# Patient Record
Sex: Female | Born: 1965 | Race: White | Hispanic: No | Marital: Single | Smoking: Current every day smoker
Health system: Southern US, Community
[De-identification: ages and names within clinical notes are randomized; demographics above are authoritative.]

## PROBLEM LIST (undated history)

## (undated) DIAGNOSIS — K2270 Barrett's esophagus without dysplasia: Secondary | ICD-10-CM

## (undated) DIAGNOSIS — R569 Unspecified convulsions: Secondary | ICD-10-CM

## (undated) DIAGNOSIS — I639 Cerebral infarction, unspecified: Secondary | ICD-10-CM

## (undated) DIAGNOSIS — R0600 Dyspnea, unspecified: Secondary | ICD-10-CM

## (undated) DIAGNOSIS — E559 Vitamin D deficiency, unspecified: Secondary | ICD-10-CM

## (undated) DIAGNOSIS — E785 Hyperlipidemia, unspecified: Secondary | ICD-10-CM

## (undated) DIAGNOSIS — R918 Other nonspecific abnormal finding of lung field: Secondary | ICD-10-CM

## (undated) DIAGNOSIS — N183 Chronic kidney disease, stage 3 unspecified: Secondary | ICD-10-CM

## (undated) DIAGNOSIS — N200 Calculus of kidney: Secondary | ICD-10-CM

## (undated) DIAGNOSIS — K219 Gastro-esophageal reflux disease without esophagitis: Secondary | ICD-10-CM

## (undated) DIAGNOSIS — M1990 Unspecified osteoarthritis, unspecified site: Secondary | ICD-10-CM

## (undated) DIAGNOSIS — Z87442 Personal history of urinary calculi: Secondary | ICD-10-CM

## (undated) HISTORY — DX: Vitamin D deficiency, unspecified: E55.9

## (undated) HISTORY — DX: Barrett's esophagus without dysplasia: K22.70

## (undated) HISTORY — PX: BREAST EXCISIONAL BIOPSY: SUR124

## (undated) HISTORY — DX: Chronic kidney disease, stage 3 unspecified: N18.30

## (undated) HISTORY — DX: Hyperlipidemia, unspecified: E78.5

## (undated) HISTORY — DX: Other nonspecific abnormal finding of lung field: R91.8

## (undated) HISTORY — DX: Gastro-esophageal reflux disease without esophagitis: K21.9

## (undated) HISTORY — DX: Cerebral infarction, unspecified: I63.9

## (undated) HISTORY — DX: Calculus of kidney: N20.0

## (undated) HISTORY — DX: Unspecified convulsions: R56.9

## (undated) HISTORY — PX: KNEE SURGERY: SHX244

## (undated) HISTORY — PX: HIP SURGERY: SHX245

---

## 2009-02-08 ENCOUNTER — Emergency Department (HOSPITAL_COMMUNITY): Admission: EM | Admit: 2009-02-08 | Discharge: 2009-02-08 | Payer: Self-pay | Admitting: Emergency Medicine

## 2014-12-09 DIAGNOSIS — R413 Other amnesia: Secondary | ICD-10-CM | POA: Insufficient documentation

## 2014-12-09 DIAGNOSIS — F411 Generalized anxiety disorder: Secondary | ICD-10-CM | POA: Insufficient documentation

## 2014-12-09 DIAGNOSIS — R569 Unspecified convulsions: Secondary | ICD-10-CM | POA: Insufficient documentation

## 2014-12-09 DIAGNOSIS — F3341 Major depressive disorder, recurrent, in partial remission: Secondary | ICD-10-CM | POA: Insufficient documentation

## 2014-12-09 DIAGNOSIS — F445 Conversion disorder with seizures or convulsions: Secondary | ICD-10-CM | POA: Insufficient documentation

## 2015-12-24 DIAGNOSIS — Z87898 Personal history of other specified conditions: Secondary | ICD-10-CM | POA: Insufficient documentation

## 2015-12-24 DIAGNOSIS — Z720 Tobacco use: Secondary | ICD-10-CM | POA: Insufficient documentation

## 2018-02-20 DIAGNOSIS — G43009 Migraine without aura, not intractable, without status migrainosus: Secondary | ICD-10-CM | POA: Insufficient documentation

## 2018-04-30 LAB — PULMONARY FUNCTION TEST

## 2020-02-10 ENCOUNTER — Encounter: Payer: Self-pay | Admitting: Family Medicine

## 2020-02-10 ENCOUNTER — Other Ambulatory Visit: Payer: Self-pay

## 2020-02-10 ENCOUNTER — Ambulatory Visit (INDEPENDENT_AMBULATORY_CARE_PROVIDER_SITE_OTHER): Payer: Medicare HMO | Admitting: Family Medicine

## 2020-02-10 VITALS — BP 117/69 | HR 95 | Temp 97.5°F | Ht 62.0 in | Wt 239.0 lb

## 2020-02-10 DIAGNOSIS — K219 Gastro-esophageal reflux disease without esophagitis: Secondary | ICD-10-CM | POA: Diagnosis not present

## 2020-02-10 DIAGNOSIS — F3341 Major depressive disorder, recurrent, in partial remission: Secondary | ICD-10-CM

## 2020-02-10 DIAGNOSIS — G43009 Migraine without aura, not intractable, without status migrainosus: Secondary | ICD-10-CM | POA: Diagnosis not present

## 2020-02-10 DIAGNOSIS — Z87898 Personal history of other specified conditions: Secondary | ICD-10-CM

## 2020-02-10 DIAGNOSIS — Z1211 Encounter for screening for malignant neoplasm of colon: Secondary | ICD-10-CM

## 2020-02-10 DIAGNOSIS — Z0000 Encounter for general adult medical examination without abnormal findings: Secondary | ICD-10-CM

## 2020-02-10 DIAGNOSIS — Z8673 Personal history of transient ischemic attack (TIA), and cerebral infarction without residual deficits: Secondary | ICD-10-CM | POA: Diagnosis not present

## 2020-02-10 DIAGNOSIS — Z720 Tobacco use: Secondary | ICD-10-CM

## 2020-02-10 MED ORDER — LAMOTRIGINE 200 MG PO TABS: 200.0000 mg | ORAL_TABLET | Freq: Every day | ORAL | 1 refills | Status: DC

## 2020-02-10 MED ORDER — OMEPRAZOLE 40 MG PO CPDR: 40.0000 mg | DELAYED_RELEASE_CAPSULE | Freq: Every day | ORAL | 1 refills | Status: DC

## 2020-02-10 MED ORDER — NORTRIPTYLINE HCL 50 MG PO CAPS: 100.0000 mg | ORAL_CAPSULE | Freq: Every day | ORAL | 1 refills | Status: DC

## 2020-02-10 MED ORDER — ATORVASTATIN CALCIUM 10 MG PO TABS: 10.0000 mg | ORAL_TABLET | Freq: Every day | ORAL | 1 refills | Status: DC

## 2020-02-10 MED ORDER — GABAPENTIN 300 MG PO CAPS: 300.0000 mg | ORAL_CAPSULE | Freq: Every day | ORAL | 1 refills | Status: DC

## 2020-02-10 NOTE — Progress Notes (Signed)
New Patient Office Visit  Assessment & Plan:  1. History of seizure - Discussed with patient that I could fill her medications as long as she was seizure free. If she has another seizure, she will need to return to the neurologist.  - lamoTRIgine (LAMICTAL) 200 MG tablet; Take 1 tablet (200 mg total) by mouth daily.  Dispense: 90 tablet; Refill: 1 - nortriptyline (PAMELOR) 50 MG capsule; Take 2 capsules (100 mg total) by mouth at bedtime.  Dispense: 180 capsule; Refill: 1 - Lamotrigine level - CBC with Differential/Platelet - CMP14+EGFR  2. Gastroesophageal reflux disease, unspecified whether esophagitis present - Well controlled on current regimen.  - omeprazole (PRILOSEC) 40 MG capsule; Take 1 capsule (40 mg total) by mouth daily.  Dispense: 90 capsule; Refill: 1  3. Migraine without aura and without status migrainosus, not intractable - Well controlled on current regimen.   4. Recurrent major depressive disorder, in partial remission ([place]) - Well controlled on current regimen.   5. History of stroke - Well controlled on current regimen.  - atorvastatin (LIPITOR) 10 MG tablet; Take 1 tablet (10 mg total) by mouth daily.  Dispense: 90 tablet; Refill: 1  6. Tobacco abuse - CMP14+EGFR - Lipid panel  7. Colon cancer screening - Cologuard  8. Healthcare maintenance - Patient would like to wait on TDAP as she may have had it done and it be in her records. Declined pap smear. She will call back to schedule her mammogram on the bus when she can coordinate transportation.    Follow-up: Return as directed after labs result.   Hendricks Limes, MSN, APRN, FNP-C [facility]  Subjective:  Patient ID: Maria Campbell, female    DOB: 01/22/1966  Age: 54 y.o. MRN: [id number]  Patient Care Team: Loman Brooklyn, FNP as PCP - General (Family Medicine)  CC:  Chief Complaint  Patient presents with  . New Patient (Initial Visit)    [facility]   . Establish  Care    HPI Kaylenn Civil presents to establish care. She is transferring care from [facility].   She would like to have seizure medications filled here instead of having to go to the neurologist all the time as she does not drive and has to pay her neighbor to take her places. She reports she has not had a seizure in at least 2 years.   Depression screen [facility] 2/9 02/10/2020  Decreased Interest 3  Down, Depressed, Hopeless 3  PHQ - 2 Score 6  Altered sleeping 2  Tired, decreased energy 0  Change in appetite 3  Feeling bad or failure about yourself  1  Trouble concentrating 3  Moving slowly or fidgety/restless 0  Suicidal thoughts 0  PHQ-9 Score 15  Difficult doing work/chores Not difficult at all    Review of Systems  Constitutional: Negative for chills, fever, malaise/fatigue and weight loss.  HENT: Negative for congestion, ear discharge, ear pain, nosebleeds, sinus pain, sore throat and tinnitus.   Eyes: Negative for blurred vision, double vision, pain, discharge and redness.  Respiratory: Negative for cough, shortness of breath and wheezing.   Cardiovascular: Negative for chest pain, palpitations and leg swelling.  Gastrointestinal: Negative for abdominal pain, constipation, diarrhea, heartburn, nausea and vomiting.  Genitourinary: Negative for dysuria, frequency and urgency.  Musculoskeletal: Negative for myalgias.  Skin: Negative for rash.  Neurological: Negative for dizziness, seizures, weakness and headaches.  Psychiatric/Behavioral: Negative for depression, substance abuse and suicidal ideas. The patient is not nervous/anxious.  Current Outpatient Medications:  .  aspirin 81 MG EC tablet, Take 1 tablet by mouth daily., Disp: , Rfl:  .  atorvastatin (LIPITOR) 10 MG tablet, Take 1 tablet (10 mg total) by mouth daily., Disp: 90 tablet, Rfl: 1 .  gabapentin (NEURONTIN) 300 MG capsule, Take 1 capsule (300 mg total) by mouth daily., Disp: 90 capsule, Rfl: 1 .   lamoTRIgine (LAMICTAL) 200 MG tablet, Take 1 tablet (200 mg total) by mouth daily., Disp: 90 tablet, Rfl: 1 .  nortriptyline (PAMELOR) 50 MG capsule, Take 2 capsules (100 mg total) by mouth at bedtime., Disp: 180 capsule, Rfl: 1 .  omeprazole (PRILOSEC) 40 MG capsule, Take 1 capsule (40 mg total) by mouth daily., Disp: 90 capsule, Rfl: 1 .  ondansetron (ZOFRAN) 4 MG tablet, Take 4 mg by mouth every 8 (eight) hours as needed for nausea or vomiting., Disp: , Rfl:   Allergies  Allergen Reactions  . Meperidine Nausea And Vomiting and Hives    Fainting and n/v (Demerol) Fainting and n/v (Demerol)   . Other Hives    Past Medical History:  Diagnosis Date  . Hyperlipidemia   . Kidney stones   . Seizures ([place])   . Stroke [facility])     Past Surgical History:  Procedure Laterality Date  . HIP SURGERY Left   . KNEE SURGERY Left     Family History  Problem Relation Age of Onset  . Heart disease Mother   . Heart disease Father   . Heart disease Maternal Grandmother   . Heart disease Maternal Grandfather   . Diabetes Paternal Grandmother     Social History   Socioeconomic History  . Marital status: Single    Spouse name: Not on file  . Number of children: Not on file  . Years of education: Not on file  . Highest education level: Not on file  Occupational History  . Not on file  Tobacco Use  . Smoking status: Current Every Day Smoker    Packs/day: 1.00    Types: Cigarettes  . Smokeless tobacco: Never Used  Substance and Sexual Activity  . Alcohol use: Never  . Drug use: Never  . Sexual activity: Not Currently    Birth control/protection: None  Other Topics Concern  . Not on file  Social History Narrative  . Not on file   Social Determinants of Health   Financial Resource Strain:   . Difficulty of Paying Living Expenses:   Food Insecurity:   . Worried About Charity fundraiser in the Last Year:   . Arboriculturist in the Last Year:   Transportation Needs:   . Lexicographer (Medical):   Marland Kitchen Lack of Transportation (Non-Medical):   Physical Activity:   . Days of Exercise per Week:   . Minutes of Exercise per Session:   Stress:   . Feeling of Stress :   Social Connections:   . Frequency of Communication with Friends and Family:   . Frequency of Social Gatherings with Friends and Family:   . Attends Religious Services:   . Active Member of Clubs or Organizations:   . Attends Archivist Meetings:   Marland Kitchen Marital Status:   Intimate Partner Violence:   . Fear of Current or Ex-Partner:   . Emotionally Abused:   Marland Kitchen Physically Abused:   . Sexually Abused:     Objective:   Today's Vitals: BP 117/69   Pulse 95   Temp (!) 97.5 F (  36.4 C) (Temporal)   Ht 5' 2"  (1.575 m)   Wt 239 lb (108.4 kg)   SpO2 96%   BMI 43.71 kg/m   Physical Exam Vitals reviewed.  Constitutional:      General: She is not in acute distress.    Appearance: Normal appearance. She is morbidly obese. She is not ill-appearing, toxic-appearing or diaphoretic.  HENT:     Head: Normocephalic and atraumatic.  Eyes:     General: No scleral icterus.       Right eye: No discharge.        Left eye: No discharge.     Conjunctiva/sclera: Conjunctivae normal.  Cardiovascular:     Rate and Rhythm: Normal rate and regular rhythm.     Heart sounds: Normal heart sounds. No murmur. No friction rub. No gallop.   Pulmonary:     Effort: Pulmonary effort is normal. No respiratory distress.     Breath sounds: Normal breath sounds. No stridor. No wheezing, rhonchi or rales.  Musculoskeletal:        General: Normal range of motion.     Cervical back: Normal range of motion.  Skin:    General: Skin is warm and dry.     Capillary Refill: Capillary refill takes less than 2 seconds.  Neurological:     General: No focal deficit present.     Mental Status: She is alert and oriented to person, place, and time. Mental status is at baseline.  Psychiatric:        Mood and Affect: Mood  normal.        Behavior: Behavior normal.        Thought Content: Thought content normal.        Judgment: Judgment normal.

## 2020-02-11 LAB — CBC WITH DIFFERENTIAL/PLATELET
Basophils Absolute: 0 10*3/uL (ref 0.0–0.2)
Basos: 1 %
EOS (ABSOLUTE): 0.1 10*3/uL (ref 0.0–0.4)
Eos: 2 %
Hematocrit: 41.4 % (ref 34.0–46.6)
Hemoglobin: 13 g/dL (ref 11.1–15.9)
Immature Grans (Abs): 0 10*3/uL (ref 0.0–0.1)
Immature Granulocytes: 0 %
Lymphocytes Absolute: 1.9 10*3/uL (ref 0.7–3.1)
Lymphs: 27 %
MCH: 24.3 pg — ABNORMAL LOW (ref 26.6–33.0)
MCHC: 31.4 g/dL — ABNORMAL LOW (ref 31.5–35.7)
MCV: 77 fL — ABNORMAL LOW (ref 79–97)
Monocytes Absolute: 0.4 10*3/uL (ref 0.1–0.9)
Monocytes: 6 %
Neutrophils Absolute: 4.5 10*3/uL (ref 1.4–7.0)
Neutrophils: 64 %
Platelets: 211 10*3/uL (ref 150–450)
RBC: 5.35 x10E6/uL — ABNORMAL HIGH (ref 3.77–5.28)
RDW: 16.7 % — ABNORMAL HIGH (ref 11.7–15.4)
WBC: 7 10*3/uL (ref 3.4–10.8)

## 2020-02-11 LAB — CMP14+EGFR
ALT: 11 IU/L (ref 0–32)
AST: 12 IU/L (ref 0–40)
Albumin/Globulin Ratio: 1.7 (ref 1.2–2.2)
Albumin: 4.2 g/dL (ref 3.8–4.9)
Alkaline Phosphatase: 114 IU/L (ref 39–117)
BUN/Creatinine Ratio: 11 (ref 9–23)
BUN: 13 mg/dL (ref 6–24)
Bilirubin Total: <0.2 mg/dL (ref 0.0–1.2)
CO2: 23 mmol/L (ref 20–29)
Calcium: 9.5 mg/dL (ref 8.7–10.2)
Chloride: 100 mmol/L (ref 96–106)
Creatinine, Ser: 1.14 mg/dL — ABNORMAL HIGH (ref 0.57–1.00)
GFR calc Af Amer: 63 mL/min/{1.73_m2} (ref >59)
GFR calc non Af Amer: 55 mL/min/{1.73_m2} — ABNORMAL LOW (ref >59)
Globulin, Total: 2.5 g/dL (ref 1.5–4.5)
Glucose: 113 mg/dL — ABNORMAL HIGH (ref 65–99)
Potassium: 3.8 mmol/L (ref 3.5–5.2)
Sodium: 138 mmol/L (ref 134–144)
Total Protein: 6.7 g/dL (ref 6.0–8.5)

## 2020-02-11 LAB — LIPID PANEL
Chol/HDL Ratio: 3.6 ratio (ref 0.0–4.4)
Cholesterol, Total: 176 mg/dL (ref 100–199)
HDL: 49 mg/dL (ref >39)
LDL Chol Calc (NIH): 103 mg/dL — ABNORMAL HIGH (ref 0–99)
Triglycerides: 136 mg/dL (ref 0–149)
VLDL Cholesterol Cal: 24 mg/dL (ref 5–40)

## 2020-02-11 LAB — LAMOTRIGINE LEVEL: Lamotrigine Lvl: 3.8 ug/mL (ref 2.0–20.0)

## 2020-04-07 ENCOUNTER — Encounter: Payer: Self-pay | Admitting: Family Medicine

## 2020-05-11 ENCOUNTER — Other Ambulatory Visit: Payer: Self-pay | Admitting: Family Medicine

## 2020-08-09 ENCOUNTER — Other Ambulatory Visit: Payer: Self-pay | Admitting: Family Medicine

## 2020-08-09 DIAGNOSIS — Z87898 Personal history of other specified conditions: Secondary | ICD-10-CM

## 2020-08-10 ENCOUNTER — Other Ambulatory Visit: Payer: Self-pay

## 2020-08-10 ENCOUNTER — Ambulatory Visit (INDEPENDENT_AMBULATORY_CARE_PROVIDER_SITE_OTHER): Payer: Medicare HMO | Admitting: Nurse Practitioner

## 2020-08-10 ENCOUNTER — Encounter: Payer: Self-pay | Admitting: Nurse Practitioner

## 2020-08-10 VITALS — BP 136/72 | HR 84 | Temp 96.9°F | Resp 20 | Ht 62.0 in | Wt 236.0 lb

## 2020-08-10 DIAGNOSIS — R300 Dysuria: Secondary | ICD-10-CM | POA: Insufficient documentation

## 2020-08-10 LAB — URINALYSIS, COMPLETE
Bilirubin, UA: NEGATIVE
Glucose, UA: NEGATIVE
Ketones, UA: NEGATIVE
Nitrite, UA: NEGATIVE
Specific Gravity, UA: 1.015 (ref 1.005–1.030)
Urobilinogen, Ur: 1 mg/dL (ref 0.2–1.0)
pH, UA: 7 (ref 5.0–7.5)

## 2020-08-10 LAB — MICROSCOPIC EXAMINATION
Bacteria, UA: NONE SEEN
WBC, UA: NONE SEEN /hpf (ref 0–5)

## 2020-08-10 MED ORDER — NITROFURANTOIN MONOHYD MACRO 100 MG PO CAPS: 100.0000 mg | ORAL_CAPSULE | Freq: Two times a day (BID) | ORAL | 0 refills | Status: DC

## 2020-08-10 NOTE — Progress Notes (Signed)
Acute Office Visit  Subjective:    Patient ID: Maria Campbell, female    DOB: 1965/12/22, 54 y.o.   MRN: [id number]  Chief Complaint  Patient presents with  . Urinary Tract Infection    dysuria, urinary frequency    Urinary Tract Infection  This is a recurrent problem. The current episode started more than 1 month ago. The problem occurs every urination. The problem has been unchanged. The pain is at a severity of 6/10. There has been no fever. She is not sexually active. There is a history of pyelonephritis. Associated symptoms include nausea and vomiting. Pertinent negatives include no chills, discharge, flank pain or hematuria. Associated symptoms comments: Dysuria. She has tried nothing for the symptoms. Her past medical history is significant for kidney stones.     Past Medical History:  Diagnosis Date  . Barrett's esophagus   . CKD (chronic kidney disease) stage 3, GFR 30-59 ml/min   . GERD (gastroesophageal reflux disease)   . Hyperlipidemia   . Kidney stones   . Lung mass    PET negative on 05/22/18  . Seizures ([facility])   . Stroke ([facility])   . Vitamin D deficiency     Past Surgical History:  Procedure Laterality Date  . HIP SURGERY Left   . KNEE SURGERY Left     Family History  Problem Relation Age of Onset  . Heart disease Mother   . Heart attack Mother   . Heart disease Father   . Heart failure Father   . Heart disease Maternal Grandmother   . Heart disease Maternal Grandfather   . Diabetes Paternal Grandmother     Social History   Socioeconomic History  . Marital status: Single    Spouse name: Not on file  . Number of children: Not on file  . Years of education: Not on file  . Highest education level: Not on file  Occupational History  . Not on file  Tobacco Use  . Smoking status: Current Every Day Smoker    Packs/day: 1.00    Types: Cigarettes  . Smokeless tobacco: Never Used  Vaping Use  . Vaping Use: Never used  Substance and Sexual Activity  .  Alcohol use: Never  . Drug use: Never  . Sexual activity: Not Currently    Birth control/protection: None  Other Topics Concern  . Not on file  Social History Narrative  . Not on file   Social Determinants of Health   Financial Resource Strain:   . Difficulty of Paying Living Expenses: Not on file  Food Insecurity:   . Worried About Programme researcher, broadcasting/film/video in the Last Year: Not on file  . Ran Out of Food in the Last Year: Not on file  Transportation Needs:   . Lack of Transportation (Medical): Not on file  . Lack of Transportation (Non-Medical): Not on file  Physical Activity:   . Days of Exercise per Week: Not on file  . Minutes of Exercise per Session: Not on file  Stress:   . Feeling of Stress : Not on file  Social Connections:   . Frequency of Communication with Friends and Family: Not on file  . Frequency of Social Gatherings with Friends and Family: Not on file  . Attends Religious Services: Not on file  . Active Member of Clubs or Organizations: Not on file  . Attends Banker Meetings: Not on file  . Marital Status: Not on file  Intimate Partner Violence:   .  Fear of Current or Ex-Partner: Not on file  . Emotionally Abused: Not on file  . Physically Abused: Not on file  . Sexually Abused: Not on file    Outpatient Medications Prior to Visit  Medication Sig Dispense Refill  . aspirin 81 MG EC tablet Take 1 tablet by mouth daily.    Marland Kitchen atorvastatin (LIPITOR) 10 MG tablet Take 1 tablet (10 mg total) by mouth daily. 90 tablet 1  . gabapentin (NEURONTIN) 300 MG capsule TAKE 1 CAPSULE(300 MG) BY MOUTH DAILY 90 capsule 1  . lamoTRIgine (LAMICTAL) 200 MG tablet Take 1 tablet (200 mg total) by mouth daily. (Needs to be seen before next refill) 30 tablet 0  . nortriptyline (PAMELOR) 50 MG capsule Take 2 capsules (100 mg total) by mouth at bedtime. (Needs to be seen before next refill) 60 capsule 0  . omeprazole (PRILOSEC) 40 MG capsule Take 1 capsule (40 mg total) by  mouth daily. 90 capsule 1  . ondansetron (ZOFRAN) 4 MG tablet Take 4 mg by mouth every 8 (eight) hours as needed for nausea or vomiting. (Patient not taking: Reported on 08/10/2020)     No facility-administered medications prior to visit.    Allergies  Allergen Reactions  . Meperidine Nausea And Vomiting and Hives    Fainting and n/v (Demerol) Fainting and n/v (Demerol)   . Other Hives    Review of Systems  Constitutional: Negative for chills.  Gastrointestinal: Positive for nausea and vomiting.  Genitourinary: Positive for dysuria. Negative for flank pain and hematuria.  All other systems reviewed and are negative.      Objective:    Physical Exam Constitutional:      Appearance: Normal appearance. She is obese.  HENT:     Head: Normocephalic.  Eyes:     Conjunctiva/sclera: Conjunctivae normal.  Cardiovascular:     Rate and Rhythm: Normal rate and regular rhythm.     Pulses: Normal pulses.     Heart sounds: Normal heart sounds.  Pulmonary:     Effort: Pulmonary effort is normal.     Breath sounds: Normal breath sounds.  Abdominal:     General: Bowel sounds are normal.  Genitourinary:    Vagina: No vaginal discharge.  Musculoskeletal:        General: Tenderness present.  Skin:    General: Skin is dry.     Findings: Rash present.  Neurological:     Mental Status: She is alert and oriented to person, place, and time.     BP 136/72   Pulse 84   Temp (!) 96.9 F (36.1 C) (Temporal)   Resp 20   Ht 5\' 2"  (1.575 m)   Wt 236 lb (107 kg)   SpO2 98%   BMI 43.16 kg/m  Wt Readings from Last 3 Encounters:  08/10/20 236 lb (107 kg)  02/10/20 239 lb (108.4 kg)    Health Maintenance Due  Topic Date Due  . Hepatitis C Screening  Never done  . COVID-19 Vaccine (1) Never done  . TETANUS/TDAP  Never done  . PAP SMEAR-Modifier  Never done  . MAMMOGRAM  Never done  . Fecal DNA (Cologuard)  Never done  . INFLUENZA VACCINE  Never done      No results found for:  TSH Lab Results  Component Value Date   WBC 7.0 02/10/2020   HGB 13.0 02/10/2020   HCT 41.4 02/10/2020   MCV 77 (L) 02/10/2020   PLT 211 02/10/2020   Lab Results  Component Value Date   NA 138 02/10/2020   K 3.8 02/10/2020   CO2 23 02/10/2020   GLUCOSE 113 (H) 02/10/2020   BUN 13 02/10/2020   CREATININE 1.14 (H) 02/10/2020   BILITOT <0.2 02/10/2020   ALKPHOS 114 02/10/2020   AST 12 02/10/2020   ALT 11 02/10/2020   PROT 6.7 02/10/2020   ALBUMIN 4.2 02/10/2020   CALCIUM 9.5 02/10/2020   Lab Results  Component Value Date   CHOL 176 02/10/2020   Lab Results  Component Value Date   HDL 49 02/10/2020   Lab Results  Component Value Date   LDLCALC 103 (H) 02/10/2020   Lab Results  Component Value Date   TRIG 136 02/10/2020   Lab Results  Component Value Date   CHOLHDL 3.6 02/10/2020   No results found for: HGBA1C     Assessment & Plan:  Dysuria Patient is a 54 year old female who presents to clinic with dysuria.  This is not new for patient but is been ongoing in the last 2 months.  Reports not coming to clinic because she was not having as much pain as she is having right now.  Patient reports a history of pyelonephritis and kidney stones in the past.  Patient is rating her pain today as 6-7 out of 10 on the pain scale of 0-10.  Patient is having difficulty giving a urine sample.  Patient denies blood in her urine, fever and foul odor urine.  Patient is not sexually active.  Reports nausea, vomiting one episode with stress incontinence.   Labs pending urinalysis/urine culture.  Problem List Items Addressed This Visit      Other   Dysuria - Primary   Relevant Orders   Urinalysis, Complete   Urine Culture       No orders of the defined types were placed in this encounter.    Daryll Drown, NP

## 2020-08-10 NOTE — Addendum Note (Signed)
Addended by: Daryll Drown on: 08/10/2020 10:38 AM   Modules accepted: Orders

## 2020-08-10 NOTE — Patient Instructions (Signed)

## 2020-08-10 NOTE — Assessment & Plan Note (Addendum)
Patient is a 54 year old female who presents to clinic with dysuria.  This is not new for patient but is been ongoing in the last 2 months.  Reports not coming to clinic because she was not having as much pain as she is having right now.  Patient reports a history of pyelonephritis and kidney stones in the past.  Patient is rating her pain today as 6-7 out of 10 on the pain scale of 0-10.  Patient is having difficulty giving a urine sample.  Patient denies blood in her urine, fever and foul odor urine.  Patient is not sexually active.  Reports nausea, vomiting one episode with stress incontinence.   Started patient on nitrofurantoin 100 mg twice daily pending urine cultures.  Labs pending urinalysis/urine culture.

## 2020-08-14 LAB — URINE CULTURE

## 2020-08-16 ENCOUNTER — Telehealth: Payer: Self-pay | Admitting: Family Medicine

## 2020-08-16 ENCOUNTER — Other Ambulatory Visit: Payer: Self-pay | Admitting: Nurse Practitioner

## 2020-08-16 MED ORDER — CIPROFLOXACIN HCL 500 MG PO TABS: 500.0000 mg | ORAL_TABLET | Freq: Two times a day (BID) | ORAL | 0 refills | Status: DC

## 2020-08-16 NOTE — Telephone Encounter (Signed)
Medication reordered and sent to [facility] pharmacy

## 2020-08-18 ENCOUNTER — Ambulatory Visit: Payer: Medicare HMO

## 2020-09-01 ENCOUNTER — Ambulatory Visit: Payer: Medicare HMO

## 2020-09-13 ENCOUNTER — Other Ambulatory Visit: Payer: Self-pay | Admitting: Family Medicine

## 2020-09-13 DIAGNOSIS — Z8673 Personal history of transient ischemic attack (TIA), and cerebral infarction without residual deficits: Secondary | ICD-10-CM

## 2020-09-14 ENCOUNTER — Other Ambulatory Visit: Payer: Self-pay | Admitting: Family Medicine

## 2020-09-14 DIAGNOSIS — Z8673 Personal history of transient ischemic attack (TIA), and cerebral infarction without residual deficits: Secondary | ICD-10-CM

## 2020-10-01 ENCOUNTER — Other Ambulatory Visit: Payer: Self-pay | Admitting: Family Medicine

## 2020-10-01 ENCOUNTER — Telehealth: Payer: Self-pay

## 2020-10-01 DIAGNOSIS — Z87898 Personal history of other specified conditions: Secondary | ICD-10-CM

## 2020-10-01 MED ORDER — NORTRIPTYLINE HCL 50 MG PO CAPS: 100.0000 mg | ORAL_CAPSULE | Freq: Every day | ORAL | 0 refills | Status: DC

## 2020-10-01 MED ORDER — LAMOTRIGINE 200 MG PO TABS: 200.0000 mg | ORAL_TABLET | Freq: Every day | ORAL | 0 refills | Status: DC

## 2020-10-01 NOTE — Telephone Encounter (Signed)
Appointment scheduled and refills sent.

## 2020-10-01 NOTE — Telephone Encounter (Signed)
  Prescription Request  10/01/2020  What is the name of the medication or equipment?  nortriptyline (PAMELOR) 50 MG capsule lamoTRIgine (LAMICTAL) 200 MG tablet   Have you contacted your pharmacy to request a refill? (if applicable) yes fax?  Which pharmacy would you like this sent to? [facility]   Patient notified that their request is being sent to the clinical staff for review and that they should receive a response within 2 business days.

## 2020-10-05 ENCOUNTER — Other Ambulatory Visit: Payer: Self-pay | Admitting: *Deleted

## 2020-10-05 DIAGNOSIS — K219 Gastro-esophageal reflux disease without esophagitis: Secondary | ICD-10-CM

## 2020-10-05 MED ORDER — OMEPRAZOLE 40 MG PO CPDR: 40.0000 mg | DELAYED_RELEASE_CAPSULE | Freq: Every day | ORAL | 0 refills | Status: DC

## 2020-10-08 ENCOUNTER — Encounter: Payer: Self-pay | Admitting: Nurse Practitioner

## 2020-10-08 ENCOUNTER — Other Ambulatory Visit: Payer: Self-pay

## 2020-10-08 ENCOUNTER — Ambulatory Visit (INDEPENDENT_AMBULATORY_CARE_PROVIDER_SITE_OTHER): Payer: Medicare HMO | Admitting: Nurse Practitioner

## 2020-10-08 VITALS — BP 120/81 | HR 93 | Temp 97.2°F | Ht 62.0 in | Wt 241.8 lb

## 2020-10-08 DIAGNOSIS — K219 Gastro-esophageal reflux disease without esophagitis: Secondary | ICD-10-CM

## 2020-10-08 DIAGNOSIS — Z87898 Personal history of other specified conditions: Secondary | ICD-10-CM | POA: Diagnosis not present

## 2020-10-08 DIAGNOSIS — F411 Generalized anxiety disorder: Secondary | ICD-10-CM

## 2020-10-08 DIAGNOSIS — E782 Mixed hyperlipidemia: Secondary | ICD-10-CM

## 2020-10-08 DIAGNOSIS — Z8673 Personal history of transient ischemic attack (TIA), and cerebral infarction without residual deficits: Secondary | ICD-10-CM

## 2020-10-08 LAB — COMPREHENSIVE METABOLIC PANEL
ALT: 13 IU/L (ref 0–32)
AST: 11 IU/L (ref 0–40)
Albumin/Globulin Ratio: 1.2 (ref 1.2–2.2)
Albumin: 3.6 g/dL — ABNORMAL LOW (ref 3.8–4.9)
Alkaline Phosphatase: 120 IU/L (ref 44–121)
BUN/Creatinine Ratio: 8 — ABNORMAL LOW (ref 9–23)
BUN: 10 mg/dL (ref 6–24)
Bilirubin Total: 0.2 mg/dL (ref 0.0–1.2)
CO2: 23 mmol/L (ref 20–29)
Calcium: 9.5 mg/dL (ref 8.7–10.2)
Chloride: 100 mmol/L (ref 96–106)
Creatinine, Ser: 1.21 mg/dL — ABNORMAL HIGH (ref 0.57–1.00)
GFR calc Af Amer: 58 mL/min/{1.73_m2} — ABNORMAL LOW (ref >59)
GFR calc non Af Amer: 50 mL/min/{1.73_m2} — ABNORMAL LOW (ref >59)
Globulin, Total: 3 g/dL (ref 1.5–4.5)
Glucose: 93 mg/dL (ref 65–99)
Potassium: 4.3 mmol/L (ref 3.5–5.2)
Sodium: 138 mmol/L (ref 134–144)
Total Protein: 6.6 g/dL (ref 6.0–8.5)

## 2020-10-08 LAB — LIPID PANEL
Chol/HDL Ratio: 4.7 ratio — ABNORMAL HIGH (ref 0.0–4.4)
Cholesterol, Total: 197 mg/dL (ref 100–199)
HDL: 42 mg/dL (ref >39)
LDL Chol Calc (NIH): 136 mg/dL — ABNORMAL HIGH (ref 0–99)
Triglycerides: 107 mg/dL (ref 0–149)
VLDL Cholesterol Cal: 19 mg/dL (ref 5–40)

## 2020-10-08 LAB — CBC WITH DIFFERENTIAL/PLATELET
Basophils Absolute: 0.1 10*3/uL (ref 0.0–0.2)
Basos: 1 %
EOS (ABSOLUTE): 0.2 10*3/uL (ref 0.0–0.4)
Eos: 3 %
Hematocrit: 38.1 % (ref 34.0–46.6)
Hemoglobin: 12.3 g/dL (ref 11.1–15.9)
Immature Grans (Abs): 0 10*3/uL (ref 0.0–0.1)
Immature Granulocytes: 0 %
Lymphocytes Absolute: 1.5 10*3/uL (ref 0.7–3.1)
Lymphs: 25 %
MCH: 25.4 pg — ABNORMAL LOW (ref 26.6–33.0)
MCHC: 32.3 g/dL (ref 31.5–35.7)
MCV: 79 fL (ref 79–97)
Monocytes Absolute: 0.5 10*3/uL (ref 0.1–0.9)
Monocytes: 8 %
Neutrophils Absolute: 3.9 10*3/uL (ref 1.4–7.0)
Neutrophils: 63 %
Platelets: 195 10*3/uL (ref 150–450)
RBC: 4.84 x10E6/uL (ref 3.77–5.28)
RDW: 15.5 % — ABNORMAL HIGH (ref 11.7–15.4)
WBC: 6.2 10*3/uL (ref 3.4–10.8)

## 2020-10-08 MED ORDER — ATORVASTATIN CALCIUM 10 MG PO TABS: 10.0000 mg | ORAL_TABLET | Freq: Every day | ORAL | 1 refills | Status: DC

## 2020-10-08 MED ORDER — NORTRIPTYLINE HCL 50 MG PO CAPS: ORAL_CAPSULE | ORAL | 1 refills | Status: DC

## 2020-10-08 MED ORDER — OMEPRAZOLE 40 MG PO CPDR: 40.0000 mg | DELAYED_RELEASE_CAPSULE | Freq: Every day | ORAL | 1 refills | Status: DC

## 2020-10-08 MED ORDER — GABAPENTIN 300 MG PO CAPS: ORAL_CAPSULE | ORAL | 1 refills | Status: DC

## 2020-10-08 MED ORDER — LAMOTRIGINE 200 MG PO TABS: 200.0000 mg | ORAL_TABLET | Freq: Every day | ORAL | 1 refills | Status: DC

## 2020-10-08 NOTE — Patient Instructions (Signed)
Generalized Anxiety Disorder, Adult Generalized anxiety disorder (GAD) is a mental health disorder. People with this condition constantly worry about everyday events. Unlike normal anxiety, worry related to GAD is not triggered by a specific event. These worries also do not fade or get better with time. GAD interferes with life functions, including relationships, work, and school. GAD can vary from mild to severe. People with severe GAD can have intense waves of anxiety with physical symptoms (panic attacks). What are the causes? The exact cause of GAD is not known. What increases the risk? This condition is more likely to develop in:  Women.  People who have a family history of anxiety disorders.  People who are very shy.  People who experience very stressful life events, such as the death of a loved one.  People who have a very stressful family environment. What are the signs or symptoms? People with GAD often worry excessively about many things in their lives, such as their health and family. They may also be overly concerned about:  Doing well at work.  Being on time.  Natural disasters.  Friendships. Physical symptoms of GAD include:  Fatigue.  Muscle tension or having muscle twitches.  Trembling or feeling shaky.  Being easily startled.  Feeling like your heart is pounding or racing.  Feeling out of breath or like you cannot take a deep breath.  Having trouble falling asleep or staying asleep.  Sweating.  Nausea, diarrhea, or irritable bowel syndrome (IBS).  Headaches.  Trouble concentrating or remembering facts.  Restlessness.  Irritability. How is this diagnosed? Your health care provider can diagnose GAD based on your symptoms and medical history. You will also have a physical exam. The health care provider will ask specific questions about your symptoms, including how severe they are, when they started, and if they come and go. Your health care  provider may ask you about your use of alcohol or drugs, including prescription medicines. Your health care provider may refer you to a mental health specialist for further evaluation. Your health care provider will do a thorough examination and may perform additional tests to rule out other possible causes of your symptoms. To be diagnosed with GAD, a person must have anxiety that:  Is out of his or her control.  Affects several different aspects of his or her life, such as work and relationships.  Causes distress that makes him or her unable to take part in normal activities.  Includes at least three physical symptoms of GAD, such as restlessness, fatigue, trouble concentrating, irritability, muscle tension, or sleep problems. Before your health care provider can confirm a diagnosis of GAD, these symptoms must be present more days than they are not, and they must last for six months or longer. How is this treated? The following therapies are usually used to treat GAD:  Medicine. Antidepressant medicine is usually prescribed for long-term daily control. Antianxiety medicines may be added in severe cases, especially when panic attacks occur.  Talk therapy (psychotherapy). Certain types of talk therapy can be helpful in treating GAD by providing support, education, and guidance. Options include: ? Cognitive behavioral therapy (CBT). People learn coping skills and techniques to ease their anxiety. They learn to identify unrealistic or negative thoughts and behaviors and to replace them with positive ones. ? Acceptance and commitment therapy (ACT). This treatment teaches people how to be mindful as a way to cope with unwanted thoughts and feelings. ? Biofeedback. This process trains you to manage your body's response (  physiological response) through breathing techniques and relaxation methods. You will work with a therapist while machines are used to monitor your physical symptoms.  Stress  management techniques. These include yoga, meditation, and exercise. A mental health specialist can help determine which treatment is best for you. Some people see improvement with one type of therapy. However, other people require a combination of therapies. Follow these instructions at home:  Take over-the-counter and prescription medicines only as told by your health care provider.  Try to maintain a normal routine.  Try to anticipate stressful situations and allow extra time to manage them.  Practice any stress management or self-calming techniques as taught by your health care provider.  Do not punish yourself for setbacks or for not making progress.  Try to recognize your accomplishments, even if they are small.  Keep all follow-up visits as told by your health care provider. This is important. Contact a health care provider if:  Your symptoms do not get better.  Your symptoms get worse.  You have signs of depression, such as: ? A persistently sad, cranky, or irritable mood. ? Loss of enjoyment in activities that used to bring you joy. ? Change in weight or eating. ? Changes in sleeping habits. ? Avoiding friends or family members. ? Loss of energy for normal tasks. ? Feelings of guilt or worthlessness. Get help right away if:  You have serious thoughts about hurting yourself or others. If you ever feel like you may hurt yourself or others, or have thoughts about taking your own life, get help right away. You can go to your nearest emergency department or call:  Your local emergency services (911 in the [place]  A suicide crisis helpline, such as the National Suicide Prevention Lifeline at 661-470-0329. This is open 24 hours a day. Summary  Generalized anxiety disorder (GAD) is a mental health disorder that involves worry that is not triggered by a specific event.  People with GAD often worry excessively about many things in their lives, such as their health and  family.  GAD may cause physical symptoms such as restlessness, trouble concentrating, sleep problems, frequent sweating, nausea, diarrhea, headaches, and trembling or muscle twitching.  A mental health specialist can help determine which treatment is best for you. Some people see improvement with one type of therapy. However, other people require a combination of therapies. This information is not intended to replace advice given to you by your health care provider. Make sure you discuss any questions you have with your health care provider. Document Revised: 10/19/2017 Document Reviewed: 09/26/2016 Elsevier Patient Education  2020 [employer] Gastroesophageal Reflux Disease, Adult Gastroesophageal reflux (GER) happens when acid from the stomach flows up into the tube that connects the mouth and the stomach (esophagus). Normally, food travels down the esophagus and stays in the stomach to be digested. With GER, food and stomach acid sometimes move back up into the esophagus. You may have a disease called gastroesophageal reflux disease (GERD) if the reflux:  Happens often.  Causes frequent or very bad symptoms.  Causes problems such as damage to the esophagus. When this happens, the esophagus becomes sore and swollen (inflamed). Over time, GERD can make small holes (ulcers) in the lining of the esophagus. What are the causes? This condition is caused by a problem with the muscle between the esophagus and the stomach. When this muscle is weak or not normal, it does not close properly to keep food and acid from coming back up from  the stomach. The muscle can be weak because of:  Tobacco use.  Pregnancy.  Having a certain type of hernia (hiatal hernia).  Alcohol use.  Certain foods and drinks, such as coffee, chocolate, onions, and peppermint. What increases the risk? You are more likely to develop this condition if you:  Are overweight.  Have a disease that affects your connective  tissue.  Use NSAID medicines. What are the signs or symptoms? Symptoms of this condition include:  Heartburn.  Difficult or painful swallowing.  The feeling of having a lump in the throat.  A bitter taste in the mouth.  Bad breath.  Having a lot of saliva.  Having an upset or bloated stomach.  Belching.  Chest pain. Different conditions can cause chest pain. Make sure you see your doctor if you have chest pain.  Shortness of breath or noisy breathing (wheezing).  Ongoing (chronic) cough or a cough at night.  Wearing away of the surface of teeth (tooth enamel).  Weight loss. How is this treated? Treatment will depend on how bad your symptoms are. Your doctor may suggest:  Changes to your diet.  Medicine.  Surgery. Follow these instructions at home: Eating and drinking   Follow a diet as told by your doctor. You may need to avoid foods and drinks such as: ? Coffee and tea (with or without caffeine). ? Drinks that contain alcohol. ? Energy drinks and sports drinks. ? Bubbly (carbonated) drinks or sodas. ? Chocolate and cocoa. ? Peppermint and mint flavorings. ? Garlic and onions. ? Horseradish. ? Spicy and acidic foods. These include peppers, chili powder, curry powder, vinegar, hot sauces, and BBQ sauce. ? Citrus fruit juices and citrus fruits, such as oranges, lemons, and limes. ? Tomato-based foods. These include red sauce, chili, salsa, and pizza with red sauce. ? Fried and fatty foods. These include donuts, french fries, potato chips, and high-fat dressings. ? High-fat meats. These include hot dogs, rib eye steak, sausage, ham, and bacon. ? High-fat dairy items, such as whole milk, butter, and cream cheese.  Eat small meals often. Avoid eating large meals.  Avoid drinking large amounts of liquid with your meals.  Avoid eating meals during the 2-3 hours before bedtime.  Avoid lying down right after you eat.  Do not exercise right after you  eat. Lifestyle   Do not use any products that contain nicotine or tobacco. These include cigarettes, e-cigarettes, and chewing tobacco. If you need help quitting, ask your doctor.  Try to lower your stress. If you need help doing this, ask your doctor.  If you are overweight, lose an amount of weight that is healthy for you. Ask your doctor about a safe weight loss goal. General instructions  Pay attention to any changes in your symptoms.  Take over-the-counter and prescription medicines only as told by your doctor. Do not take aspirin, ibuprofen, or other NSAIDs unless your doctor says it is okay.  Wear loose clothes. Do not wear anything tight around your waist.  Raise (elevate) the head of your bed about 6 inches (15 cm).  Avoid bending over if this makes your symptoms worse.  Keep all follow-up visits as told by your doctor. This is important. Contact a doctor if:  You have new symptoms.  You lose weight and you do not know why.  You have trouble swallowing or it hurts to swallow.  You have wheezing or a cough that keeps happening.  Your symptoms do not get better with treatment.  You have a hoarse voice. Get help right away if:  You have pain in your arms, neck, jaw, teeth, or back.  You feel sweaty, dizzy, or light-headed.  You have chest pain or shortness of breath.  You throw up (vomit) and your throw-up looks like blood or coffee grounds.  You pass out (faint).  Your poop (stool) is bloody or black.  You cannot swallow, drink, or eat. Summary  If a person has gastroesophageal reflux disease (GERD), food and stomach acid move back up into the esophagus and cause symptoms or problems such as damage to the esophagus.  Treatment will depend on how bad your symptoms are.  Follow a diet as told by your doctor.  Take all medicines only as told by your doctor. This information is not intended to replace advice given to you by your health care provider. Make  sure you discuss any questions you have with your health care provider. Document Revised: 05/15/2018 Document Reviewed: 05/15/2018 Elsevier Patient Education  2020 [employer] High Cholesterol  High cholesterol is a condition in which the blood has high levels of a white, waxy, fat-like substance (cholesterol). The human body needs small amounts of cholesterol. The liver makes all the cholesterol that the body needs. Extra (excess) cholesterol comes from the food that we eat. Cholesterol is carried from the liver by the blood through the blood vessels. If you have high cholesterol, deposits (plaques) may build up on the walls of your blood vessels (arteries). Plaques make the arteries narrower and stiffer. Cholesterol plaques increase your risk for heart attack and stroke. Work with your health care provider to keep your cholesterol levels in a healthy range. What increases the risk? This condition is more likely to develop in people who:  Eat foods that are high in animal fat (saturated fat) or cholesterol.  Are overweight.  Are not getting enough exercise.  Have a family history of high cholesterol. What are the signs or symptoms? There are no symptoms of this condition. How is this diagnosed? This condition may be diagnosed from the results of a blood test.  If you are older than age 29, your health care provider may check your cholesterol every 4-6 years.  You may be checked more often if you already have high cholesterol or other risk factors for heart disease. The blood test for cholesterol measures:  "Bad" cholesterol (LDL cholesterol). This is the main type of cholesterol that causes heart disease. The desired level for LDL is less than 100.  "Good" cholesterol (HDL cholesterol). This type helps to protect against heart disease by cleaning the arteries and carrying the LDL away. The desired level for HDL is 60 or higher.  Triglycerides. These are fats that the body can store  or burn for energy. The desired number for triglycerides is lower than 150.  Total cholesterol. This is a measure of the total amount of cholesterol in your blood, including LDL cholesterol, HDL cholesterol, and triglycerides. A healthy number is less than 200. How is this treated? This condition is treated with diet changes, lifestyle changes, and medicines. Diet changes  This may include eating more whole grains, fruits, vegetables, nuts, and fish.  This may also include cutting back on red meat and foods that have a lot of added sugar. Lifestyle changes  Changes may include getting at least 40 minutes of aerobic exercise 3 times a week. Aerobic exercises include walking, biking, and swimming. Aerobic exercise along with a healthy diet can help you  maintain a healthy weight.  Changes may also include quitting smoking. Medicines  Medicines are usually given if diet and lifestyle changes have failed to reduce your cholesterol to healthy levels.  Your health care provider may prescribe a statin medicine. Statin medicines have been shown to reduce cholesterol, which can reduce the risk of heart disease. Follow these instructions at home: Eating and drinking If told by your health care provider:  Eat chicken (without skin), fish, veal, shellfish, ground [place] breast, and round or loin cuts of red meat.  Do not eat fried foods or fatty meats, such as hot dogs and salami.  Eat plenty of fruits, such as apples.  Eat plenty of vegetables, such as broccoli, potatoes, and carrots.  Eat beans, peas, and lentils.  Eat grains such as barley, rice, couscous, and bulgur wheat.  Eat pasta without cream sauces.  Use skim or nonfat milk, and eat low-fat or nonfat yogurt and cheeses.  Do not eat or drink whole milk, cream, ice cream, egg yolks, or hard cheeses.  Do not eat stick margarine or tub margarines that contain trans fats (also called partially hydrogenated oils).  Do not eat  saturated tropical oils, such as coconut oil and palm oil.  Do not eat cakes, cookies, crackers, or other baked goods that contain trans fats.  General instructions  Exercise as directed by your health care provider. Increase your activity level with activities such as gardening, walking, and taking the stairs.  Take over-the-counter and prescription medicines only as told by your health care provider.  Do not use any products that contain nicotine or tobacco, such as cigarettes and e-cigarettes. If you need help quitting, ask your health care provider.  Keep all follow-up visits as told by your health care provider. This is important. Contact a health care provider if:  You are struggling to maintain a healthy diet or weight.  You need help to start on an exercise program.  You need help to stop smoking. Get help right away if:  You have chest pain.  You have trouble breathing. This information is not intended to replace advice given to you by your health care provider. Make sure you discuss any questions you have with your health care provider. Document Revised: 11/09/2017 Document Reviewed: 05/06/2016 [employer] Patient Education  2020 [employer]

## 2020-10-08 NOTE — Progress Notes (Signed)
Established Patient Office Visit  Subjective:  Patient ID: Maria Campbell, female    DOB: 10/10/1966  Age: 54 y.o. MRN: [id number]  CC:  Chief Complaint  Patient presents with  . Hyperlipidemia    check up of chronic medical conditions    HPI Maria Campbell presents for Mixed hyperlipidemia. Patient was diagnosed a few months ago. Compliance with treatment has been fair; The patient is compliant with medications, maintains a low cholesterol diet , follows up as directed , and maintains an exercise regimen, as tolerated . The patient denies experiencing any hypercholesterolemia related symptoms.  Current medication Lipitor 10 mg tablet by mouth daily.  Anxiety: Patient complains of anxiety disorder.  She has the following symptoms: fatigue, irritable. Onset of symptoms was approximately 4 years ago, rapidly improving since that time. She denies current suicidal and homicidal ideation. Family history significant for no psychiatric illness.Possible organic causes contributing are: none. Risk factors: previous episode of depression Previous treatment includes Lamictal.  She complains of the following side effects from the treatment: none.   -----------------------------------------------------------------------------------------   Past Medical History:  Diagnosis Date  . Barrett's esophagus   . CKD (chronic kidney disease) stage 3, GFR 30-59 ml/min (HCC)   . GERD (gastroesophageal reflux disease)   . Hyperlipidemia   . Kidney stones   . Lung mass    PET negative on 05/22/18  . Seizures (HCC)   . Stroke (HCC)   . Vitamin D deficiency     Past Surgical History:  Procedure Laterality Date  . HIP SURGERY Left   . KNEE SURGERY Left     Family History  Problem Relation Age of Onset  . Heart disease Mother   . Heart attack Mother   . Heart disease Father   . Heart failure Father   . Heart disease Maternal Grandmother   . Heart disease Maternal Grandfather   . Diabetes Paternal  Grandmother     Social History   Socioeconomic History  . Marital status: Single    Spouse name: Not on file  . Number of children: Not on file  . Years of education: Not on file  . Highest education level: Not on file  Occupational History  . Not on file  Tobacco Use  . Smoking status: Current Every Day Smoker    Packs/day: 1.00    Types: Cigarettes  . Smokeless tobacco: Never Used  Vaping Use  . Vaping Use: Never used  Substance and Sexual Activity  . Alcohol use: Never  . Drug use: Never  . Sexual activity: Not Currently    Birth control/protection: None  Other Topics Concern  . Not on file  Social History Narrative  . Not on file   Social Determinants of Health   Financial Resource Strain:   . Difficulty of Paying Living Expenses: Not on file  Food Insecurity:   . Worried About Programme researcher, broadcasting/film/video in the Last Year: Not on file  . Ran Out of Food in the Last Year: Not on file  Transportation Needs:   . Lack of Transportation (Medical): Not on file  . Lack of Transportation (Non-Medical): Not on file  Physical Activity:   . Days of Exercise per Week: Not on file  . Minutes of Exercise per Session: Not on file  Stress:   . Feeling of Stress : Not on file  Social Connections:   . Frequency of Communication with Friends and Family: Not on file  . Frequency of Social Gatherings with  Friends and Family: Not on file  . Attends Religious Services: Not on file  . Active Member of Clubs or Organizations: Not on file  . Attends Banker Meetings: Not on file  . Marital Status: Not on file  Intimate Partner Violence:   . Fear of Current or Ex-Partner: Not on file  . Emotionally Abused: Not on file  . Physically Abused: Not on file  . Sexually Abused: Not on file    Outpatient Medications Prior to Visit  Medication Sig Dispense Refill  . aspirin 81 MG EC tablet Take 1 tablet by mouth daily.    . ondansetron (ZOFRAN) 4 MG tablet Take 4 mg by mouth every  8 (eight) hours as needed for nausea or vomiting.     Marland Kitchen atorvastatin (LIPITOR) 10 MG tablet Take 1 tablet (10 mg total) by mouth daily. (Needs to be seen before next refill) 30 tablet 0  . gabapentin (NEURONTIN) 300 MG capsule TAKE 1 CAPSULE(300 MG) BY MOUTH DAILY 90 capsule 1  . lamoTRIgine (LAMICTAL) 200 MG tablet TAKE 1 TABLET BY MOUTH ONCE DAILY. 30 tablet 0  . nortriptyline (PAMELOR) 50 MG capsule TAKE 2 CAPSULES NIGHTLY. 60 capsule 0  . omeprazole (PRILOSEC) 40 MG capsule Take 1 capsule (40 mg total) by mouth daily. 90 capsule 0  . ciprofloxacin (CIPRO) 500 MG tablet Take 1 tablet (500 mg total) by mouth 2 (two) times daily. 14 tablet 0  . nitrofurantoin, macrocrystal-monohydrate, (MACROBID) 100 MG capsule Take 1 capsule (100 mg total) by mouth 2 (two) times daily. 1 po BId 14 capsule 0   No facility-administered medications prior to visit.    Allergies  Allergen Reactions  . Meperidine Nausea And Vomiting and Hives    Fainting and n/v (Demerol) Fainting and n/v (Demerol)   . Other Hives    ROS Review of Systems  Neurological: Negative for light-headedness, numbness and headaches.  Psychiatric/Behavioral: Negative for self-injury, sleep disturbance and suicidal ideas. The patient is not nervous/anxious.   All other systems reviewed and are negative.     Objective:    Physical Exam Vitals reviewed.  Constitutional:      Appearance: She is obese.  HENT:     Head: Normocephalic.  Eyes:     Conjunctiva/sclera: Conjunctivae normal.  Cardiovascular:     Rate and Rhythm: Normal rate.     Pulses: Normal pulses.     Heart sounds: Normal heart sounds.  Pulmonary:     Effort: Pulmonary effort is normal.     Breath sounds: Normal breath sounds.  Abdominal:     General: Bowel sounds are normal.  Musculoskeletal:        General: No tenderness.  Skin:    General: Skin is warm.  Neurological:     Mental Status: She is alert and oriented to person, place, and time.    Psychiatric:     Comments: Anxiety     BP 120/81   Pulse 93   Temp (!) 97.2 F (36.2 C) (Temporal)   Ht 5\' 2"  (1.575 m)   Wt 241 lb 12.8 oz (109.7 kg)   SpO2 100%   BMI 44.23 kg/m  Wt Readings from Last 3 Encounters:  10/08/20 241 lb 12.8 oz (109.7 kg)  08/10/20 236 lb (107 kg)  02/10/20 239 lb (108.4 kg)     Health Maintenance Due  Topic Date Due  . MAMMOGRAM  Never done    There are no preventive care reminders to display for this patient.  No results found for: TSH Lab Results  Component Value Date   WBC 7.0 02/10/2020   HGB 13.0 02/10/2020   HCT 41.4 02/10/2020   MCV 77 (L) 02/10/2020   PLT 211 02/10/2020   Lab Results  Component Value Date   NA 138 02/10/2020   K 3.8 02/10/2020   CO2 23 02/10/2020   GLUCOSE 113 (H) 02/10/2020   BUN 13 02/10/2020   CREATININE 1.14 (H) 02/10/2020   BILITOT <0.2 02/10/2020   ALKPHOS 114 02/10/2020   AST 12 02/10/2020   ALT 11 02/10/2020   PROT 6.7 02/10/2020   ALBUMIN 4.2 02/10/2020   CALCIUM 9.5 02/10/2020   Lab Results  Component Value Date   CHOL 176 02/10/2020   Lab Results  Component Value Date   HDL 49 02/10/2020   Lab Results  Component Value Date   LDLCALC 103 (H) 02/10/2020   Lab Results  Component Value Date   TRIG 136 02/10/2020   Lab Results  Component Value Date   CHOLHDL 3.6 02/10/2020   No results found for: HGBA1C    Assessment & Plan:   Problem List Items Addressed This Visit      Other   Generalized anxiety disorder    Anxiety well controlled on current medication no changes to current dose. Continue to provide education with printed handouts given. Rx refill sent to pharmacy. Follow-up with worsening or unresolved symptoms.      Relevant Medications   nortriptyline (PAMELOR) 50 MG capsule   History of seizure   Relevant Medications   lamoTRIgine (LAMICTAL) 200 MG tablet   nortriptyline (PAMELOR) 50 MG capsule   Mixed hyperlipidemia - Primary    Hyperlipidemia  well-controlled on current medication.  No changes to dose.  Patient denies any hypercholesterolemia symptoms.  Labs completed lipid, CMP CBC.  Results pending. Provided education with printed handouts given. Rx refill sent to pharmacy Follow-up in 3 months.      Relevant Medications   atorvastatin (LIPITOR) 10 MG tablet   Other Relevant Orders   CBC with Differential   Comprehensive metabolic panel   Lipid Panel    Other Visit Diagnoses    Gastroesophageal reflux disease, unspecified whether esophagitis present       Relevant Medications   omeprazole (PRILOSEC) 40 MG capsule   History of stroke       Relevant Medications   atorvastatin (LIPITOR) 10 MG tablet      Meds ordered this encounter  Medications  . omeprazole (PRILOSEC) 40 MG capsule    Sig: Take 1 capsule (40 mg total) by mouth daily.    Dispense:  90 capsule    Refill:  1  . lamoTRIgine (LAMICTAL) 200 MG tablet    Sig: Take 1 tablet (200 mg total) by mouth daily.    Dispense:  90 tablet    Refill:  1  . nortriptyline (PAMELOR) 50 MG capsule    Sig: TAKE 2 CAPSULES NIGHTLY.    Dispense:  180 capsule    Refill:  1  . atorvastatin (LIPITOR) 10 MG tablet    Sig: Take 1 tablet (10 mg total) by mouth daily.    Dispense:  90 tablet    Refill:  1  . gabapentin (NEURONTIN) 300 MG capsule    Sig: TAKE 1 CAPSULE(300 MG) BY MOUTH DAILY    Dispense:  90 capsule    Refill:  1    Follow-up: Return in about 6 months (around 04/07/2021).    Daryll Drown,  NP

## 2020-10-08 NOTE — Assessment & Plan Note (Signed)
Hyperlipidemia well-controlled on current medication.  No changes to dose.  Patient denies any hypercholesterolemia symptoms.  Labs completed lipid, CMP CBC.  Results pending. Provided education with printed handouts given. Rx refill sent to pharmacy Follow-up in 3 months.

## 2020-10-08 NOTE — Assessment & Plan Note (Signed)
Anxiety well controlled on current medication no changes to current dose. Continue to provide education with printed handouts given. Rx refill sent to pharmacy. Follow-up with worsening or unresolved symptoms.

## 2020-10-15 DIAGNOSIS — N209 Urinary calculus, unspecified: Secondary | ICD-10-CM | POA: Insufficient documentation

## 2020-10-16 DIAGNOSIS — K219 Gastro-esophageal reflux disease without esophagitis: Secondary | ICD-10-CM | POA: Insufficient documentation

## 2020-10-16 DIAGNOSIS — I639 Cerebral infarction, unspecified: Secondary | ICD-10-CM | POA: Insufficient documentation

## 2020-10-16 DIAGNOSIS — E559 Vitamin D deficiency, unspecified: Secondary | ICD-10-CM | POA: Insufficient documentation

## 2020-10-16 DIAGNOSIS — N183 Chronic kidney disease, stage 3 unspecified: Secondary | ICD-10-CM | POA: Insufficient documentation

## 2020-10-27 ENCOUNTER — Other Ambulatory Visit: Payer: Self-pay | Admitting: Family Medicine

## 2020-10-27 DIAGNOSIS — K219 Gastro-esophageal reflux disease without esophagitis: Secondary | ICD-10-CM

## 2020-11-15 ENCOUNTER — Other Ambulatory Visit: Payer: Self-pay | Admitting: Family Medicine

## 2020-11-15 DIAGNOSIS — Z87898 Personal history of other specified conditions: Secondary | ICD-10-CM

## 2020-11-15 DIAGNOSIS — Z8673 Personal history of transient ischemic attack (TIA), and cerebral infarction without residual deficits: Secondary | ICD-10-CM

## 2020-11-23 DIAGNOSIS — N201 Calculus of ureter: Secondary | ICD-10-CM | POA: Diagnosis not present

## 2020-11-23 DIAGNOSIS — Q625 Duplication of ureter: Secondary | ICD-10-CM | POA: Diagnosis not present

## 2020-11-23 DIAGNOSIS — Z885 Allergy status to narcotic agent status: Secondary | ICD-10-CM | POA: Diagnosis not present

## 2020-11-23 DIAGNOSIS — N3000 Acute cystitis without hematuria: Secondary | ICD-10-CM | POA: Diagnosis not present

## 2020-11-23 DIAGNOSIS — Z7982 Long term (current) use of aspirin: Secondary | ICD-10-CM | POA: Diagnosis not present

## 2020-11-23 DIAGNOSIS — R112 Nausea with vomiting, unspecified: Secondary | ICD-10-CM | POA: Diagnosis not present

## 2020-11-23 DIAGNOSIS — Z79899 Other long term (current) drug therapy: Secondary | ICD-10-CM | POA: Diagnosis not present

## 2020-11-23 DIAGNOSIS — R109 Unspecified abdominal pain: Secondary | ICD-10-CM | POA: Diagnosis not present

## 2020-11-23 DIAGNOSIS — I700 Atherosclerosis of aorta: Secondary | ICD-10-CM | POA: Diagnosis not present

## 2020-11-23 DIAGNOSIS — I959 Hypotension, unspecified: Secondary | ICD-10-CM | POA: Diagnosis not present

## 2020-11-23 DIAGNOSIS — N202 Calculus of kidney with calculus of ureter: Secondary | ICD-10-CM | POA: Diagnosis not present

## 2020-11-23 DIAGNOSIS — R52 Pain, unspecified: Secondary | ICD-10-CM | POA: Diagnosis not present

## 2020-11-23 DIAGNOSIS — G40909 Epilepsy, unspecified, not intractable, without status epilepticus: Secondary | ICD-10-CM | POA: Diagnosis not present

## 2020-11-23 DIAGNOSIS — R69 Illness, unspecified: Secondary | ICD-10-CM | POA: Diagnosis not present

## 2020-11-23 DIAGNOSIS — N200 Calculus of kidney: Secondary | ICD-10-CM | POA: Diagnosis not present

## 2020-11-23 DIAGNOSIS — T83192A Other mechanical complication of urinary stent, initial encounter: Secondary | ICD-10-CM | POA: Diagnosis not present

## 2020-11-23 DIAGNOSIS — R58 Hemorrhage, not elsewhere classified: Secondary | ICD-10-CM | POA: Diagnosis not present

## 2021-01-24 ENCOUNTER — Telehealth: Payer: Self-pay

## 2021-03-17 ENCOUNTER — Ambulatory Visit (INDEPENDENT_AMBULATORY_CARE_PROVIDER_SITE_OTHER): Payer: Medicare HMO | Admitting: Family Medicine

## 2021-03-17 ENCOUNTER — Encounter: Payer: Self-pay | Admitting: Family Medicine

## 2021-03-17 ENCOUNTER — Other Ambulatory Visit: Payer: Self-pay

## 2021-03-17 VITALS — BP 119/86 | HR 92 | Temp 97.3°F | Ht 62.0 in | Wt 230.4 lb

## 2021-03-17 DIAGNOSIS — E559 Vitamin D deficiency, unspecified: Secondary | ICD-10-CM | POA: Diagnosis not present

## 2021-03-17 DIAGNOSIS — K219 Gastro-esophageal reflux disease without esophagitis: Secondary | ICD-10-CM | POA: Diagnosis not present

## 2021-03-17 DIAGNOSIS — F3341 Major depressive disorder, recurrent, in partial remission: Secondary | ICD-10-CM

## 2021-03-17 DIAGNOSIS — N1832 Chronic kidney disease, stage 3b: Secondary | ICD-10-CM | POA: Diagnosis not present

## 2021-03-17 DIAGNOSIS — R69 Illness, unspecified: Secondary | ICD-10-CM | POA: Diagnosis not present

## 2021-03-17 DIAGNOSIS — G43009 Migraine without aura, not intractable, without status migrainosus: Secondary | ICD-10-CM | POA: Diagnosis not present

## 2021-03-17 DIAGNOSIS — E782 Mixed hyperlipidemia: Secondary | ICD-10-CM

## 2021-03-17 DIAGNOSIS — Z0000 Encounter for general adult medical examination without abnormal findings: Secondary | ICD-10-CM

## 2021-03-17 DIAGNOSIS — Z8673 Personal history of transient ischemic attack (TIA), and cerebral infarction without residual deficits: Secondary | ICD-10-CM

## 2021-03-17 DIAGNOSIS — Z87898 Personal history of other specified conditions: Secondary | ICD-10-CM

## 2021-03-17 DIAGNOSIS — F411 Generalized anxiety disorder: Secondary | ICD-10-CM

## 2021-03-17 DIAGNOSIS — N200 Calculus of kidney: Secondary | ICD-10-CM

## 2021-03-17 MED ORDER — NORTRIPTYLINE HCL 50 MG PO CAPS: ORAL_CAPSULE | ORAL | 1 refills | Status: DC

## 2021-03-17 MED ORDER — ATORVASTATIN CALCIUM 10 MG PO TABS: 10.0000 mg | ORAL_TABLET | Freq: Every day | ORAL | 1 refills | Status: DC

## 2021-03-17 MED ORDER — GABAPENTIN 300 MG PO CAPS: ORAL_CAPSULE | ORAL | 1 refills | Status: DC

## 2021-03-17 MED ORDER — LAMOTRIGINE 200 MG PO TABS
200.0000 mg | ORAL_TABLET | Freq: Every day | ORAL | 1 refills | Status: DC
Start: 2021-03-17 — End: 2021-10-03

## 2021-03-17 MED ORDER — OMEPRAZOLE 40 MG PO CPDR: 40.0000 mg | DELAYED_RELEASE_CAPSULE | Freq: Every day | ORAL | 1 refills | Status: DC

## 2021-03-18 LAB — CMP14+EGFR
ALT: 7 IU/L (ref 0–32)
AST: 10 IU/L (ref 0–40)
Albumin/Globulin Ratio: 1.3 (ref 1.2–2.2)
Albumin: 3.9 g/dL (ref 3.8–4.9)
Alkaline Phosphatase: 107 IU/L (ref 44–121)
BUN/Creatinine Ratio: 10 (ref 9–23)
BUN: 14 mg/dL (ref 6–24)
Bilirubin Total: <0.2 mg/dL (ref 0.0–1.2)
CO2: 19 mmol/L — ABNORMAL LOW (ref 20–29)
Calcium: 9.5 mg/dL (ref 8.7–10.2)
Chloride: 104 mmol/L (ref 96–106)
Creatinine, Ser: 1.39 mg/dL — ABNORMAL HIGH (ref 0.57–1.00)
Globulin, Total: 3 g/dL (ref 1.5–4.5)
Glucose: 88 mg/dL (ref 65–99)
Potassium: 4.1 mmol/L (ref 3.5–5.2)
Sodium: 140 mmol/L (ref 134–144)
Total Protein: 6.9 g/dL (ref 6.0–8.5)
eGFR: 45 mL/min/{1.73_m2} — ABNORMAL LOW (ref >59)

## 2021-03-18 LAB — LIPID PANEL
Chol/HDL Ratio: 4.9 ratio — ABNORMAL HIGH (ref 0.0–4.4)
Cholesterol, Total: 196 mg/dL (ref 100–199)
HDL: 40 mg/dL (ref >39)
LDL Chol Calc (NIH): 127 mg/dL — ABNORMAL HIGH (ref 0–99)
Triglycerides: 164 mg/dL — ABNORMAL HIGH (ref 0–149)
VLDL Cholesterol Cal: 29 mg/dL (ref 5–40)

## 2021-03-18 LAB — CBC WITH DIFFERENTIAL/PLATELET
Basophils Absolute: 0.1 10*3/uL (ref 0.0–0.2)
Basos: 1 %
EOS (ABSOLUTE): 0.3 10*3/uL (ref 0.0–0.4)
Eos: 5 %
Hematocrit: 37.4 % (ref 34.0–46.6)
Hemoglobin: 11.5 g/dL (ref 11.1–15.9)
Immature Grans (Abs): 0 10*3/uL (ref 0.0–0.1)
Immature Granulocytes: 0 %
Lymphocytes Absolute: 1.9 10*3/uL (ref 0.7–3.1)
Lymphs: 34 %
MCH: 24.6 pg — ABNORMAL LOW (ref 26.6–33.0)
MCHC: 30.7 g/dL — ABNORMAL LOW (ref 31.5–35.7)
MCV: 80 fL (ref 79–97)
Monocytes Absolute: 0.5 10*3/uL (ref 0.1–0.9)
Monocytes: 8 %
Neutrophils Absolute: 2.9 10*3/uL (ref 1.4–7.0)
Neutrophils: 52 %
Platelets: 200 10*3/uL (ref 150–450)
RBC: 4.68 x10E6/uL (ref 3.77–5.28)
RDW: 15.8 % — ABNORMAL HIGH (ref 11.7–15.4)
WBC: 5.6 10*3/uL (ref 3.4–10.8)

## 2021-03-18 LAB — VITAMIN D 25 HYDROXY (VIT D DEFICIENCY, FRACTURES): Vit D, 25-Hydroxy: 20.1 ng/mL — ABNORMAL LOW (ref 30.0–100.0)

## 2021-03-19 ENCOUNTER — Encounter: Payer: Self-pay | Admitting: Family Medicine

## 2021-03-19 DIAGNOSIS — Z8673 Personal history of transient ischemic attack (TIA), and cerebral infarction without residual deficits: Secondary | ICD-10-CM | POA: Insufficient documentation
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
## 2021-03-20 ENCOUNTER — Other Ambulatory Visit: Payer: Self-pay | Admitting: Family Medicine

## 2021-03-20 DIAGNOSIS — Z8673 Personal history of transient ischemic attack (TIA), and cerebral infarction without residual deficits: Secondary | ICD-10-CM

## 2021-03-20 MED ORDER — ATORVASTATIN CALCIUM 20 MG PO TABS: 20.0000 mg | ORAL_TABLET | Freq: Every day | ORAL | 2 refills | Status: DC

## 2021-04-04 ENCOUNTER — Encounter: Payer: Self-pay | Admitting: Urology

## 2021-04-04 ENCOUNTER — Other Ambulatory Visit: Payer: Self-pay

## 2021-04-04 ENCOUNTER — Ambulatory Visit (HOSPITAL_COMMUNITY)
Admission: RE | Admit: 2021-04-04 | Discharge: 2021-04-04 | Disposition: A | Discharge status: Home / Self Care | Payer: Medicare HMO | Source: Ambulatory Visit | Attending: Urology | Admitting: Urology

## 2021-04-04 ENCOUNTER — Ambulatory Visit (INDEPENDENT_AMBULATORY_CARE_PROVIDER_SITE_OTHER): Payer: Medicare HMO | Admitting: Urology

## 2021-04-04 VITALS — BP 125/84 | HR 99 | Temp 98.5°F | Ht 62.0 in | Wt 230.0 lb

## 2021-04-04 DIAGNOSIS — N201 Calculus of ureter: Secondary | ICD-10-CM | POA: Diagnosis not present

## 2021-04-04 DIAGNOSIS — Z96642 Presence of left artificial hip joint: Secondary | ICD-10-CM | POA: Diagnosis not present

## 2021-04-04 DIAGNOSIS — M47816 Spondylosis without myelopathy or radiculopathy, lumbar region: Secondary | ICD-10-CM | POA: Diagnosis not present

## 2021-04-04 DIAGNOSIS — Z960 Presence of urogenital implants: Secondary | ICD-10-CM | POA: Diagnosis not present

## 2021-04-04 DIAGNOSIS — N200 Calculus of kidney: Secondary | ICD-10-CM | POA: Diagnosis not present

## 2021-04-04 LAB — URINALYSIS, ROUTINE W REFLEX MICROSCOPIC
Bilirubin, UA: NEGATIVE
Glucose, UA: NEGATIVE
Ketones, UA: NEGATIVE
Nitrite, UA: POSITIVE — AB
Specific Gravity, UA: 1.02 (ref 1.005–1.030)
Urobilinogen, Ur: 0.2 mg/dL (ref 0.2–1.0)
pH, UA: 7 (ref 5.0–7.5)

## 2021-04-04 MED ORDER — NITROFURANTOIN MONOHYD MACRO 100 MG PO CAPS: 100.0000 mg | ORAL_CAPSULE | Freq: Two times a day (BID) | ORAL | 0 refills | Status: DC

## 2021-04-04 NOTE — Progress Notes (Signed)
Urological Symptom Review  Patient is experiencing the following symptoms: Frequent urination Hard to postpone urination Burning/pain with urination Get up at night to urinate Leakage of urine Stream starts and stops Weak stream Kidney stones  Review of Systems  Gastrointestinal (upper)  : Negative for upper GI symptoms  Gastrointestinal (lower) : Negative for lower GI symptoms  Constitutional : Negative for symptoms  Skin: Negative for skin symptoms  Eyes: Negative for eye symptoms  Ear/Nose/Throat : Negative for Ear/Nose/Throat symptoms  Hematologic/Lymphatic: Negative for Hematologic/Lymphatic symptoms  Cardiovascular : Negative for cardiovascular symptoms  Respiratory : Negative for respiratory symptoms  Endocrine: Negative for endocrine symptoms  Musculoskeletal: Negative for musculoskeletal symptoms  Neurological: Headaches Dizziness  Psychologic: Anxiety

## 2021-04-04 NOTE — Progress Notes (Signed)
04/04/2021 2:59 PM   Maria Campbell 06/02/1966 [id number]  Referring provider: Gwenlyn Fudge, FNP [address] [place],  [place] [address]  CC: ureteral stone   HPI:  New patient-  1) renal and ureteral stone - Maria Campbell is a 55 year old female who underwent urgent stent 10/15/2020 with Dr. Huel Cote  for UTI ( e coli - mostly sensitive) and a 3 mm left distal ureteral stone on CT.  She also had a 7 mm right lower pole stone and a 5 mm left midpole stone.  She did not follow-up.  She developed abdominal pain and a 11/23/2020 CT scan of the abdomen and pelvis showed retained left ureteral stent with distal 4 mm left stone still present and 5 mm left renal stone.  Stent did not appear encrusted. The right 7 mm stone was at the [facility], no hydro. Urine cx mixed flora. She reports she passed a stone but has had pain on right and left and not sure where it's from.   She did not follow-up. She has transportation issues. She has frequency and urgency. UA clear. I sent urine for cx.   No blood thinner. H/o seizures.   Addendum: KUB done after she left and unofficial read with stable right renal pelvic stone.  Left stent in place, good position.  No obvious encrustation.  Likely left distal stone adjacent to the stent.  PMH: Past Medical History:  Diagnosis Date  . Barrett's esophagus   . CKD (chronic kidney disease) stage 3, GFR 30-59 ml/min (HCC)   . GERD (gastroesophageal reflux disease)   . Hyperlipidemia   . Kidney stones   . Lung mass    PET negative on 05/22/18  . Seizures (HCC)   . Stroke (HCC)   . Vitamin D deficiency     Surgical History: Past Surgical History:  Procedure Laterality Date  . HIP SURGERY Left   . KNEE SURGERY Left     Home Medications:  Allergies as of 04/04/2021      Reactions   Meperidine Nausea And Vomiting, Hives   Fainting and n/v (Demerol) Fainting and n/v (Demerol)   Other Hives      Medication List       Accurate as of Apr 04, 2021  2:59 PM. If  you have any questions, ask your nurse or doctor.        aspirin 81 MG EC tablet Take 1 tablet by mouth daily.   atorvastatin 20 MG tablet Commonly known as: LIPITOR Take 1 tablet (20 mg total) by mouth daily.   gabapentin 300 MG capsule Commonly known as: NEURONTIN TAKE 1 CAPSULE(300 MG) BY MOUTH DAILY   lamoTRIgine 200 MG tablet Commonly known as: LAMICTAL Take 1 tablet (200 mg total) by mouth daily.   nortriptyline 50 MG capsule Commonly known as: PAMELOR TAKE 2 CAPSULES NIGHTLY.   omeprazole 40 MG capsule Commonly known as: PRILOSEC Take 1 capsule (40 mg total) by mouth daily.   ondansetron 4 MG tablet Commonly known as: ZOFRAN Take 4 mg by mouth every 8 (eight) hours as needed for nausea or vomiting.       Allergies:  Allergies  Allergen Reactions  . Meperidine Nausea And Vomiting and Hives    Fainting and n/v (Demerol) Fainting and n/v (Demerol)   . Other Hives    Family History: Family History  Problem Relation Age of Onset  . Heart disease Mother   . Heart attack Mother   . Heart disease Father   .  Heart failure Father   . Heart disease Maternal Grandmother   . Heart disease Maternal Grandfather   . Diabetes Paternal Grandmother     Social History:  reports that she has been smoking cigarettes. She has been smoking about 1.00 pack per day. She has never used smokeless tobacco. She reports that she does not drink alcohol and does not use drugs.   Physical Exam: There were no vitals taken for this visit.  Constitutional:  Alert and oriented, No acute distress. HEENT: [place] AT, moist mucus membranes.  Trachea midline, no masses. Cardiovascular: No clubbing, cyanosis, or edema. Respiratory: Normal respiratory effort, no increased work of breathing. GI: Abdomen is soft, nontender, nondistended, no abdominal masses GU: No CVA tenderness Skin: No rashes, bruises or suspicious lesions. Neurologic: Grossly intact, no focal deficits, moving all 4  extremities. Psychiatric: Normal mood and affect.  Laboratory Data: Lab Results  Component Value Date   WBC 5.6 03/17/2021   HGB 11.5 03/17/2021   HCT 37.4 03/17/2021   MCV 80 03/17/2021   PLT 200 03/17/2021    Lab Results  Component Value Date   CREATININE 1.39 (H) 03/17/2021    No results found for: PSA  No results found for: TESTOSTERONE  No results found for: HGBA1C  Urinalysis    Component Value Date/Time   APPEARANCEUR Clear 08/10/2020 1025   GLUCOSEU Negative 08/10/2020 1025   BILIRUBINUR Negative 08/10/2020 1025   PROTEINUR Trace (A) 08/10/2020 1025   NITRITE Negative 08/10/2020 1025   LEUKOCYTESUR Trace (A) 08/10/2020 1025    Lab Results  Component Value Date   LABMICR See below: 08/10/2020   WBCUA None seen 08/10/2020   LABEPIT 0-10 08/10/2020   BACTERIA None seen 08/10/2020    Pertinent Imaging: CT images on [place] 11/21 and 01/22.     Assessment & Plan:    Renal and ureteral stones - she has a complex problem. The stent could be a serious issue if encrustated. Will check a KUB and discussed the nature r/b/a to cysto, left ureteral stent removal, left URS/HLL/stent exchange and possible right URS if stone obstructing and time allows vs f/u surveillance or ESWL of the right stone.  I discussed we would do our best to help her, but she must stay on top of things and follow-up.  We discussed stent encrustation and retention can risk loss of her entire kidney.  Urine sent for cx. Start a night NF to keep urine clear up to URS.  Again, she has significant transportation issues and would find it very difficult to get down to [facility] for surgery, discuss my colleague Dr. Ronne Binning would be doing the procedure.  No follow-ups on file.  Jerilee Field, MD

## 2021-04-04 NOTE — Patient Instructions (Signed)
Ureteral Stent Implantation, Care After This sheet gives you information about how to care for yourself after your procedure. Your health care provider may also give you more specific instructions. If you have problems or questions, contact your health care provider.  BE SURE TO F/U AS  PLANNED FOR STENT AND STONE REMOVAL   What can I expect after the procedure? After the procedure, it is common to have:  Nausea.  Mild pain when you urinate. You may feel this pain in your lower back or lower abdomen. The pain should stop within a few minutes after you urinate. This may last for up to 1 week.  A small amount of blood in your urine for several days. Follow these instructions at home: Medicines  Take over-the-counter and prescription medicines only as told by your health care provider.  If you were prescribed an antibiotic medicine, take it as told by your health care provider. Do not stop taking the antibiotic even if you start to feel better.  Do not drive for 24 hours if you were given a sedative during your procedure.  Ask your health care provider if the medicine prescribed to you requires you to avoid driving or using heavy machinery. Activity  Rest as told by your health care provider.  Avoid sitting for a long time without moving. Get up to take short walks every 1-2 hours. This is important to improve blood flow and breathing. Ask for help if you feel weak or unsteady.  Return to your normal activities as told by your health care provider. Ask your health care provider what activities are safe for you. General instructions  Watch for any blood in your urine. Call your health care provider if the amount of blood in your urine increases.  If you have a catheter: ? Follow instructions from your health care provider about taking care of your catheter and collection bag. ? Do not take baths, swim, or use a hot tub until your health care provider approves. Ask your health care  provider if you may take showers. You may only be allowed to take sponge baths.  Drink enough fluid to keep your urine pale yellow.  Do not use any products that contain nicotine or tobacco, such as cigarettes, e-cigarettes, and chewing tobacco. These can delay healing after surgery. If you need help quitting, ask your health care provider.  Keep all follow-up visits as told by your health care provider. This is important.   Contact a health care provider if:  You have pain that gets worse or does not get better with medicine, especially pain when you urinate.  You have difficulty urinating.  You feel nauseous or you vomit repeatedly during a period of more than 2 days after the procedure. Get help right away if:  Your urine is dark red or has blood clots in it.  You are leaking urine (have incontinence).  The end of the stent comes out of your urethra.  You cannot urinate.  You have sudden, sharp, or severe pain in your abdomen or lower back.  You have a fever.  You have swelling or pain in your legs.  You have difficulty breathing. Summary  After the procedure, it is common to have mild pain when you urinate that goes away within a few minutes after you urinate. This may last for up to 1 week.  Watch for any blood in your urine. Call your health care provider if the amount of blood in your urine increases.  Take over-the-counter and prescription medicines only as told by your health care provider.  Drink enough fluid to keep your urine pale yellow. This information is not intended to replace advice given to you by your health care provider. Make sure you discuss any questions you have with your health care provider. Document Revised: 08/13/2018 Document Reviewed: 08/14/2018 Elsevier Patient Education  2021 [employer].

## 2021-04-06 LAB — URINE CULTURE: Organism ID, Bacteria: NO GROWTH

## 2021-04-07 ENCOUNTER — Telehealth: Payer: Self-pay

## 2021-04-07 NOTE — Telephone Encounter (Signed)
-----   Message from Jerilee Field, MD sent at 04/07/2021  9:21 AM EDT ----- Let Toniann Fail know the stent looks OK and should be able to be removed but very important to keep procedure with Dr. Ronne Binning to get stent/stone removed. Stent has been in since November.    ----- Message ----- From: Ferdinand Lango, RN Sent: 04/06/2021  11:28 AM EDT To: Jerilee Field, MD  Please review

## 2021-04-07 NOTE — Telephone Encounter (Signed)
Patient called and notified. Surgery date requested for June 16th.

## 2021-05-02 NOTE — Patient Instructions (Signed)
Maria Campbell  05/02/2021     @PREFPERIOPPHARMACY @   Your procedure is scheduled on 05/05/2021.   Report to 05/07/2021 at  1115  A.M.  Call this number if you have problems the morning of surgery:  [phone]   Remember:  Do not eat or drink after midnight.      Take these medicines the morning of surgery with A SIP OF WATER   Gabapentin, lamictal, omeprazole, zofran (if needed).     Please brush your teeth.  Do not wear jewelry, make-up or nail polish.  Do not wear lotions, powders, or perfumes, or deodorant.  Do not shave 48 hours prior to surgery.  Men may shave face and neck.  Do not bring valuables to the hospital.  [facility] is not responsible for any belongings or valuables.  Contacts, dentures or bridgework may not be worn into surgery.  Leave your suitcase in the car.  After surgery it may be brought to your room.  For patients admitted to the hospital, discharge time will be determined by your treatment team.  Patients discharged the day of surgery will not be allowed to drive home and must have someone with them for 24 hours.    Special instructions:   DO NOT smoke tobacco or vape for 24 hours before your procedure.  Please read over the following fact sheets that you were given. Coughing and Deep Breathing, Surgical Site Infection Prevention, Anesthesia Post-op Instructions, and Care and Recovery After Surgery      Ureteral Stent Implantation, Care After This sheet gives you information about how to care for yourself after your procedure. Your health care provider may also give you more specific instructions. If you have problems or questions, contact your health careprovider. What can I expect after the procedure? After the procedure, it is common to have: Nausea. Mild pain when you urinate. You may feel this pain in your lower back or lower abdomen. The pain should stop within a few minutes after you urinate. This may last for up to 1  week. A small amount of blood in your urine for several days. Follow these instructions at home: Medicines Take over-the-counter and prescription medicines only as told by your health care provider. If you were prescribed an antibiotic medicine, take it as told by your health care provider. Do not stop taking the antibiotic even if you start to feel better. Do not drive for 24 hours if you were given a sedative during your procedure. Ask your health care provider if the medicine prescribed to you requires you to avoid driving or using heavy machinery. Activity Rest as told by your health care provider. Avoid sitting for a long time without moving. Get up to take short walks every 1-2 hours. This is important to improve blood flow and breathing. Ask for help if you feel weak or unsteady. Return to your normal activities as told by your health care provider. Ask your health care provider what activities are safe for you. General instructions  Watch for any blood in your urine. Call your health care provider if the amount of blood in your urine increases. If you have a catheter: Follow instructions from your health care provider about taking care of your catheter and collection bag. Do not take baths, swim, or use a hot tub until your health care provider approves. Ask your health care provider if you may take showers. You may only be allowed to  take sponge baths. Drink enough fluid to keep your urine pale yellow. Do not use any products that contain nicotine or tobacco, such as cigarettes, e-cigarettes, and chewing tobacco. These can delay healing after surgery. If you need help quitting, ask your health care provider. Keep all follow-up visits as told by your health care provider. This is important.  Contact a health care provider if: You have pain that gets worse or does not get better with medicine, especially pain when you urinate. You have difficulty urinating. You feel nauseous or you  vomit repeatedly during a period of more than 2 days after the procedure. Get help right away if: Your urine is dark red or has blood clots in it. You are leaking urine (have incontinence). The end of the stent comes out of your urethra. You cannot urinate. You have sudden, sharp, or severe pain in your abdomen or lower back. You have a fever. You have swelling or pain in your legs. You have difficulty breathing. Summary After the procedure, it is common to have mild pain when you urinate that goes away within a few minutes after you urinate. This may last for up to 1 week. Watch for any blood in your urine. Call your health care provider if the amount of blood in your urine increases. Take over-the-counter and prescription medicines only as told by your health care provider. Drink enough fluid to keep your urine pale yellow. This information is not intended to replace advice given to you by your health care provider. Make sure you discuss any questions you have with your healthcare provider. Document Revised: 08/13/2018 Document Reviewed: 08/14/2018 Elsevier Patient Education  2022 [employer] General Anesthesia, Adult, Care After This sheet gives you information about how to care for yourself after your procedure. Your health care provider may also give you more specific instructions. If you have problems or questions, contact your health careprovider. What can I expect after the procedure? After the procedure, the following side effects are common: Pain or discomfort at the IV site. Nausea. Vomiting. Sore throat. Trouble concentrating. Feeling cold or chills. Feeling weak or tired. Sleepiness and fatigue. Soreness and body aches. These side effects can affect parts of the body that were not involved in surgery. Follow these instructions at home: For the time period you were told by your health care provider:  Rest. Do not participate in activities where you could fall or  become injured. Do not drive or use machinery. Do not drink alcohol. Do not take sleeping pills or medicines that cause drowsiness. Do not make important decisions or sign legal documents. Do not take care of children on your own.  Eating and drinking Follow any instructions from your health care provider about eating or drinking restrictions. When you feel hungry, start by eating small amounts of foods that are soft and easy to digest (bland), such as toast. Gradually return to your regular diet. Drink enough fluid to keep your urine pale yellow. If you vomit, rehydrate by drinking water, juice, or clear broth. General instructions If you have sleep apnea, surgery and certain medicines can increase your risk for breathing problems. Follow instructions from your health care provider about wearing your sleep device: Anytime you are sleeping, including during daytime naps. While taking prescription pain medicines, sleeping medicines, or medicines that make you drowsy. Have a responsible adult stay with you for the time you are told. It is important to have someone help care for you until you are awake and  alert. Return to your normal activities as told by your health care provider. Ask your health care provider what activities are safe for you. Take over-the-counter and prescription medicines only as told by your health care provider. If you smoke, do not smoke without supervision. Keep all follow-up visits as told by your health care provider. This is important. Contact a health care provider if: You have nausea or vomiting that does not get better with medicine. You cannot eat or drink without vomiting. You have pain that does not get better with medicine. You are unable to pass urine. You develop a skin rash. You have a fever. You have redness around your IV site that gets worse. Get help right away if: You have difficulty breathing. You have chest pain. You have blood in your urine  or stool, or you vomit blood. Summary After the procedure, it is common to have a sore throat or nausea. It is also common to feel tired. Have a responsible adult stay with you for the time you are told. It is important to have someone help care for you until you are awake and alert. When you feel hungry, start by eating small amounts of foods that are soft and easy to digest (bland), such as toast. Gradually return to your regular diet. Drink enough fluid to keep your urine pale yellow. Return to your normal activities as told by your health care provider. Ask your health care provider what activities are safe for you. This information is not intended to replace advice given to you by your health care provider. Make sure you discuss any questions you have with your healthcare provider. Document Revised: 07/22/2020 Document Reviewed: 02/19/2020 Elsevier Patient Education  2022 [employer] How to Use Chlorhexidine for Bathing Chlorhexidine gluconate (CHG) is a germ-killing (antiseptic) solution that is used to clean the skin. It can get rid of the bacteria that normally live on the skin and can keep them away for about 24 hours. To clean your skin with CHG, you may be given: A CHG solution to use in the shower or as part of a sponge bath. A prepackaged cloth that contains CHG. Cleaning your skin with CHG may help lower the risk for infection: While you are staying in the intensive care unit of the hospital. If you have a vascular access, such as a central line, to provide short-term or long-term access to your veins. If you have a catheter to drain urine from your bladder. If you are on a ventilator. A ventilator is a machine that helps you breathe by moving air in and out of your lungs. After surgery. What are the risks? Risks of using CHG include: A skin reaction. Hearing loss, if CHG gets in your ears. Eye injury, if CHG gets in your eyes and is not rinsed out. The CHG product catching  fire. Make sure that you avoid smoking and flames after applying CHG to your skin. Do not use CHG: If you have a chlorhexidine allergy or have previously reacted to chlorhexidine. On babies younger than 242 months of age. How to use CHG solution Use CHG only as told by your health care provider, and follow the instructions on the label. Use the full amount of CHG as directed. Usually, this is one bottle. During a shower Follow these steps when using CHG solution during a shower (unless your health care provider gives you different instructions): Start the shower. Use your normal soap and shampoo to wash your face and hair.  Turn off the shower or move out of the shower stream. Pour the CHG onto a clean washcloth. Do not use any type of brush or rough-edged sponge. Starting at your neck, lather your body down to your toes. Make sure you follow these instructions: If you will be having surgery, pay special attention to the part of your body where you will be having surgery. Scrub this area for at least 1 minute. Do not use CHG on your head or face. If the solution gets into your ears or eyes, rinse them well with water. Avoid your genital area. Avoid any areas of skin that have broken skin, cuts, or scrapes. Scrub your back and under your arms. Make sure to wash skin folds. Let the lather sit on your skin for 1-2 minutes or as long as told by your health care provider. Thoroughly rinse your entire body in the shower. Make sure that all body creases and crevices are rinsed well. Dry off with a clean towel. Do not put any substances on your body afterward--such as powder, lotion, or perfume--unless you are told to do so by your health care provider. Only use lotions that are recommended by the manufacturer. Put on clean clothes or pajamas. If it is the night before your surgery, sleep in clean sheets.  During a sponge bath Follow these steps when using CHG solution during a sponge bath (unless  your health care provider gives you different instructions): Use your normal soap and shampoo to wash your face and hair. Pour the CHG onto a clean washcloth. Starting at your neck, lather your body down to your toes. Make sure you follow these instructions: If you will be having surgery, pay special attention to the part of your body where you will be having surgery. Scrub this area for at least 1 minute. Do not use CHG on your head or face. If the solution gets into your ears or eyes, rinse them well with water. Avoid your genital area. Avoid any areas of skin that have broken skin, cuts, or scrapes. Scrub your back and under your arms. Make sure to wash skin folds. Let the lather sit on your skin for 1-2 minutes or as long as told by your health care provider. Using a different clean, wet washcloth, thoroughly rinse your entire body. Make sure that all body creases and crevices are rinsed well. Dry off with a clean towel. Do not put any substances on your body afterward--such as powder, lotion, or perfume--unless you are told to do so by your health care provider. Only use lotions that are recommended by the manufacturer. Put on clean clothes or pajamas. If it is the night before your surgery, sleep in clean sheets. How to use CHG prepackaged cloths Only use CHG cloths as told by your health care provider, and follow the instructions on the label. Use the CHG cloth on clean, dry skin. Do not use the CHG cloth on your head or face unless your health care provider tells you to. When washing with the CHG cloth: Avoid your genital area. Avoid any areas of skin that have broken skin, cuts, or scrapes. Before surgery Follow these steps when using a CHG cloth to clean before surgery (unless your health care provider gives you different instructions): Using the CHG cloth, vigorously scrub the part of your body where you will be having surgery. Scrub using a back-and-forth motion for 3 minutes. The  area on your body should be completely wet with CHG when  you are done scrubbing. Do not rinse. Discard the cloth and let the area air-dry. Do not put any substances on the area afterward, such as powder, lotion, or perfume. Put on clean clothes or pajamas. If it is the night before your surgery, sleep in clean sheets.  For general bathing Follow these steps when using CHG cloths for general bathing (unless your health care provider gives you different instructions). Use a separate CHG cloth for each area of your body. Make sure you wash between any folds of skin and between your fingers and toes. Wash your body in the following order, switching to a new cloth after each step: The front of your neck, shoulders, and chest. Both of your arms, under your arms, and your hands. Your stomach and groin area, avoiding the genitals. Your right leg and foot. Your left leg and foot. The back of your neck, your back, and your buttocks. Do not rinse. Discard the cloth and let the area air-dry. Do not put any substances on your body afterward--such as powder, lotion, or perfume--unless you are told to do so by your health care provider. Only use lotions that are recommended by the manufacturer. Put on clean clothes or pajamas. Contact a health care provider if: Your skin gets irritated after scrubbing. You have questions about using your solution or cloth. Get help right away if: Your eyes become very red or swollen. Your eyes itch badly. Your skin itches badly and is red or swollen. Your hearing changes. You have trouble seeing. You have swelling or tingling in your mouth or throat. You have trouble breathing. You swallow any chlorhexidine. Summary Chlorhexidine gluconate (CHG) is a germ-killing (antiseptic) solution that is used to clean the skin. Cleaning your skin with CHG may help to lower your risk for infection. You may be given CHG to use for bathing. It may be in a bottle or in a prepackaged  cloth to use on your skin. Carefully follow your health care provider's instructions and the instructions on the product label. Do not use CHG if you have a chlorhexidine allergy. Contact your health care provider if your skin gets irritated after scrubbing. This information is not intended to replace advice given to you by your health care provider. Make sure you discuss any questions you have with your healthcare provider. Document Revised: 03/19/2020 Document Reviewed: 04/23/2020 Elsevier Patient Education  2022 [employer].

## 2021-05-04 ENCOUNTER — Encounter (HOSPITAL_COMMUNITY)
Admission: RE | Admit: 2021-05-04 | Discharge: 2021-05-04 | Disposition: A | Discharge status: Home / Self Care | Payer: Medicare HMO | Source: Ambulatory Visit | Attending: Urology | Admitting: Urology

## 2021-05-04 ENCOUNTER — Other Ambulatory Visit (HOSPITAL_COMMUNITY)
Admission: RE | Admit: 2021-05-04 | Discharge: 2021-05-04 | Disposition: A | Discharge status: Home / Self Care | Payer: Medicare HMO | Source: Ambulatory Visit | Attending: Urology | Admitting: Urology

## 2021-05-04 ENCOUNTER — Other Ambulatory Visit (HOSPITAL_COMMUNITY): Payer: Medicare HMO | Attending: Urology

## 2021-05-04 ENCOUNTER — Other Ambulatory Visit: Payer: Self-pay

## 2021-05-04 DIAGNOSIS — Z01812 Encounter for preprocedural laboratory examination: Secondary | ICD-10-CM | POA: Insufficient documentation

## 2021-05-04 DIAGNOSIS — N200 Calculus of kidney: Secondary | ICD-10-CM | POA: Insufficient documentation

## 2021-05-04 LAB — BASIC METABOLIC PANEL
Anion gap: 7 (ref 5–15)
BUN: 11 mg/dL (ref 6–20)
CO2: 25 mmol/L (ref 22–32)
Calcium: 9.3 mg/dL (ref 8.9–10.3)
Chloride: 104 mmol/L (ref 98–111)
Creatinine, Ser: 1.5 mg/dL — ABNORMAL HIGH (ref 0.44–1.00)
GFR, Estimated: 41 mL/min — ABNORMAL LOW (ref >60)
Glucose, Bld: 108 mg/dL — ABNORMAL HIGH (ref 70–99)
Potassium: 3.7 mmol/L (ref 3.5–5.1)
Sodium: 136 mmol/L (ref 135–145)

## 2021-05-04 LAB — PREGNANCY, URINE: Preg Test, Ur: NEGATIVE

## 2021-05-05 ENCOUNTER — Ambulatory Visit (HOSPITAL_COMMUNITY): Payer: Medicare HMO | Admitting: Anesthesiology

## 2021-05-05 ENCOUNTER — Encounter (HOSPITAL_COMMUNITY)
Admission: RE | Disposition: A | Discharge status: Home / Self Care | Payer: Self-pay | Source: Home / Self Care | Attending: Urology

## 2021-05-05 ENCOUNTER — Other Ambulatory Visit: Payer: Self-pay

## 2021-05-05 ENCOUNTER — Ambulatory Visit (HOSPITAL_COMMUNITY): Payer: Medicare HMO

## 2021-05-05 ENCOUNTER — Ambulatory Visit (HOSPITAL_COMMUNITY)
Admission: RE | Admit: 2021-05-05 | Discharge: 2021-05-05 | Disposition: A | Discharge status: Home / Self Care | Payer: Medicare HMO | Attending: Urology | Admitting: Urology

## 2021-05-05 ENCOUNTER — Encounter (HOSPITAL_COMMUNITY): Payer: Self-pay | Admitting: Urology

## 2021-05-05 DIAGNOSIS — N201 Calculus of ureter: Secondary | ICD-10-CM

## 2021-05-05 DIAGNOSIS — N183 Chronic kidney disease, stage 3 unspecified: Secondary | ICD-10-CM | POA: Diagnosis not present

## 2021-05-05 DIAGNOSIS — Z87442 Personal history of urinary calculi: Secondary | ICD-10-CM | POA: Diagnosis not present

## 2021-05-05 DIAGNOSIS — Z91048 Other nonmedicinal substance allergy status: Secondary | ICD-10-CM | POA: Insufficient documentation

## 2021-05-05 DIAGNOSIS — F17210 Nicotine dependence, cigarettes, uncomplicated: Secondary | ICD-10-CM | POA: Diagnosis not present

## 2021-05-05 DIAGNOSIS — Z833 Family history of diabetes mellitus: Secondary | ICD-10-CM | POA: Insufficient documentation

## 2021-05-05 DIAGNOSIS — Z8249 Family history of ischemic heart disease and other diseases of the circulatory system: Secondary | ICD-10-CM | POA: Diagnosis not present

## 2021-05-05 DIAGNOSIS — Z885 Allergy status to narcotic agent status: Secondary | ICD-10-CM | POA: Diagnosis not present

## 2021-05-05 DIAGNOSIS — N202 Calculus of kidney with calculus of ureter: Secondary | ICD-10-CM | POA: Diagnosis present

## 2021-05-05 DIAGNOSIS — Z79899 Other long term (current) drug therapy: Secondary | ICD-10-CM | POA: Diagnosis not present

## 2021-05-05 DIAGNOSIS — R69 Illness, unspecified: Secondary | ICD-10-CM | POA: Diagnosis not present

## 2021-05-05 DIAGNOSIS — N132 Hydronephrosis with renal and ureteral calculous obstruction: Secondary | ICD-10-CM | POA: Diagnosis not present

## 2021-05-05 DIAGNOSIS — N1330 Unspecified hydronephrosis: Secondary | ICD-10-CM | POA: Diagnosis not present

## 2021-05-05 HISTORY — PX: CYSTOSCOPY WITH RETROGRADE PYELOGRAM, URETEROSCOPY AND STENT PLACEMENT: SHX5789

## 2021-05-05 HISTORY — PX: STONE EXTRACTION WITH BASKET: SHX5318

## 2021-05-05 HISTORY — PX: CYSTOSCOPY W/ URETERAL STENT PLACEMENT: SHX1429

## 2021-05-05 HISTORY — PX: HOLMIUM LASER APPLICATION: SHX5852

## 2021-05-05 SURGERY — CYSTOURETEROSCOPY, WITH RETROGRADE PYELOGRAM AND STENT INSERTION
Anesthesia: General | Laterality: Left

## 2021-05-05 MED ORDER — ONDANSETRON HCL 4 MG/2ML IJ SOLN
INTRAMUSCULAR | Status: DC | PRN
  Administered 2021-05-05: 4 mg via INTRAVENOUS

## 2021-05-05 MED ORDER — CHLORHEXIDINE GLUCONATE 0.12 % MT SOLN
15.0000 mL | Freq: Once | OROMUCOSAL | Status: AC
  Administered 2021-05-05: 15 mL via OROMUCOSAL

## 2021-05-05 MED ORDER — SEVOFLURANE IN SOLN
RESPIRATORY_TRACT | Status: AC
  Filled 2021-05-05: qty 250

## 2021-05-05 MED ORDER — SUCCINYLCHOLINE CHLORIDE 200 MG/10ML IV SOSY
PREFILLED_SYRINGE | INTRAVENOUS | Status: AC
  Filled 2021-05-05: qty 10

## 2021-05-05 MED ORDER — EPHEDRINE 5 MG/ML INJ
INTRAVENOUS | Status: AC
  Filled 2021-05-05: qty 10

## 2021-05-05 MED ORDER — KETOROLAC TROMETHAMINE 30 MG/ML IJ SOLN
INTRAMUSCULAR | Status: AC
  Filled 2021-05-05: qty 1

## 2021-05-05 MED ORDER — LIDOCAINE 2% (20 MG/ML) 5 ML SYRINGE
INTRAMUSCULAR | Status: DC | PRN
  Administered 2021-05-05: 100 mg via INTRAVENOUS

## 2021-05-05 MED ORDER — KETOROLAC TROMETHAMINE 30 MG/ML IJ SOLN
INTRAMUSCULAR | Status: DC | PRN
  Administered 2021-05-05: 30 mg via INTRAVENOUS

## 2021-05-05 MED ORDER — DEXAMETHASONE SODIUM PHOSPHATE 10 MG/ML IJ SOLN
INTRAMUSCULAR | Status: AC
  Filled 2021-05-05: qty 1

## 2021-05-05 MED ORDER — MIDAZOLAM HCL 5 MG/5ML IJ SOLN
INTRAMUSCULAR | Status: DC | PRN
  Administered 2021-05-05: 2 mg via INTRAVENOUS

## 2021-05-05 MED ORDER — ROCURONIUM BROMIDE 10 MG/ML (PF) SYRINGE
PREFILLED_SYRINGE | INTRAVENOUS | Status: AC
  Filled 2021-05-05: qty 10

## 2021-05-05 MED ORDER — DIATRIZOATE MEGLUMINE 30 % UR SOLN
URETHRAL | Status: DC | PRN
  Administered 2021-05-05: 26 mL

## 2021-05-05 MED ORDER — ROCURONIUM BROMIDE 10 MG/ML (PF) SYRINGE
PREFILLED_SYRINGE | INTRAVENOUS | Status: DC | PRN
  Administered 2021-05-05: 30 mg via INTRAVENOUS

## 2021-05-05 MED ORDER — ONDANSETRON HCL 4 MG/2ML IJ SOLN
4.0000 mg | Freq: Once | INTRAMUSCULAR | Status: AC | PRN
  Administered 2021-05-05: 4 mg via INTRAVENOUS
  Filled 2021-05-05: qty 2

## 2021-05-05 MED ORDER — WATER FOR IRRIGATION, STERILE IR SOLN
Status: DC | PRN
  Administered 2021-05-05: 1000 mL

## 2021-05-05 MED ORDER — DIATRIZOATE MEGLUMINE 30 % UR SOLN
URETHRAL | Status: AC
  Filled 2021-05-05: qty 100

## 2021-05-05 MED ORDER — SUGAMMADEX SODIUM 200 MG/2ML IV SOLN
INTRAVENOUS | Status: DC | PRN
  Administered 2021-05-05: 200 mg via INTRAVENOUS

## 2021-05-05 MED ORDER — PROPOFOL 10 MG/ML IV BOLUS
INTRAVENOUS | Status: AC
  Filled 2021-05-05: qty 20

## 2021-05-05 MED ORDER — FENTANYL CITRATE (PF) 100 MCG/2ML IJ SOLN
INTRAMUSCULAR | Status: AC
  Filled 2021-05-05: qty 2

## 2021-05-05 MED ORDER — SODIUM CHLORIDE 0.9 % IR SOLN
Status: DC | PRN
  Administered 2021-05-05: 3000 mL via INTRAVESICAL
  Administered 2021-05-05: 3000 mL

## 2021-05-05 MED ORDER — LACTATED RINGERS IV SOLN: INTRAVENOUS | Status: DC

## 2021-05-05 MED ORDER — PHENYLEPHRINE 40 MCG/ML (10ML) SYRINGE FOR IV PUSH (FOR BLOOD PRESSURE SUPPORT)
PREFILLED_SYRINGE | INTRAVENOUS | Status: DC | PRN
  Administered 2021-05-05 (×3): 80 ug via INTRAVENOUS

## 2021-05-05 MED ORDER — CHLORHEXIDINE GLUCONATE 0.12 % MT SOLN
OROMUCOSAL | Status: AC
  Filled 2021-05-05: qty 15

## 2021-05-05 MED ORDER — MIDAZOLAM HCL 2 MG/2ML IJ SOLN
INTRAMUSCULAR | Status: AC
  Filled 2021-05-05: qty 2

## 2021-05-05 MED ORDER — ORAL CARE MOUTH RINSE: 15.0000 mL | Freq: Once | OROMUCOSAL | Status: AC

## 2021-05-05 MED ORDER — LIDOCAINE HCL (PF) 2 % IJ SOLN
INTRAMUSCULAR | Status: AC
  Filled 2021-05-05: qty 5

## 2021-05-05 MED ORDER — PROPOFOL 10 MG/ML IV BOLUS
INTRAVENOUS | Status: DC | PRN
  Administered 2021-05-05: 50 mg via INTRAVENOUS
  Administered 2021-05-05: 150 mg via INTRAVENOUS

## 2021-05-05 MED ORDER — PROMETHAZINE HCL 25 MG/ML IJ SOLN
12.5000 mg | Freq: Once | INTRAMUSCULAR | Status: AC
  Administered 2021-05-05: 12.5 mg via INTRAVENOUS
  Filled 2021-05-05: qty 1

## 2021-05-05 MED ORDER — FENTANYL CITRATE (PF) 100 MCG/2ML IJ SOLN
25.0000 ug | INTRAMUSCULAR | Status: DC | PRN
  Administered 2021-05-05 (×2): 50 ug via INTRAVENOUS
  Administered 2021-05-05: 25 ug via INTRAVENOUS
  Filled 2021-05-05 (×2): qty 2

## 2021-05-05 MED ORDER — ONDANSETRON HCL 4 MG/2ML IJ SOLN
INTRAMUSCULAR | Status: AC
  Filled 2021-05-05: qty 2

## 2021-05-05 MED ORDER — OXYCODONE-ACETAMINOPHEN 5-325 MG PO TABS: 1.0000 | ORAL_TABLET | ORAL | 0 refills | Status: DC | PRN

## 2021-05-05 MED ORDER — SUCCINYLCHOLINE CHLORIDE 200 MG/10ML IV SOSY
PREFILLED_SYRINGE | INTRAVENOUS | Status: AC
  Filled 2021-05-05: qty 20

## 2021-05-05 MED ORDER — DEXAMETHASONE SODIUM PHOSPHATE 10 MG/ML IJ SOLN
INTRAMUSCULAR | Status: DC | PRN
  Administered 2021-05-05: 4 mg via INTRAVENOUS

## 2021-05-05 MED ORDER — CEFAZOLIN SODIUM-DEXTROSE 2-4 GM/100ML-% IV SOLN
2.0000 g | INTRAVENOUS | Status: AC
  Administered 2021-05-05: 2 g via INTRAVENOUS
  Filled 2021-05-05: qty 100

## 2021-05-05 MED ORDER — EPHEDRINE SULFATE-NACL 50-0.9 MG/10ML-% IV SOSY
PREFILLED_SYRINGE | INTRAVENOUS | Status: DC | PRN
  Administered 2021-05-05 (×2): 5 mg via INTRAVENOUS

## 2021-05-05 MED ORDER — FENTANYL CITRATE (PF) 100 MCG/2ML IJ SOLN
INTRAMUSCULAR | Status: DC | PRN
  Administered 2021-05-05 (×2): 50 ug via INTRAVENOUS

## 2021-05-05 MED ORDER — SUCCINYLCHOLINE CHLORIDE 200 MG/10ML IV SOSY
PREFILLED_SYRINGE | INTRAVENOUS | Status: DC | PRN
  Administered 2021-05-05: 120 mg via INTRAVENOUS

## 2021-05-05 SURGICAL SUPPLY — 26 items
BAG DRAIN URO TABLE W/ADPT NS (BAG) ×3 IMPLANT
BAG DRN 8 ADPR NS SKTRN CSTL (BAG) ×2
BAG HAMPER (MISCELLANEOUS) ×3 IMPLANT
CATH INTERMIT  6FR 70CM (CATHETERS) ×3 IMPLANT
CLOTH BEACON ORANGE TIMEOUT ST (SAFETY) ×3 IMPLANT
DECANTER SPIKE VIAL GLASS SM (MISCELLANEOUS) ×3 IMPLANT
EXTRACTOR STONE NITINOL NGAGE (UROLOGICAL SUPPLIES) ×3 IMPLANT
GLOVE BIO SURGEON STRL SZ8 (GLOVE) ×3 IMPLANT
GLOVE SURG UNDER POLY LF SZ7 (GLOVE) ×6 IMPLANT
GOWN STRL REUS W/TWL LRG LVL3 (GOWN DISPOSABLE) ×3 IMPLANT
GOWN STRL REUS W/TWL XL LVL3 (GOWN DISPOSABLE) ×3 IMPLANT
GUIDEWIRE STR DUAL SENSOR (WIRE) ×3 IMPLANT
GUIDEWIRE STR ZIPWIRE 035X150 (MISCELLANEOUS) ×3 IMPLANT
IV NS IRRIG 3000ML ARTHROMATIC (IV SOLUTION) ×6 IMPLANT
KIT TURNOVER CYSTO (KITS) ×3 IMPLANT
MANIFOLD NEPTUNE II (INSTRUMENTS) ×3 IMPLANT
PACK CYSTO (CUSTOM PROCEDURE TRAY) ×3 IMPLANT
PAD ARMBOARD 7.5X6 YLW CONV (MISCELLANEOUS) ×3 IMPLANT
SHEATH URETERAL 12FRX35CM (MISCELLANEOUS) ×3 IMPLANT
STENT URET 6FRX24 CONTOUR (STENTS) ×6 IMPLANT
SYR 10ML LL (SYRINGE) ×3 IMPLANT
SYR CONTROL 10ML LL (SYRINGE) ×3 IMPLANT
TOWEL OR 17X26 4PK STRL BLUE (TOWEL DISPOSABLE) ×3 IMPLANT
TRACTIP FLEXIVA PULS ID 200XHI (Laser) ×2 IMPLANT
TRACTIP FLEXIVA PULSE ID 200 (Laser) ×3
WATER STERILE IRR 500ML POUR (IV SOLUTION) ×3 IMPLANT

## 2021-05-05 NOTE — Anesthesia Procedure Notes (Signed)
Procedure Name: Intubation Date/Time: 05/05/2021 12:45 PM Performed by: Julian Reil, CRNA Pre-anesthesia Checklist: Patient identified, Emergency Drugs available, Suction available and Patient being monitored Patient Re-evaluated:Patient Re-evaluated prior to induction Oxygen Delivery Method: Circle system utilized Preoxygenation: Pre-oxygenation with 100% oxygen Induction Type: IV induction Laryngoscope Size: Miller and 3 Grade View: Grade I Tube type: Oral Tube size: 7.5 mm Number of attempts: 1 Airway Equipment and Method: Stylet Placement Confirmation: ETT inserted through vocal cords under direct vision, positive ETCO2 and breath sounds checked- equal and bilateral Secured at: 23 cm Tube secured with: Tape Dental Injury: Teeth and Oropharynx as per pre-operative assessment

## 2021-05-05 NOTE — Anesthesia Preprocedure Evaluation (Signed)
Anesthesia Evaluation  Patient identified by MRN, date of birth, ID band Patient awake    Reviewed: Allergy & Precautions, H&P , NPO status , Patient's Chart, lab work & pertinent test results, reviewed documented beta blocker date and time   Airway Mallampati: II  TM Distance: >3 FB Neck ROM: full    Dental no notable dental hx.    Pulmonary neg pulmonary ROS, Current Smoker and Patient abstained from smoking.,    Pulmonary exam normal breath sounds clear to auscultation       Cardiovascular Exercise Tolerance: Good negative cardio ROS   Rhythm:regular Rate:Normal     Neuro/Psych  Headaches, Seizures -,  PSYCHIATRIC DISORDERS Anxiety Depression CVA    GI/Hepatic Neg liver ROS, GERD  Medicated,  Endo/Other  negative endocrine ROS  Renal/GU CRFRenal disease  negative genitourinary   Musculoskeletal   Abdominal   Peds  Hematology negative hematology ROS (+)   Anesthesia Other Findings   Reproductive/Obstetrics negative OB ROS                             Anesthesia Physical Anesthesia Plan  ASA: 3  Anesthesia Plan: General   Post-op Pain Management:    Induction:   PONV Risk Score and Plan: Ondansetron  Airway Management Planned:   Additional Equipment:   Intra-op Plan:   Post-operative Plan:   Informed Consent: I have reviewed the patients History and Physical, chart, labs and discussed the procedure including the risks, benefits and alternatives for the proposed anesthesia with the patient or authorized representative who has indicated his/her understanding and acceptance.     Dental Advisory Given  Plan Discussed with: CRNA  Anesthesia Plan Comments:         Anesthesia Quick Evaluation

## 2021-05-05 NOTE — Progress Notes (Signed)
After belching, coughing up phlegm and sm amt vomiting pt feels much better and wants to go home.

## 2021-05-05 NOTE — H&P (Signed)
Urology Admission H&P  Chief Complaint: bilateral flank pain  History of Present Illness: Maria Campbell is a 55yo here for bilateral ureteroscopic stone extraction. She has an indwelling ureteral stent since 09/2020. She has a 20mm right renal calculus and a 39mm left renal calculus.  Past Medical History:  Diagnosis Date   Barrett's esophagus    CKD (chronic kidney disease) stage 3, GFR 30-59 ml/min (HCC)    GERD (gastroesophageal reflux disease)    Hyperlipidemia    Kidney stones    Lung mass    PET negative on 05/22/18   Seizures (HCC)    Stroke (HCC)    Vitamin D deficiency    Past Surgical History:  Procedure Laterality Date   HIP SURGERY Left    KNEE SURGERY Left     Home Medications:  Current Facility-Administered Medications  Medication Dose Route Frequency Provider Last Rate Last Admin   ceFAZolin (ANCEF) IVPB 2g/100 mL premix  2 g Intravenous 30 min Pre-Op Jerilee Field, MD       chlorhexidine (PERIDEX) 0.12 % solution            lactated ringers infusion   Intravenous Continuous Windell Norfolk, MD 10 mL/hr at 05/05/21 1204 New Bag at 05/05/21 1204   Allergies:  Allergies  Allergen Reactions   Meperidine Hives and Nausea And Vomiting    Fainting and n/v (Demerol)    Other Hives   Dakin's [Sodium Hypochlorite] Nausea And Vomiting    Bleach    Family History  Problem Relation Age of Onset   Heart disease Mother    Heart attack Mother    Heart disease Father    Heart failure Father    Heart disease Maternal Grandmother    Heart disease Maternal Grandfather    Diabetes Paternal Grandmother    Social History:  reports that she has been smoking cigarettes. She has been smoking an average of 1.00 packs per day. She has never used smokeless tobacco. She reports that she does not drink alcohol and does not use drugs.  Review of Systems  Genitourinary:  Positive for flank pain.  All other systems reviewed and are negative.  Physical Exam:  Vital signs in last  24 hours: Temp:  [97.9 F (36.6 C)] 97.9 F (36.6 C) (06/16 1122) Resp:  [18] 18 (06/16 1122) BP: (125)/(82) 125/82 (06/16 1122) SpO2:  [97 %] 97 % (06/16 1122) Weight:  [104.3 kg] 104.3 kg (06/16 1122) Physical Exam Constitutional:      Appearance: Normal appearance.  HENT:     Head: Normocephalic and atraumatic.     Nose: Nose normal.     Mouth/Throat:     Mouth: Mucous membranes are dry.  Eyes:     Extraocular Movements: Extraocular movements intact.     Conjunctiva/sclera: Conjunctivae normal.     Pupils: Pupils are equal, round, and reactive to light.  Cardiovascular:     Rate and Rhythm: Normal rate and regular rhythm.  Pulmonary:     Effort: Pulmonary effort is normal. No respiratory distress.  Abdominal:     General: Abdomen is flat. There is no distension.  Musculoskeletal:        General: No swelling. Normal range of motion.     Cervical back: Normal range of motion and neck supple.  Skin:    General: Skin is warm and dry.  Neurological:     General: No focal deficit present.     Mental Status: She is alert and oriented to person,  place, and time.  Psychiatric:        Mood and Affect: Mood normal.        Behavior: Behavior normal.        Thought Content: Thought content normal.        Judgment: Judgment normal.    Laboratory Data:  No results found for this or any previous visit (from the past 24 hour(s)). No results found for this or any previous visit (from the past 240 hour(s)). Creatinine: Recent Labs    05/04/21 1020  CREATININE 1.50*   Baseline Creatinine: 1.5  Impression/Assessment:  55yo with bilateral renal calculi  Plan:  -We discussed the management of kidney stones. These options include observation, ureteroscopy, shockwave lithotripsy (ESWL) and percutaneous nephrolithotomy (PCNL). We discussed which options are relevant to the patient's stone(s). We discussed the natural history of kidney stones as well as the complications of untreated  stones and the impact on quality of life without treatment as well as with each of the above listed treatments. We also discussed the efficacy of each treatment in its ability to clear the stone burden. With any of these management options I discussed the signs and symptoms of infection and the need for emergent treatment should these be experienced. For each option we discussed the ability of each procedure to clear the patient of their stone burden.   For observation I described the risks which include but are not limited to silent renal damage, life-threatening infection, need for emergent surgery, failure to pass stone and pain.   For ureteroscopy I described the risks which include bleeding, infection, damage to contiguous structures, positioning injury, ureteral stricture, ureteral avulsion, ureteral injury, need for prolonged ureteral stent, inability to perform ureteroscopy, need for an interval procedure, inability to clear stone burden, stent discomfort/pain, heart attack, stroke, pulmonary embolus and the inherent risks with general anesthesia.   For shockwave lithotripsy I described the risks which include arrhythmia, kidney contusion, kidney hemorrhage, need for transfusion, pain, inability to adequately break up stone, inability to pass stone fragments, Steinstrasse, infection associated with obstructing stones, need for alternate surgical procedure, need for repeat shockwave lithotripsy, MI, CVA, PE and the inherent risks with anesthesia/conscious sedation.   For PCNL I described the risks including positioning injury, pneumothorax, hydrothorax, need for chest tube, inability to clear stone burden, renal laceration, arterial venous fistula or malformation, need for embolization of kidney, loss of kidney or renal function, need for repeat procedure, need for prolonged nephrostomy tube, ureteral avulsion, MI, CVA, PE and the inherent risks of general anesthesia.   - The patient would like to  proceed with  Bilateral ureteroscopic stone extraction  Wilkie Aye 05/05/2021, 12:07 PM

## 2021-05-05 NOTE — Progress Notes (Signed)
Dr Johnnette Litter called for nausea med by N. Marice Potter, RN- phenergan ordered- see orders.

## 2021-05-05 NOTE — Transfer of Care (Signed)
Immediate Anesthesia Transfer of Care Note  Patient: [name]  Procedure(s) Performed: CYSTOSCOPY WITH BILATERAL RETROGRADE PYELOGRAM, BILATERAL URETEROSCOPY,  RIGHT URETERAL STENT PLACEMENT (Left) HOLMIUM LASER LITHOTRIPSY RIGHT URETERAL CALCULUS (Left) LEFT URETERAL STONE EXTRACTION WITH BASKET CYSTOSCOPY WITH LEFT URETERAL STENT REPLACEMENT  Patient Location: PACU  Anesthesia Type:General  Level of Consciousness: awake and oriented  Airway & Oxygen Therapy: Patient Spontanous Breathing and Patient connected to face mask oxygen  Post-op Assessment: Report given to RN and Post -op Vital signs reviewed and stable  Post vital signs: Reviewed and stable  Last Vitals:  Vitals Value Taken Time  BP    Temp    Pulse 82 05/05/21 1405  Resp 17 05/05/21 1405  SpO2 100 % 05/05/21 1405  Vitals shown include unvalidated device data.  Last Pain:  Vitals:   05/05/21 1122  TempSrc: Oral  PainSc: 0-No pain      Patients Stated Pain Goal: 6 (05/05/21 1122)  Complications: No notable events documented.

## 2021-05-05 NOTE — Op Note (Signed)
Marland KitchenPreoperative diagnosis: bilateral renal calculi, left ureteral calculus  Postoperative diagnosis: Same  Procedure: 1 cystoscopy 2.  Bilateral retrograde pyelography 3.  Intraoperative fluoroscopy, under one hour, with interpretation 4.  Bilateral ureteroscopic stone manipulation with basket extraction and laser lithotripsy 5.  bilateral 6 x 26 JJ stent placement  Attending: Cleda Mccreedy  Anesthesia: General  Estimated blood loss: None  Drains: bilateral 6 x 26 JJ ureteral stent with tether  Specimens: stone for analysis  Antibiotics: Ancef  Findings: left ureteral calculus, moderate left hydronephrosis. Mild encrustation of the left ureteral stent. No left renal calculi identified. Right 71mm UPJ calculus. No right hydronephrosis. No masses/lesions in the bladder. Ureteral orifices in normal anatomic location.  Indications: Patient is a 55 year old female with a history of bilateral renal calculi and left ureteral calculus who underwent stent placement in November 2021. After discussing treatment options, they decided proceed with bilateral ureteroscopic stone manipulation.  Procedure her in detail: The patient was brought to the operating room and a brief timeout was done to ensure correct patient, correct procedure, correct site.  General anesthesia was administered patient was placed in dorsal lithotomy position.  Her genitalia was then prepped and draped in usual sterile fashion.  A rigid 22 French cystoscope was passed in the urethra and the bladder.  Bladder was inspected free masses or lesions.  the ureteral orifices were in the normal orthotopic locations. a 6 french ureteral catheter was then instilled into the left ureteral orifice.  a gentle retrograde was obtained and findings noted above. We then advanced a zipwire through the catheter and up to the renal pelvis. Using a grasper the left ureteral stent was removed.  we then removed the cystoscope and cannulated the left  ureteral orifice with a semirigid ureteroscope.  We located a stone in the mid ureter which was removed with an Human resources officer. We then placed a sensor wire up to the renal pelvis. We removed the scope and advanced a 12/14 x 35cm access sheath up to the renal pelvis. We then used the flexible ureteroscope to perform nephroscopy. We located no stones in the left kidney. We then removed the access sheath under direct vision and noted to injury to the ureter.  We then placed a 6 x 26 double-j ureteral stent over the original zip wire. We then removed the wire and good coil was noted in the the renal pelvis under fluoroscopy and the bladder under direct vision.  We then turned out attention to the right side. a 6 french ureteral catheter was then instilled into the right ureteral orifice.  a gentle retrograde was obtained and findings noted above. We then advanced a zipwire through the catheter and up to the renal pelvis.  we then removed the cystoscope and cannulated the right ureteral orifice with a semirigid ureteroscope.  We located no stone in the ureter. We then placed a sensor wire up to the renal pelvis. We removed the scope and advanced a 12/14 x 35cm access sheath up to the renal pelvis. We then used the flexible ureteroscope to perform nephroscopy. We located a calculus at the UPJ. Using a 242nm laser fiber the stone was fragmented and the fragments were removed with an NGage basket. Once the stone were removed we then removed the access sheath under direct vision and noted to injury to the ureter. we then placed a 6 x 26 double-j ureteral stent over the original zip wire.  We then removed the wire and good coil was noted in  the the renal pelvis under fluoroscopy and the bladder under direct vision.   the bladder was then drained and this concluded the procedure which was well tolerated by patient.  Complications: None  Condition: Stable, extubated, transferred to PACU  Plan: Patient is to be discharged  home as to follow-up in 2 weeks. She is to remove her stents by pulling the tether in 72 hours

## 2021-05-06 NOTE — Anesthesia Postprocedure Evaluation (Signed)
Anesthesia Post Note  Patient: LYDIAH PONG  Procedure(s) Performed: CYSTOSCOPY WITH BILATERAL RETROGRADE PYELOGRAM, BILATERAL URETEROSCOPY,  RIGHT URETERAL STENT PLACEMENT (Left) HOLMIUM LASER LITHOTRIPSY RIGHT URETERAL CALCULUS (Left) LEFT URETERAL STONE EXTRACTION WITH BASKET CYSTOSCOPY WITH LEFT URETERAL STENT REPLACEMENT  Patient location during evaluation: Phase II Anesthesia Type: General Level of consciousness: awake Pain management: pain level controlled Vital Signs Assessment: post-procedure vital signs reviewed and stable Respiratory status: spontaneous breathing and respiratory function stable Cardiovascular status: blood pressure returned to baseline and stable Postop Assessment: no headache and no apparent nausea or vomiting Anesthetic complications: no Comments: Late entry   No notable events documented.   Last Vitals:  Vitals:   05/05/21 1545 05/05/21 1551  BP: 118/74 130/81  Pulse: 79 89  Resp:  20  Temp:  36.5 C  SpO2: 96% 97%    Last Pain:  Vitals:   05/05/21 1551  TempSrc: Oral  PainSc: 2                  Windell Norfolk

## 2021-05-10 ENCOUNTER — Encounter (HOSPITAL_COMMUNITY): Payer: Self-pay | Admitting: Urology

## 2021-05-10 LAB — CALCULI, WITH PHOTOGRAPH (CLINICAL LAB)
Calcium Oxalate Dihydrate: 30 %
Calcium Oxalate Monohydrate: 50 %
Hydroxyapatite: 20 %
Weight Calculi: 82 mg

## 2021-05-13 ENCOUNTER — Ambulatory Visit (INDEPENDENT_AMBULATORY_CARE_PROVIDER_SITE_OTHER): Payer: Medicare HMO | Admitting: Urology

## 2021-05-13 ENCOUNTER — Encounter: Payer: Self-pay | Admitting: Urology

## 2021-05-13 ENCOUNTER — Other Ambulatory Visit: Payer: Self-pay

## 2021-05-13 VITALS — BP 125/80 | HR 103 | Temp 97.6°F

## 2021-05-13 DIAGNOSIS — N201 Calculus of ureter: Secondary | ICD-10-CM | POA: Insufficient documentation

## 2021-05-13 NOTE — Progress Notes (Signed)
Urological Symptom Review  Patient is experiencing the following symptoms: Frequent urination Get up at night to urinate   Review of Systems  Gastrointestinal (upper)  : Negative for upper GI symptoms  Gastrointestinal (lower) : Diarrhea  Constitutional : Negative for symptoms  Skin: Negative for skin symptoms  Eyes: Negative for eye symptoms  Ear/Nose/Throat : Negative for Ear/Nose/Throat symptoms  Hematologic/Lymphatic: Negative for Hematologic/Lymphatic symptoms  Cardiovascular : Negative for cardiovascular symptoms  Respiratory : Negative for respiratory symptoms  Endocrine: Negative for endocrine symptoms  Musculoskeletal: Negative for musculoskeletal symptoms  Neurological: Negative for neurological symptoms  Psychologic: Negative for psychiatric symptoms

## 2021-05-13 NOTE — Progress Notes (Signed)
05/13/2021 2:43 PM   Maria Campbell Oct 31, 1966 [id number]  Referring provider: Gwenlyn Fudge, FNP [address] [place],  [place] [address]  Followup nephrolithiasis   HPI: Maria Campbell is a 55yo here for followup for nephrolithiasis. She underwent bilateral ureteroscopic stone extraction 6/16 and removed her tethered stents POD#3. She denies any flank pain. She denies any LUTS. NO hematuria or dysuria. No other complaints today   PMH: Past Medical History:  Diagnosis Date   Barrett's esophagus    CKD (chronic kidney disease) stage 3, GFR 30-59 ml/min (HCC)    GERD (gastroesophageal reflux disease)    Hyperlipidemia    Kidney stones    Lung mass    PET negative on 05/22/18   Seizures (HCC)    Stroke (HCC)    Vitamin D deficiency     Surgical History: Past Surgical History:  Procedure Laterality Date   CYSTOSCOPY W/ URETERAL STENT PLACEMENT  05/05/2021   Procedure: CYSTOSCOPY WITH LEFT URETERAL STENT REPLACEMENT;  Surgeon: Malen Gauze, MD;  Location: [facility];  Service: Urology;;   CYSTOSCOPY WITH RETROGRADE PYELOGRAM, URETEROSCOPY AND STENT PLACEMENT Left 05/05/2021   Procedure: CYSTOSCOPY WITH BILATERAL RETROGRADE PYELOGRAM, BILATERAL URETEROSCOPY,  RIGHT URETERAL STENT PLACEMENT;  Surgeon: Malen Gauze, MD;  Location: [facility];  Service: Urology;  Laterality: Left;   HIP SURGERY Left    HOLMIUM LASER APPLICATION Left 05/05/2021   Procedure: HOLMIUM LASER LITHOTRIPSY RIGHT URETERAL CALCULUS;  Surgeon: Malen Gauze, MD;  Location: [facility];  Service: Urology;  Laterality: Left;   KNEE SURGERY Left    STONE EXTRACTION WITH BASKET  05/05/2021   Procedure: LEFT URETERAL STONE EXTRACTION WITH BASKET;  Surgeon: Malen Gauze, MD;  Location: [facility];  Service: Urology;;    Home Medications:  Allergies as of 05/13/2021       Reactions   Meperidine Hives, Nausea And Vomiting   Fainting and n/v (Demerol)   Other Hives   Dakin's [sodium Hypochlorite] Nausea  And Vomiting   Bleach        Medication List        Accurate as of May 13, 2021  2:43 PM. If you have any questions, ask your nurse or doctor.          aspirin 81 MG EC tablet Take 81 mg by mouth daily.   atorvastatin 10 MG tablet Commonly known as: LIPITOR Take 10 mg by mouth daily.   gabapentin 300 MG capsule Commonly known as: NEURONTIN TAKE 1 CAPSULE(300 MG) BY MOUTH DAILY What changed:  how much to take how to take this when to take this additional instructions   lamoTRIgine 200 MG tablet Commonly known as: LAMICTAL Take 1 tablet (200 mg total) by mouth daily.   nitrofurantoin (macrocrystal-monohydrate) 100 MG capsule Commonly known as: MACROBID Take 1 capsule (100 mg total) by mouth 2 (two) times daily.   nortriptyline 50 MG capsule Commonly known as: PAMELOR TAKE 2 CAPSULES NIGHTLY. What changed:  how much to take how to take this when to take this additional instructions   omeprazole 40 MG capsule Commonly known as: PRILOSEC Take 1 capsule (40 mg total) by mouth daily.   ondansetron 4 MG tablet Commonly known as: ZOFRAN Take 4 mg by mouth every 8 (eight) hours as needed for nausea or vomiting.   oxyCODONE-acetaminophen 5-325 MG tablet Commonly known as: Percocet Take 1 tablet by mouth every 4 (four) hours as needed for severe pain.  Allergies:  Allergies  Allergen Reactions   Meperidine Hives and Nausea And Vomiting    Fainting and n/v (Demerol)    Other Hives   Dakin's [Sodium Hypochlorite] Nausea And Vomiting    Bleach    Family History: Family History  Problem Relation Age of Onset   Heart disease Mother    Heart attack Mother    Heart disease Father    Heart failure Father    Heart disease Maternal Grandmother    Heart disease Maternal Grandfather    Diabetes Paternal Grandmother     Social History:  reports that she has been smoking cigarettes. She has been smoking an average of 1.00 packs per day. She has  never used smokeless tobacco. She reports that she does not drink alcohol and does not use drugs.  ROS: All other review of systems were reviewed and are negative except what is noted above in HPI  Physical Exam: BP 125/80   Pulse (!) 103   Temp 97.6 F (36.4 C)   Constitutional:  Alert and oriented, No acute distress. HEENT: [place] AT, moist mucus membranes.  Trachea midline, no masses. Cardiovascular: No clubbing, cyanosis, or edema. Respiratory: Normal respiratory effort, no increased work of breathing. GI: Abdomen is soft, nontender, nondistended, no abdominal masses GU: No CVA tenderness.  Lymph: No cervical or inguinal lymphadenopathy. Skin: No rashes, bruises or suspicious lesions. Neurologic: Grossly intact, no focal deficits, moving all 4 extremities. Psychiatric: Normal mood and affect.  Laboratory Data: Lab Results  Component Value Date   WBC 5.6 03/17/2021   HGB 11.5 03/17/2021   HCT 37.4 03/17/2021   MCV 80 03/17/2021   PLT 200 03/17/2021    Lab Results  Component Value Date   CREATININE 1.50 (H) 05/04/2021    No results found for: PSA  No results found for: TESTOSTERONE  No results found for: HGBA1C  Urinalysis    Component Value Date/Time   APPEARANCEUR Clear 04/04/2021 1512   GLUCOSEU Negative 04/04/2021 1512   BILIRUBINUR Negative 04/04/2021 1512   PROTEINUR 2+ (A) 04/04/2021 1512   NITRITE Positive (A) 04/04/2021 1512   LEUKOCYTESUR 3+ (A) 04/04/2021 1512    Lab Results  Component Value Date   LABMICR Comment 04/04/2021   WBCUA None seen 08/10/2020   LABEPIT 0-10 08/10/2020   BACTERIA None seen 08/10/2020    Pertinent Imaging:  Results for orders placed during the hospital encounter of 04/04/21  DG Abd 1 View  Narrative CLINICAL DATA:  Nephrolithiasis, ureteral stent  EXAM: ABDOMEN - 1 VIEW  COMPARISON:  CT abdomen pelvis 11/23/2020  FINDINGS: Left ureteral stent is seen. 8 mm right renal calculus is seen. Left total hip  prosthesis partially visualized. Degenerative changes seen in the lower lumbar spine.  IMPRESSION: 1. Left ureteral stent. 2. 8 mm right renal calculus again seen.   Electronically Signed By: Acquanetta Belling M.D. On: 04/06/2021 09:47  No results found for this or any previous visit.  No results found for this or any previous visit.  No results found for this or any previous visit.  No results found for this or any previous visit.  No results found for this or any previous visit.  No results found for this or any previous visit.  No results found for this or any previous visit.   Assessment & Plan:    1. Bilateral ureteral calculi -RTC 6 weeks with renal US - Urinalysis, Routine w reflex microscopic   No follow-ups on file.  Wilkie Aye,  MD  [facility] [place]

## 2021-05-13 NOTE — Patient Instructions (Signed)
Textbook of Natural Medicine (5th ed., pp. 1518-1527.e3). [place], [place]: Elsevier.">  Dietary Guidelines to Help Prevent Kidney Stones Kidney stones are deposits of minerals and salts that form inside your kidneys. Your risk of developing kidney stones may be greater depending on your diet, your lifestyle, the medicines you take, and whether you have certain medical conditions. Most people can lower their chances of developing kidney stones by following the instructions below. Your dietitian may give you more specific instructions depending on your overall health and the type of kidney stones youtend to develop. What are tips for following this plan? Reading food labels  Choose foods with "no salt added" or "low-salt" labels. Limit your salt (sodium) intake to less than 1,500 mg a day. Choose foods with calcium for each meal and snack. Try to eat about 300 mg of calcium at each meal. Foods that contain 200-500 mg of calcium a serving include: 8 oz (237 mL) of milk, calcium-fortifiednon-dairy milk, and calcium-fortifiedfruit juice. Calcium-fortified means that calcium has been added to these drinks. 8 oz (237 mL) of kefir, yogurt, and soy yogurt. 4 oz (114 g) of tofu. 1 oz (28 g) of cheese. 1 cup (150 g) of dried figs. 1 cup (91 g) of cooked broccoli. One 3 oz (85 g) can of sardines or mackerel. Most people need 1,000-1,500 mg of calcium a day. Talk to your dietitian abouthow much calcium is recommended for you. Shopping Buy plenty of fresh fruits and vegetables. Most people do not need to avoid fruits and vegetables, even if these foods contain nutrients that may contribute to kidney stones. When shopping for convenience foods, choose: Whole pieces of fruit. Pre-made salads with dressing on the side. Low-fat fruit and yogurt smoothies. Avoid buying frozen meals or prepared deli foods. These can be high in sodium. Look for foods with live cultures, such as yogurt and kefir. Choose high-fiber  grains, such as whole-wheat breads, oat bran, and wheat cereals. Cooking Do not add salt to food when cooking. Place a salt shaker on the table and allow each person to add his or her own salt to taste. Use vegetable protein, such as beans, textured vegetable protein (TVP), or tofu, instead of meat in pasta, casseroles, and soups. Meal planning Eat less salt, if told by your dietitian. To do this: Avoid eating processed or pre-made food. Avoid eating fast food. Eat less animal protein, including cheese, meat, poultry, or fish, if told by your dietitian. To do this: Limit the number of times you have meat, poultry, fish, or cheese each week. Eat a diet free of meat at least 2 days a week. Eat only one serving each day of meat, poultry, fish, or seafood. When you prepare animal protein, cut pieces into small portion sizes. For most meat and fish, one serving is about the size of the palm of your hand. Eat at least five servings of fresh fruits and vegetables each day. To do this: Keep fruits and vegetables on hand for snacks. Eat one piece of fruit or a handful of berries with breakfast. Have a salad and fruit at lunch. Have two kinds of vegetables at dinner. Limit foods that are high in a substance called oxalate. These include: Spinach (cooked), rhubarb, beets, sweet potatoes, and Swiss chard. Peanuts. Potato chips, french fries, and baked potatoes with skin on. Nuts and nut products. Chocolate. If you regularly take a diuretic medicine, make sure to eat at least 1 or 2 servings of fruits or vegetables that are   high in potassium each day. These include: Avocado. Banana. Orange, prune, carrot, or tomato juice. Baked potato. Cabbage. Beans and split peas. Lifestyle  Drink enough fluid to keep your urine pale yellow. This is the most important thing you can do. Spread your fluid intake throughout the day. If you drink alcohol: Limit how much you use to: 0-1 drink a day for women who  are not pregnant. 0-2 drinks a day for men. Be aware of how much alcohol is in your drink. In the [place] one drink equals one 12 oz bottle of beer (355 mL), one 5 oz glass of wine (148 mL), or one 1 oz glass of hard liquor (44 mL). Lose weight if told by your health care provider. Work with your dietitian to find an eating plan and weight loss strategies that work best for you.  General information Talk to your health care provider and dietitian about taking daily supplements. You may be told the following depending on your health and the cause of your kidney stones: Not to take supplements with vitamin C. To take a calcium supplement. To take a daily probiotic supplement. To take other supplements such as magnesium, fish oil, or vitamin B6. Take over-the-counter and prescription medicines only as told by your health care provider. These include supplements. What foods should I limit? Limit your intake of the following foods, or eat them as told by your dietitian. Vegetables Spinach. Rhubarb. Beets. Canned vegetables. Pickles. Olives. Baked potatoeswith skin. Grains Wheat bran. Baked goods. Salted crackers. Cereals high in sugar. Meats and other proteins Nuts. Nut butters. Large portions of meat, poultry, or fish. Salted, precooked,or cured meats, such as sausages, meat loaves, and hot dogs. Dairy Cheese. Beverages Regular soft drinks. Regular vegetable juice. Seasonings and condiments Seasoning blends with salt. Salad dressings. Soy sauce. Ketchup. Barbecue sauce. Other foods Canned soups. Canned pasta sauce. Casseroles. Pizza. Lasagna. Frozen meals.Potato chips. French fries. The items listed above may not be a complete list of foods and beverages you should limit. Contact a dietitian for more information. What foods should I avoid? Talk to your dietitian about specific foods you should avoid based on the typeof kidney stones you have and your overall health. Fruits Grapefruit. The  item listed above may not be a complete list of foods and beverages you should avoid. Contact a dietitian for more information. Summary Kidney stones are deposits of minerals and salts that form inside your kidneys. You can lower your risk of kidney stones by making changes to your diet. The most important thing you can do is drink enough fluid. Drink enough fluid to keep your urine pale yellow. Talk to your dietitian about how much calcium you should have each day, and eat less salt and animal protein as told by your dietitian. This information is not intended to replace advice given to you by your health care provider. Make sure you discuss any questions you have with your healthcare provider. Document Revised: 10/30/2019 Document Reviewed: 10/30/2019 Elsevier Patient Education  2022 [employer]vier Inc.  

## 2021-06-08 DIAGNOSIS — E785 Hyperlipidemia, unspecified: Secondary | ICD-10-CM | POA: Diagnosis not present

## 2021-06-08 DIAGNOSIS — G43909 Migraine, unspecified, not intractable, without status migrainosus: Secondary | ICD-10-CM | POA: Diagnosis not present

## 2021-06-08 DIAGNOSIS — G40909 Epilepsy, unspecified, not intractable, without status epilepticus: Secondary | ICD-10-CM | POA: Diagnosis not present

## 2021-06-08 DIAGNOSIS — Z6841 Body Mass Index (BMI) 40.0 and over, adult: Secondary | ICD-10-CM | POA: Diagnosis not present

## 2021-06-08 DIAGNOSIS — Z823 Family history of stroke: Secondary | ICD-10-CM | POA: Diagnosis not present

## 2021-06-08 DIAGNOSIS — R32 Unspecified urinary incontinence: Secondary | ICD-10-CM | POA: Diagnosis not present

## 2021-06-08 DIAGNOSIS — G3184 Mild cognitive impairment, so stated: Secondary | ICD-10-CM | POA: Diagnosis not present

## 2021-06-08 DIAGNOSIS — K219 Gastro-esophageal reflux disease without esophagitis: Secondary | ICD-10-CM | POA: Diagnosis not present

## 2021-06-08 DIAGNOSIS — R69 Illness, unspecified: Secondary | ICD-10-CM | POA: Diagnosis not present

## 2021-06-21 ENCOUNTER — Ambulatory Visit (HOSPITAL_COMMUNITY): Payer: Medicare HMO

## 2021-06-28 ENCOUNTER — Telehealth: Payer: Medicare HMO | Admitting: Urology

## 2021-07-07 ENCOUNTER — Ambulatory Visit (HOSPITAL_COMMUNITY): Payer: Medicare HMO

## 2021-07-08 ENCOUNTER — Ambulatory Visit (HOSPITAL_COMMUNITY)
Admission: RE | Admit: 2021-07-08 | Discharge: 2021-07-08 | Disposition: A | Discharge status: Home / Self Care | Payer: Medicare HMO | Source: Ambulatory Visit | Attending: Urology | Admitting: Urology

## 2021-07-08 ENCOUNTER — Other Ambulatory Visit: Payer: Self-pay

## 2021-07-08 DIAGNOSIS — N200 Calculus of kidney: Secondary | ICD-10-CM | POA: Diagnosis not present

## 2021-07-08 DIAGNOSIS — N281 Cyst of kidney, acquired: Secondary | ICD-10-CM | POA: Diagnosis not present

## 2021-07-08 DIAGNOSIS — N201 Calculus of ureter: Secondary | ICD-10-CM | POA: Diagnosis not present

## 2021-07-26 ENCOUNTER — Other Ambulatory Visit: Payer: Self-pay

## 2021-07-26 ENCOUNTER — Encounter: Payer: Self-pay | Admitting: Urology

## 2021-07-26 ENCOUNTER — Telehealth (INDEPENDENT_AMBULATORY_CARE_PROVIDER_SITE_OTHER): Payer: Medicare HMO | Admitting: Urology

## 2021-07-26 DIAGNOSIS — N200 Calculus of kidney: Secondary | ICD-10-CM

## 2021-07-26 NOTE — Patient Instructions (Signed)
Dietary Guidelines to Help Prevent Kidney Stones Kidney stones are deposits of minerals and salts that form inside your kidneys. Your risk of developing kidney stones may be greater depending on your diet, your lifestyle, the medicines you take, and whether you have certain medical conditions. Most people can lower their chances of developing kidney stones by following the instructions below. Your dietitian may give you more specific instructions depending on your overall health and the type of kidney stones you tend to develop. What are tips for following this plan? Reading food labels  Choose foods with "no salt added" or "low-salt" labels. Limit your salt (sodium) intake to less than 1,500 mg a day. Choose foods with calcium for each meal and snack. Try to eat about 300 mg of calcium at each meal. Foods that contain 200-500 mg of calcium a serving include: 8 oz (237 mL) of milk, calcium-fortifiednon-dairy milk, and calcium-fortifiedfruit juice. Calcium-fortified means that calcium has been added to these drinks. 8 oz (237 mL) of kefir, yogurt, and soy yogurt. 4 oz (114 g) of tofu. 1 oz (28 g) of cheese. 1 cup (150 g) of dried figs. 1 cup (91 g) of cooked broccoli. One 3 oz (85 g) can of sardines or mackerel. Most people need 1,000-1,500 mg of calcium a day. Talk to your dietitian about how much calcium is recommended for you. Shopping Buy plenty of fresh fruits and vegetables. Most people do not need to avoid fruits and vegetables, even if these foods contain nutrients that may contribute to kidney stones. When shopping for convenience foods, choose: Whole pieces of fruit. Pre-made salads with dressing on the side. Low-fat fruit and yogurt smoothies. Avoid buying frozen meals or prepared deli foods. These can be high in sodium. Look for foods with live cultures, such as yogurt and kefir. Choose high-fiber grains, such as whole-wheat breads, oat bran, and wheat cereals. Cooking Do not add  salt to food when cooking. Place a salt shaker on the table and allow each person to add his or her own salt to taste. Use vegetable protein, such as beans, textured vegetable protein (TVP), or tofu, instead of meat in pasta, casseroles, and soups. Meal planning Eat less salt, if told by your dietitian. To do this: Avoid eating processed or pre-made food. Avoid eating fast food. Eat less animal protein, including cheese, meat, poultry, or fish, if told by your dietitian. To do this: Limit the number of times you have meat, poultry, fish, or cheese each week. Eat a diet free of meat at least 2 days a week. Eat only one serving each day of meat, poultry, fish, or seafood. When you prepare animal protein, cut pieces into small portion sizes. For most meat and fish, one serving is about the size of the palm of your hand. Eat at least five servings of fresh fruits and vegetables each day. To do this: Keep fruits and vegetables on hand for snacks. Eat one piece of fruit or a handful of berries with breakfast. Have a salad and fruit at lunch. Have two kinds of vegetables at dinner. Limit foods that are high in a substance called oxalate. These include: Spinach (cooked), rhubarb, beets, sweet potatoes, and Swiss chard. Peanuts. Potato chips, french fries, and baked potatoes with skin on. Nuts and nut products. Chocolate. If you regularly take a diuretic medicine, make sure to eat at least 1 or 2 servings of fruits or vegetables that are high in potassium each day. These include: Avocado. Banana. Orange, prune,   carrot, or tomato juice. Baked potato. Cabbage. Beans and split peas. Lifestyle  Drink enough fluid to keep your urine pale yellow. This is the most important thing you can do. Spread your fluid intake throughout the day. If you drink alcohol: Limit how much you use to: 0-1 drink a day for women who are not pregnant. 0-2 drinks a day for men. Be aware of how much alcohol is in your  drink. In the [place] one drink equals one 12 oz bottle of beer (355 mL), one 5 oz glass of wine (148 mL), or one 1 oz glass of hard liquor (44 mL). Lose weight if told by your health care provider. Work with your dietitian to find an eating plan and weight loss strategies that work best for you. General information Talk to your health care provider and dietitian about taking daily supplements. You may be told the following depending on your health and the cause of your kidney stones: Not to take supplements with vitamin C. To take a calcium supplement. To take a daily probiotic supplement. To take other supplements such as magnesium, fish oil, or vitamin B6. Take over-the-counter and prescription medicines only as told by your health care provider. These include supplements. What foods should I limit? Limit your intake of the following foods, or eat them as told by your dietitian. Vegetables Spinach. Rhubarb. Beets. Canned vegetables. Pickles. Olives. Baked potatoes with skin. Grains Wheat bran. Baked goods. Salted crackers. Cereals high in sugar. Meats and other proteins Nuts. Nut butters. Large portions of meat, poultry, or fish. Salted, precooked, or cured meats, such as sausages, meat loaves, and hot dogs. Dairy Cheese. Beverages Regular soft drinks. Regular vegetable juice. Seasonings and condiments Seasoning blends with salt. Salad dressings. Soy sauce. Ketchup. Barbecue sauce. Other foods Canned soups. Canned pasta sauce. Casseroles. Pizza. Lasagna. Frozen meals. Potato chips. French fries. The items listed above may not be a complete list of foods and beverages you should limit. Contact a dietitian for more information. What foods should I avoid? Talk to your dietitian about specific foods you should avoid based on the type of kidney stones you have and your overall health. Fruits Grapefruit. The item listed above may not be a complete list of foods and beverages you should  avoid. Contact a dietitian for more information. Summary Kidney stones are deposits of minerals and salts that form inside your kidneys. You can lower your risk of kidney stones by making changes to your diet. The most important thing you can do is drink enough fluid. Drink enough fluid to keep your urine pale yellow. Talk to your dietitian about how much calcium you should have each day, and eat less salt and animal protein as told by your dietitian. This information is not intended to replace advice given to you by your health care provider. Make sure you discuss any questions you have with your health care provider. Document Revised: 10/30/2019 Document Reviewed: 10/30/2019 Elsevier Patient Education  2022 Elsevier Inc.  

## 2021-07-26 NOTE — Progress Notes (Signed)
07/26/2021 2:10 PM   Maria Campbell [birth year] [id number]  Referring provider: Gwenlyn Fudge, FNP [address] [place],  [place] [address]  Patient location: home Physician location: office I connected with  Maria Campbell on 07/26/21 by a video enabled telemedicine application and verified that I am speaking with the correct person using two identifiers.   I discussed the limitations of evaluation and management by telemedicine. The patient expressed understanding and agreed to proceed.   Followup nephrolithiasis  HPI: Maria Campbell is a 55yo here for folowup for nephrolithiasis. No stone events since last visit. She has intermittent bilateral flank pain that is sharp, mild and nonraditing. Renal US form 8/19 shows bilateral renal calcifications. She denies any LUTS. No other complaints today   PMH: Past Medical History:  Diagnosis Date   Barrett's esophagus    CKD (chronic kidney disease) stage 3, GFR 30-59 ml/min (HCC)    GERD (gastroesophageal reflux disease)    Hyperlipidemia    Kidney stones    Lung mass    PET negative on 05/22/18   Seizures (HCC)    Stroke (HCC)    Vitamin D deficiency     Surgical History: Past Surgical History:  Procedure Laterality Date   CYSTOSCOPY W/ URETERAL STENT PLACEMENT  05/05/2021   Procedure: CYSTOSCOPY WITH LEFT URETERAL STENT REPLACEMENT;  Surgeon: Malen Gauze, MD;  Location: [facility];  Service: Urology;;   CYSTOSCOPY WITH RETROGRADE PYELOGRAM, URETEROSCOPY AND STENT PLACEMENT Left 05/05/2021   Procedure: CYSTOSCOPY WITH BILATERAL RETROGRADE PYELOGRAM, BILATERAL URETEROSCOPY,  RIGHT URETERAL STENT PLACEMENT;  Surgeon: Malen Gauze, MD;  Location: [facility];  Service: Urology;  Laterality: Left;   HIP SURGERY Left    HOLMIUM LASER APPLICATION Left 05/05/2021   Procedure: HOLMIUM LASER LITHOTRIPSY RIGHT URETERAL CALCULUS;  Surgeon: Malen Gauze, MD;  Location: [facility];  Service: Urology;  Laterality: Left;   KNEE SURGERY  Left    STONE EXTRACTION WITH BASKET  05/05/2021   Procedure: LEFT URETERAL STONE EXTRACTION WITH BASKET;  Surgeon: Malen Gauze, MD;  Location: [facility];  Service: Urology;;    Home Medications:  Allergies as of 07/26/2021       Reactions   Meperidine Hives, Nausea And Vomiting   Fainting and n/v (Demerol)   Other Hives   Dakin's [sodium Hypochlorite] Nausea And Vomiting   Bleach        Medication List        Accurate as of July 26, 2021  2:10 PM. If you have any questions, ask your nurse or doctor.          aspirin 81 MG EC tablet Take 81 mg by mouth daily.   atorvastatin 10 MG tablet Commonly known as: LIPITOR Take 10 mg by mouth daily.   gabapentin 300 MG capsule Commonly known as: NEURONTIN TAKE 1 CAPSULE(300 MG) BY MOUTH DAILY What changed:  how much to take how to take this when to take this additional instructions   lamoTRIgine 200 MG tablet Commonly known as: LAMICTAL Take 1 tablet (200 mg total) by mouth daily.   nitrofurantoin (macrocrystal-monohydrate) 100 MG capsule Commonly known as: MACROBID Take 1 capsule (100 mg total) by mouth 2 (two) times daily.   nortriptyline 50 MG capsule Commonly known as: PAMELOR TAKE 2 CAPSULES NIGHTLY. What changed:  how much to take how to take this when to take this additional instructions   omeprazole 40 MG capsule Commonly known as: PRILOSEC Take 1 capsule (40 mg  total) by mouth daily.   ondansetron 4 MG tablet Commonly known as: ZOFRAN Take 4 mg by mouth every 8 (eight) hours as needed for nausea or vomiting.   oxyCODONE-acetaminophen 5-325 MG tablet Commonly known as: Percocet Take 1 tablet by mouth every 4 (four) hours as needed for severe pain.        Allergies:  Allergies  Allergen Reactions   Meperidine Hives and Nausea And Vomiting    Fainting and n/v (Demerol)    Other Hives   Dakin's [Sodium Hypochlorite] Nausea And Vomiting    Bleach    Family History: Family  History  Problem Relation Age of Onset   Heart disease Mother    Heart attack Mother    Heart disease Father    Heart failure Father    Heart disease Maternal Grandmother    Heart disease Maternal Grandfather    Diabetes Paternal Grandmother     Social History:  reports that she has been smoking cigarettes. She has been smoking an average of 1 pack per day. She has never used smokeless tobacco. She reports that she does not drink alcohol and does not use drugs.  ROS: All other review of systems were reviewed and are negative except what is noted above in HPI   Laboratory Data: Lab Results  Component Value Date   WBC 5.6 03/17/2021   HGB 11.5 03/17/2021   HCT 37.4 03/17/2021   MCV 80 03/17/2021   PLT 200 03/17/2021    Lab Results  Component Value Date   CREATININE 1.50 (H) 05/04/2021    No results found for: PSA  No results found for: TESTOSTERONE  No results found for: HGBA1C  Urinalysis    Component Value Date/Time   APPEARANCEUR Clear 04/04/2021 1512   GLUCOSEU Negative 04/04/2021 1512   BILIRUBINUR Negative 04/04/2021 1512   PROTEINUR 2+ (A) 04/04/2021 1512   NITRITE Positive (A) 04/04/2021 1512   LEUKOCYTESUR 3+ (A) 04/04/2021 1512    Lab Results  Component Value Date   LABMICR Comment 04/04/2021   WBCUA None seen 08/10/2020   LABEPIT 0-10 08/10/2020   BACTERIA None seen 08/10/2020    Pertinent Imaging: Renal US 07/08/2021: Images reviewed and discussed with the patient Results for orders placed during the hospital encounter of 04/04/21  DG Abd 1 View  Narrative CLINICAL DATA:  Nephrolithiasis, ureteral stent  EXAM: ABDOMEN - 1 VIEW  COMPARISON:  CT abdomen pelvis 11/23/2020  FINDINGS: Left ureteral stent is seen. 8 mm right renal calculus is seen. Left total hip prosthesis partially visualized. Degenerative changes seen in the lower lumbar spine.  IMPRESSION: 1. Left ureteral stent. 2. 8 mm right renal calculus again  seen.   Electronically Signed By: Acquanetta Belling M.D. On: 04/06/2021 09:47  No results found for this or any previous visit.  No results found for this or any previous visit.  No results found for this or any previous visit.  Results for orders placed during the hospital encounter of 07/08/21  Ultrasound renal complete  Narrative CLINICAL DATA:  Nephrolithiasis.  Bilateral lithotripsy 05/05/2021.  EXAM: RENAL / URINARY TRACT ULTRASOUND COMPLETE  COMPARISON:  Radiograph 04/04/2021.  CT 11/23/2020  FINDINGS: Right Kidney:  Renal measurements: 9.7 x 5.0 x 5.3 cm = volume: 135 mL. Lobulated contour consistent with scarring. No hydronephrosis. Lower pole calcification measures approximately 7 mm. 1.7 cm cyst in the mid kidney. No obvious solid lesion.  Left Kidney:  Renal measurements: 11.6 x 5.3 x 4.4 cm = volume: 141  mL. Lobulated contour consistent with scarring. No hydronephrosis. Robynn Pane 2 shadowing calcifications in the left kidney, larger measuring 7 mm in the interpolar region.  Bladder:  Nondistended and not well evaluated.  Other:  Technically challenging exam due to habitus.  IMPRESSION: 1. No hydronephrosis. 2. Bilateral intrarenal calculi, 7 mm stone on the right and 2 stones on the left, largest 7 mm. 3. Right renal cyst. 4. Urinary bladder nondistended and not well evaluated.   Electronically Signed By: Narda Rutherford M.D. On: 07/09/2021 16:47  No results found for this or any previous visit.  No results found for this or any previous visit.  No results found for this or any previous visit.   Assessment & Plan:    1. Nephrolithiasis -RTC 3 months with renal US   No follow-ups on file.  Wilkie Aye, MD  [facility] [place]

## 2021-07-28 ENCOUNTER — Encounter (INDEPENDENT_AMBULATORY_CARE_PROVIDER_SITE_OTHER): Payer: Self-pay | Admitting: *Deleted

## 2021-07-28 ENCOUNTER — Ambulatory Visit (INDEPENDENT_AMBULATORY_CARE_PROVIDER_SITE_OTHER): Payer: Medicare HMO

## 2021-07-28 VITALS — Ht 62.0 in | Wt 230.0 lb

## 2021-07-28 DIAGNOSIS — Z1211 Encounter for screening for malignant neoplasm of colon: Secondary | ICD-10-CM

## 2021-07-28 DIAGNOSIS — Z122 Encounter for screening for malignant neoplasm of respiratory organs: Secondary | ICD-10-CM

## 2021-07-28 DIAGNOSIS — Z0000 Encounter for general adult medical examination without abnormal findings: Secondary | ICD-10-CM | POA: Diagnosis not present

## 2021-07-28 DIAGNOSIS — R69 Illness, unspecified: Secondary | ICD-10-CM | POA: Diagnosis not present

## 2021-07-28 DIAGNOSIS — Z720 Tobacco use: Secondary | ICD-10-CM

## 2021-07-28 DIAGNOSIS — F17200 Nicotine dependence, unspecified, uncomplicated: Secondary | ICD-10-CM

## 2021-07-28 NOTE — Patient Instructions (Addendum)
Ms. Forker , Thank you for taking time to come for your Medicare Wellness Visit. I appreciate your ongoing commitment to your health goals. Please review the following plan we discussed and let me know if I can assist you in the future.   Screening recommendations/referrals: Colonoscopy: Due. Ordered today Mammogram: Due. Make appointment when convenient Bone Density: Due at age 56  Low Dose Chest CT Lung Cancer Screening (recommended for anyone ages 30-80 who have smoked for 20 years or more and are currently smoking or have quit in the last 15 years: Ordered today  Recommended yearly ophthalmology/optometry visit for glaucoma screening and checkup Recommended yearly dental visit for hygiene and checkup  Vaccinations: Influenza vaccine: Due every fall Pneumococcal vaccine: Due. 2 doses one year apart Tdap vaccine: Due every 10 years Shingles vaccine: Due. Shingrix discussed. Please contact your pharmacy for coverage information.    Covid-19: Had one dose, unsure of date - declines additional doses  Advanced directives: Please bring a copy of your health care power of attorney and living will to the office to be added to your chart at your convenience.   Conditions/risks identified: Aim for 30 minutes of exercise or brisk walking each day, drink 6-8 glasses of water and eat lots of fruits and vegetables.   Next appointment: Follow up in one year for your annual wellness visit.   Preventive Care 40-64 Years, Female Preventive care refers to lifestyle choices and visits with your health care provider that can promote health and wellness. What does preventive care include? A yearly physical exam. This is also called an annual well check. Dental exams once or twice a year. Routine eye exams. Ask your health care provider how often you should have your eyes checked. Personal lifestyle choices, including: Daily care of your teeth and gums. Regular physical activity. Eating a healthy  diet. Avoiding tobacco and drug use. Limiting alcohol use. Practicing safe sex. Taking low-dose aspirin daily starting at age 13. Taking vitamin and mineral supplements as recommended by your health care provider. What happens during an annual well check? The services and screenings done by your health care provider during your annual well check will depend on your age, overall health, lifestyle risk factors, and family history of disease. Counseling  Your health care provider may ask you questions about your: Alcohol use. Tobacco use. Drug use. Emotional well-being. Home and relationship well-being. Sexual activity. Eating habits. Work and work Statistician. Method of birth control. Menstrual cycle. Pregnancy history. Screening  You may have the following tests or measurements: Height, weight, and BMI. Blood pressure. Lipid and cholesterol levels. These may be checked every 5 years, or more frequently if you are over 48 years old. Skin check. Lung cancer screening. You may have this screening every year starting at age 56 if you have a 30-pack-year history of smoking and currently smoke or have quit within the past 15 years. Fecal occult blood test (FOBT) of the stool. You may have this test every year starting at age 59. Flexible sigmoidoscopy or colonoscopy. You may have a sigmoidoscopy every 5 years or a colonoscopy every 10 years starting at age 35. Hepatitis C blood test. Hepatitis B blood test. Sexually transmitted disease (STD) testing. Diabetes screening. This is done by checking your blood sugar (glucose) after you have not eaten for a while (fasting). You may have this done every 1-3 years. Mammogram. This may be done every 1-2 years. Talk to your health care provider about when you should start having  regular mammograms. This may depend on whether you have a family history of breast cancer. BRCA-related cancer screening. This may be done if you have a family history of  breast, ovarian, tubal, or peritoneal cancers. Pelvic exam and Pap test. This may be done every 3 years starting at age 76. Starting at age 91, this may be done every 5 years if you have a Pap test in combination with an HPV test. Bone density scan. This is done to screen for osteoporosis. You may have this scan if you are at high risk for osteoporosis. Discuss your test results, treatment options, and if necessary, the need for more tests with your health care provider. Vaccines  Your health care provider may recommend certain vaccines, such as: Influenza vaccine. This is recommended every year. Tetanus, diphtheria, and acellular pertussis (Tdap, Td) vaccine. You may need a Td booster every 10 years. Zoster vaccine. You may need this after age 63. Pneumococcal 13-valent conjugate (PCV13) vaccine. You may need this if you have certain conditions and were not previously vaccinated. Pneumococcal polysaccharide (PPSV23) vaccine. You may need one or two doses if you smoke cigarettes or if you have certain conditions. Talk to your health care provider about which screenings and vaccines you need and how often you need them. This information is not intended to replace advice given to you by your health care provider. Make sure you discuss any questions you have with your health care provider. Document Released: 12/03/2015 Document Revised: 07/26/2016 Document Reviewed: 09/07/2015 Elsevier Interactive Patient Education  2017 [place] Prevention in the Home Falls can cause injuries. They can happen to people of all ages. There are many things you can do to make your home safe and to help prevent falls. What can I do on the outside of my home? Regularly fix the edges of walkways and driveways and fix any cracks. Remove anything that might make you trip as you walk through a door, such as a raised step or threshold. Trim any bushes or trees on the path to your home. Use bright outdoor  lighting. Clear any walking paths of anything that might make someone trip, such as rocks or tools. Regularly check to see if handrails are loose or broken. Make sure that both sides of any steps have handrails. Any raised decks and porches should have guardrails on the edges. Have any leaves, snow, or ice cleared regularly. Use sand or salt on walking paths during winter. Clean up any spills in your garage right away. This includes oil or grease spills. What can I do in the bathroom? Use night lights. Install grab bars by the toilet and in the tub and shower. Do not use towel bars as grab bars. Use non-skid mats or decals in the tub or shower. If you need to sit down in the shower, use a plastic, non-slip stool. Keep the floor dry. Clean up any water that spills on the floor as soon as it happens. Remove soap buildup in the tub or shower regularly. Attach bath mats securely with double-sided non-slip rug tape. Do not have throw rugs and other things on the floor that can make you trip. What can I do in the bedroom? Use night lights. Make sure that you have a light by your bed that is easy to reach. Do not use any sheets or blankets that are too big for your bed. They should not hang down onto the floor. Have a firm chair that has  side arms. You can use this for support while you get dressed. Do not have throw rugs and other things on the floor that can make you trip. What can I do in the kitchen? Clean up any spills right away. Avoid walking on wet floors. Keep items that you use a lot in easy-to-reach places. If you need to reach something above you, use a strong step stool that has a grab bar. Keep electrical cords out of the way. Do not use floor polish or wax that makes floors slippery. If you must use wax, use non-skid floor wax. Do not have throw rugs and other things on the floor that can make you trip. What can I do with my stairs? Do not leave any items on the stairs. Make  sure that there are handrails on both sides of the stairs and use them. Fix handrails that are broken or loose. Make sure that handrails are as long as the stairways. Check any carpeting to make sure that it is firmly attached to the stairs. Fix any carpet that is loose or worn. Avoid having throw rugs at the top or bottom of the stairs. If you do have throw rugs, attach them to the floor with carpet tape. Make sure that you have a light switch at the top of the stairs and the bottom of the stairs. If you do not have them, ask someone to add them for you. What else can I do to help prevent falls? Wear shoes that: Do not have high heels. Have rubber bottoms. Are comfortable and fit you well. Are closed at the toe. Do not wear sandals. If you use a stepladder: Make sure that it is fully opened. Do not climb a closed stepladder. Make sure that both sides of the stepladder are locked into place. Ask someone to hold it for you, if possible. Clearly mark and make sure that you can see: Any grab bars or handrails. First and last steps. Where the edge of each step is. Use tools that help you move around (mobility aids) if they are needed. These include: Canes. Walkers. Scooters. Crutches. Turn on the lights when you go into a dark area. Replace any light bulbs as soon as they burn out. Set up your furniture so you have a clear path. Avoid moving your furniture around. If any of your floors are uneven, fix them. If there are any pets around you, be aware of where they are. Review your medicines with your doctor. Some medicines can make you feel dizzy. This can increase your chance of falling. Ask your doctor what other things that you can do to help prevent falls. This information is not intended to replace advice given to you by your health care provider. Make sure you discuss any questions you have with your health care provider. Document Released: 09/02/2009 Document Revised: 04/13/2016  Document Reviewed: 12/11/2014 Elsevier Interactive Patient Education  2017 [employer].

## 2021-07-28 NOTE — Progress Notes (Signed)
Subjective:   Maria Campbell is a 55 y.o. female who presents for an Initial Medicare Annual Wellness Visit.  Virtual Visit via Telephone Note  I connected with  Maria Campbell on 07/28/21 at  1:15 PM EDT by telephone and verified that I am speaking with the correct person using two identifiers.  Location: Patient: Home Provider: [facility] Persons participating in the virtual visit: patient/Nurse Health Advisor   I discussed the limitations, risks, security and privacy concerns of performing an evaluation and management service by telephone and the availability of in person appointments. The patient expressed understanding and agreed to proceed.  Interactive audio and video telecommunications were attempted between this nurse and patient, however failed, due to patient having technical difficulties OR patient did not have access to video capability.  We continued and completed visit with audio only.  Some vital signs may be absent or patient reported.   Zael Shuman E Dorethia Jeanmarie, LPN   Review of Systems     Cardiac Risk Factors include: dyslipidemia;hypertension;obesity (BMI >30kg/m2);sedentary lifestyle;smoking/ tobacco exposure;Other (see comment), Risk factor comments: hx of TIA     Objective:    Today's Vitals   07/28/21 1302 07/28/21 1303  Weight: 230 lb (104.3 kg)   Height: 5\' 2"  (1.575 m)   PainSc:  3    Body mass index is 42.07 kg/m.  Advanced Directives 07/28/2021 05/05/2021 05/04/2021  Does Patient Have a Medical Advance Directive? No No No  Would patient like information on creating a medical advance directive? No - Patient declined No - Patient declined No - Patient declined    Current Medications (verified) Outpatient Encounter Medications as of 07/28/2021  Medication Sig   aspirin 81 MG EC tablet Take 81 mg by mouth daily.   atorvastatin (LIPITOR) 10 MG tablet Take 10 mg by mouth daily.   gabapentin (NEURONTIN) 300 MG capsule TAKE 1 CAPSULE(300 MG) BY MOUTH DAILY (Patient  taking differently: Take 300 mg by mouth daily.)   lamoTRIgine (LAMICTAL) 200 MG tablet Take 1 tablet (200 mg total) by mouth daily.   nortriptyline (PAMELOR) 50 MG capsule TAKE 2 CAPSULES NIGHTLY. (Patient taking differently: Take 50 mg by mouth at bedtime.)   omeprazole (PRILOSEC) 40 MG capsule Take 1 capsule (40 mg total) by mouth daily.   nitrofurantoin, macrocrystal-monohydrate, (MACROBID) 100 MG capsule Take 1 capsule (100 mg total) by mouth 2 (two) times daily. (Patient not taking: Reported on 07/28/2021)   ondansetron (ZOFRAN) 4 MG tablet Take 4 mg by mouth every 8 (eight) hours as needed for nausea or vomiting.  (Patient not taking: Reported on 07/28/2021)   oxyCODONE-acetaminophen (PERCOCET) 5-325 MG tablet Take 1 tablet by mouth every 4 (four) hours as needed for severe pain. (Patient not taking: Reported on 07/28/2021)   No facility-administered encounter medications on file as of 07/28/2021.    Allergies (verified) Meperidine, Other, and Dakin's [sodium hypochlorite]   History: Past Medical History:  Diagnosis Date   Barrett's esophagus    CKD (chronic kidney disease) stage 3, GFR 30-59 ml/min (HCC)    GERD (gastroesophageal reflux disease)    Hyperlipidemia    Kidney stones    Lung mass    PET negative on 05/22/18   Seizures (HCC)    Stroke (HCC)    Vitamin D deficiency    Past Surgical History:  Procedure Laterality Date   CYSTOSCOPY W/ URETERAL STENT PLACEMENT  05/05/2021   Procedure: CYSTOSCOPY WITH LEFT URETERAL STENT REPLACEMENT;  Surgeon: 05/07/2021, MD;  Location: [facility];  Service: Urology;;   CYSTOSCOPY WITH RETROGRADE PYELOGRAM, URETEROSCOPY AND STENT PLACEMENT Left 05/05/2021   Procedure: CYSTOSCOPY WITH BILATERAL RETROGRADE PYELOGRAM, BILATERAL URETEROSCOPY,  RIGHT URETERAL STENT PLACEMENT;  Surgeon: Malen GauzeMcKenzie, Patrick L, MD;  Location: [facility];  Service: Urology;  Laterality: Left;   HIP SURGERY Left    HOLMIUM LASER APPLICATION Left 05/05/2021   Procedure:  HOLMIUM LASER LITHOTRIPSY RIGHT URETERAL CALCULUS;  Surgeon: Malen GauzeMcKenzie, Patrick L, MD;  Location: [facility];  Service: Urology;  Laterality: Left;   KNEE SURGERY Left    STONE EXTRACTION WITH BASKET  05/05/2021   Procedure: LEFT URETERAL STONE EXTRACTION WITH BASKET;  Surgeon: Malen GauzeMcKenzie, Patrick L, MD;  Location: [facility];  Service: Urology;;   Family History  Problem Relation Age of Onset   Heart disease Mother    Heart attack Mother    Heart disease Father    Heart failure Father    Heart disease Maternal Grandmother    Heart disease Maternal Grandfather    Diabetes Paternal Grandmother    Social History   Socioeconomic History   Marital status: Single    Spouse name: Not on file   Number of children: 1   Years of education: Not on file   Highest education level: Not on file  Occupational History   Occupation: disability due to seizures and strokes  Tobacco Use   Smoking status: Every Day    Packs/day: 1.00    Years: 35.00    Pack years: 35.00    Types: Cigarettes   Smokeless tobacco: Never  Vaping Use   Vaping Use: Never used  Substance and Sexual Activity   Alcohol use: Never   Drug use: Never   Sexual activity: Not Currently    Birth control/protection: None  Other Topics Concern   Not on file  Social History Narrative   Lives with a roommate - has one son - he lives hours away   Social Determinants of Health   Financial Resource Strain: Low Risk    Difficulty of Paying Living Expenses: Not hard at all  Food Insecurity: No Food Insecurity   Worried About Programme researcher, broadcasting/film/videounning Out of Food in the Last Year: Never true   Baristaan Out of Food in the Last Year: Never true  Transportation Needs: No Transportation Needs   Lack of Transportation (Medical): No   Lack of Transportation (Non-Medical): No  Physical Activity: Insufficiently Active   Days of Exercise per Week: 3 days   Minutes of Exercise per Session: 30 min  Stress: No Stress Concern Present   Feeling of Stress : Only a little   Social Connections: Moderately Isolated   Frequency of Communication with Friends and Family: More than three times a week   Frequency of Social Gatherings with Friends and Family: More than three times a week   Attends Religious Services: Never   Database administratorActive Member of Clubs or Organizations: No   Attends Engineer, structuralClub or Organization Meetings: Never   Marital Status: Living with partner    Tobacco Counseling Ready to quit: No Counseling given: Yes   Clinical Intake:  Pre-visit preparation completed: Yes  Pain : 0-10 Pain Score: 3  Pain Type: Chronic pain Pain Location: Back Pain Descriptors / Indicators: Aching, Discomfort Pain Onset: More than a month ago Pain Frequency: Intermittent     BMI - recorded: 42.07 Nutritional Status: BMI > 30  Obese Nutritional Risks: None Diabetes: No  How often do you need to have someone help you when you read instructions, pamphlets,  or other written materials from your doctor or pharmacy?: 1 - Never  Diabetic? No  Interpreter Needed?: No  Information entered by :: Pj Zehner, LPN   Activities of Daily Living In your present state of health, do you have any difficulty performing the following activities: 07/28/2021 05/04/2021  Hearing? Y N  Comment mild intermittent -  Vision? N N  Difficulty concentrating or making decisions? Y N  Comment memory loss since stroke -  Walking or climbing stairs? Y N  Comment hurts back -  Dressing or bathing? N N  Doing errands, shopping? N N  Preparing Food and eating ? N -  Using the Toilet? N -  In the past six months, have you accidently leaked urine? Y -  Comment rarely -  Do you have problems with loss of bowel control? N -  Managing your Medications? N -  Managing your Finances? N -  Housekeeping or managing your Housekeeping? N -  Some recent data might be hidden    Patient Care Team: Gwenlyn Fudge, FNP as PCP - General (Family Medicine) Ronne Binning Mardene Celeste, MD as Consulting Physician  (Urology) Michaelle Copas, MD as Referring Physician (Optometry)  Indicate any recent Medical Services you may have received from other than [facility] providers in the past year (date may be approximate).     Assessment:   This is a routine wellness examination for Maria Campbell.  Hearing/Vision screen Hearing Screening - Comments:: C/o mild intermittent hearing difficulties  Vision Screening - Comments:: Wears glasses - up to date with annual eye exams with Dr Conley Rolls in [place]  Dietary issues and exercise activities discussed: Current Exercise Habits: Home exercise routine, Type of exercise: walking, Time (Minutes): 30, Frequency (Times/Week): 3, Weekly Exercise (Minutes/Week): 90, Intensity: Mild, Exercise limited by: orthopedic condition(s);neurologic condition(s)   Goals Addressed             This Visit's Progress    Exercise 3x per week (30 min per time)       Weight under 200 by next year at this time       Depression Screen PHQ 2/9 Scores 07/28/2021 03/17/2021 10/08/2020 08/10/2020 02/10/2020  PHQ - 2 Score 0 1 0 0 6  PHQ- 9 Score - 9 - - 15    Fall Risk Fall Risk  07/28/2021 03/17/2021 10/08/2020 02/10/2020  Falls in the past year? 0 1 1 1   Number falls in past yr: 0 1 1 0  Injury with Fall? 0 0 0 0  Risk for fall due to : Medication side effect;Impaired vision;Orthopedic patient - - -  Follow up Education provided;Falls prevention discussed Falls prevention discussed Falls prevention discussed -    FALL RISK PREVENTION PERTAINING TO THE HOME:  Any stairs in or around the home? Yes  If so, are there any without handrails? No  Home free of loose throw rugs in walkways, pet beds, electrical cords, etc? Yes  Adequate lighting in your home to reduce risk of falls? Yes   ASSISTIVE DEVICES UTILIZED TO PREVENT FALLS:  Life alert? No  Use of a cane, walker or w/c? No  Grab bars in the bathroom? No  Shower chair or bench in shower? No  Elevated toilet seat or a handicapped toilet? No    TIMED UP AND GO:  Was the test performed? No . Telephonic visit  Cognitive Function:     6CIT Screen 07/28/2021  What Year? 0 points  What month? 0 points  What time? 0  points  Count back from 20 0 points  Months in reverse 2 points  Repeat phrase 6 points  Total Score 8    Immunizations  There is no immunization history on file for this patient.  TDAP status: Due, Education has been provided regarding the importance of this vaccine. Advised may receive this vaccine at local pharmacy or Health Dept. Aware to provide a copy of the vaccination record if obtained from local pharmacy or Health Dept. Verbalized acceptance and understanding.  Flu Vaccine status: Due, Education has been provided regarding the importance of this vaccine. Advised may receive this vaccine at local pharmacy or Health Dept. Aware to provide a copy of the vaccination record if obtained from local pharmacy or Health Dept. Verbalized acceptance and understanding.  Pneumococcal vaccine status: Due, Education has been provided regarding the importance of this vaccine. Advised may receive this vaccine at local pharmacy or Health Dept. Aware to provide a copy of the vaccination record if obtained from local pharmacy or Health Dept. Verbalized acceptance and understanding.  Covid-19 vaccine status: Declined, Education has been provided regarding the importance of this vaccine but patient still declined. Advised may receive this vaccine at local pharmacy or Health Dept.or vaccine clinic. Aware to provide a copy of the vaccination record if obtained from local pharmacy or Health Dept. Verbalized acceptance and understanding.  Qualifies for Shingles Vaccine? Yes   Zostavax completed No   Shingrix Completed?: No.    Education has been provided regarding the importance of this vaccine. Patient has been advised to call insurance company to determine out of pocket expense if they have not yet received this vaccine. Advised may  also receive vaccine at local pharmacy or Health Dept. Verbalized acceptance and understanding.  Screening Tests Health Maintenance  Topic Date Due   COVID-19 Vaccine (1) Never done   Pneumococcal Vaccine 66-58 Years old (1 - PCV) Never done   Zoster Vaccines- Shingrix (1 of 2) Never done   PAP SMEAR-Modifier  Never done   INFLUENZA VACCINE  Never done   TETANUS/TDAP  10/08/2021 (Originally 04/29/1984)   Hepatitis C Screening  10/08/2021 (Originally 04/30/1983)   Fecal DNA (Cologuard)  10/08/2021 (Originally 04/30/2015)   MAMMOGRAM  03/17/2022 (Originally 04/30/2015)   HIV Screening  03/17/2022 (Originally 04/29/1980)   HPV VACCINES  Aged Out    Health Maintenance  Health Maintenance Due  Topic Date Due   COVID-19 Vaccine (1) Never done   Pneumococcal Vaccine 68-59 Years old (1 - PCV) Never done   Zoster Vaccines- Shingrix (1 of 2) Never done   PAP SMEAR-Modifier  Never done   INFLUENZA VACCINE  Never done    Colorectal cancer screening: Referral to GI placed 07/28/21. Pt aware the office will call re: appt.  Mammogram status: Patient chooses to postpone at this time  Bone Density status: Patient due at age 34  Lung Cancer Screening: (Low Dose CT Chest recommended if Age 24-80 years, 30 pack-year currently smoking OR have quit w/in 15years.) does qualify.   Lung Cancer Screening Referral: ordered today  Additional Screening:  Hepatitis C Screening: does not qualify  Vision Screening: Recommended annual ophthalmology exams for early detection of glaucoma and other disorders of the eye. Is the patient up to date with their annual eye exam?  Yes  Who is the provider or what is the name of the office in which the patient attends annual eye exams? [facility] If pt is not established with a provider, would they like to be  referred to a provider to establish care? No .   Dental Screening: Recommended annual dental exams for proper oral hygiene  Community Resource Referral / Chronic Care  Management: CRR required this visit?  No   CCM required this visit?  No      Plan:     I have personally reviewed and noted the following in the patient's chart:   Medical and social history Use of alcohol, tobacco or illicit drugs  Current medications and supplements including opioid prescriptions. Patient is not currently taking opioid prescriptions. Functional ability and status Nutritional status Physical activity Advanced directives List of other physicians Hospitalizations, surgeries, and ER visits in previous 12 months Vitals Screenings to include cognitive, depression, and falls Referrals and appointments  In addition, I have reviewed and discussed with patient certain preventive protocols, quality metrics, and best practice recommendations. A written personalized care plan for preventive services as well as general preventive health recommendations were provided to patient.     [place] Constable, LPN   04/22/1496   Nurse Notes: None

## 2021-08-10 ENCOUNTER — Encounter (INDEPENDENT_AMBULATORY_CARE_PROVIDER_SITE_OTHER): Payer: Self-pay | Admitting: *Deleted

## 2021-09-14 ENCOUNTER — Ambulatory Visit (INDEPENDENT_AMBULATORY_CARE_PROVIDER_SITE_OTHER): Payer: Medicare HMO | Admitting: Family Medicine

## 2021-09-14 ENCOUNTER — Encounter: Payer: Self-pay | Admitting: Family Medicine

## 2021-09-14 ENCOUNTER — Other Ambulatory Visit: Payer: Self-pay

## 2021-09-14 VITALS — BP 117/86 | HR 88 | Temp 97.9°F | Ht 62.0 in | Wt 229.6 lb

## 2021-09-14 DIAGNOSIS — R69 Illness, unspecified: Secondary | ICD-10-CM | POA: Diagnosis not present

## 2021-09-14 DIAGNOSIS — L0291 Cutaneous abscess, unspecified: Secondary | ICD-10-CM

## 2021-09-14 DIAGNOSIS — S3092XA Unspecified superficial injury of abdominal wall, initial encounter: Secondary | ICD-10-CM

## 2021-09-14 DIAGNOSIS — Z720 Tobacco use: Secondary | ICD-10-CM

## 2021-09-14 DIAGNOSIS — Z1211 Encounter for screening for malignant neoplasm of colon: Secondary | ICD-10-CM

## 2021-09-14 DIAGNOSIS — T148XXA Other injury of unspecified body region, initial encounter: Secondary | ICD-10-CM

## 2021-09-14 DIAGNOSIS — E559 Vitamin D deficiency, unspecified: Secondary | ICD-10-CM

## 2021-09-14 DIAGNOSIS — Z87898 Personal history of other specified conditions: Secondary | ICD-10-CM

## 2021-09-14 DIAGNOSIS — T300 Burn of unspecified body region, unspecified degree: Secondary | ICD-10-CM

## 2021-09-14 DIAGNOSIS — G43009 Migraine without aura, not intractable, without status migrainosus: Secondary | ICD-10-CM

## 2021-09-14 DIAGNOSIS — Z8673 Personal history of transient ischemic attack (TIA), and cerebral infarction without residual deficits: Secondary | ICD-10-CM | POA: Diagnosis not present

## 2021-09-14 DIAGNOSIS — N1832 Chronic kidney disease, stage 3b: Secondary | ICD-10-CM | POA: Diagnosis not present

## 2021-09-14 DIAGNOSIS — Z9189 Other specified personal risk factors, not elsewhere classified: Secondary | ICD-10-CM

## 2021-09-14 DIAGNOSIS — T2101XA Burn of unspecified degree of chest wall, initial encounter: Secondary | ICD-10-CM

## 2021-09-14 DIAGNOSIS — E782 Mixed hyperlipidemia: Secondary | ICD-10-CM

## 2021-09-14 DIAGNOSIS — K219 Gastro-esophageal reflux disease without esophagitis: Secondary | ICD-10-CM

## 2021-09-14 DIAGNOSIS — R0602 Shortness of breath: Secondary | ICD-10-CM

## 2021-09-14 DIAGNOSIS — F3341 Major depressive disorder, recurrent, in partial remission: Secondary | ICD-10-CM

## 2021-09-14 DIAGNOSIS — F411 Generalized anxiety disorder: Secondary | ICD-10-CM

## 2021-09-14 MED ORDER — ALBUTEROL SULFATE HFA 108 (90 BASE) MCG/ACT IN AERS: 2.0000 | INHALATION_SPRAY | Freq: Four times a day (QID) | RESPIRATORY_TRACT | 2 refills | Status: DC | PRN

## 2021-09-14 MED ORDER — ATORVASTATIN CALCIUM 20 MG PO TABS: 20.0000 mg | ORAL_TABLET | Freq: Every day | ORAL | 2 refills | Status: DC
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
## 2021-09-14 NOTE — Patient Instructions (Signed)
Take an over the counter vitamin D3 2,000 units once daily.

## 2021-09-15 ENCOUNTER — Telehealth: Payer: Self-pay | Admitting: Family Medicine

## 2021-09-15 LAB — CBC WITH DIFFERENTIAL/PLATELET
Basophils Absolute: 0.1 10*3/uL (ref 0.0–0.2)
Basos: 1 %
EOS (ABSOLUTE): 0.2 10*3/uL (ref 0.0–0.4)
Eos: 3 %
Hematocrit: 38.5 % (ref 34.0–46.6)
Hemoglobin: 11.9 g/dL (ref 11.1–15.9)
Immature Grans (Abs): 0 10*3/uL (ref 0.0–0.1)
Immature Granulocytes: 1 %
Lymphocytes Absolute: 1.6 10*3/uL (ref 0.7–3.1)
Lymphs: 26 %
MCH: 23.8 pg — ABNORMAL LOW (ref 26.6–33.0)
MCHC: 30.9 g/dL — ABNORMAL LOW (ref 31.5–35.7)
MCV: 77 fL — ABNORMAL LOW (ref 79–97)
Monocytes Absolute: 0.4 10*3/uL (ref 0.1–0.9)
Monocytes: 7 %
Neutrophils Absolute: 3.8 10*3/uL (ref 1.4–7.0)
Neutrophils: 62 %
Platelets: 238 10*3/uL (ref 150–450)
RBC: 5.01 x10E6/uL (ref 3.77–5.28)
RDW: 16.6 % — ABNORMAL HIGH (ref 11.7–15.4)
WBC: 6.1 10*3/uL (ref 3.4–10.8)

## 2021-09-15 LAB — CMP14+EGFR
ALT: 10 IU/L (ref 0–32)
AST: 13 IU/L (ref 0–40)
Albumin/Globulin Ratio: 1.8 (ref 1.2–2.2)
Albumin: 4.2 g/dL (ref 3.8–4.9)
Alkaline Phosphatase: 129 IU/L — ABNORMAL HIGH (ref 44–121)
BUN/Creatinine Ratio: 5 — ABNORMAL LOW (ref 9–23)
BUN: 7 mg/dL (ref 6–24)
Bilirubin Total: <0.2 mg/dL (ref 0.0–1.2)
CO2: 23 mmol/L (ref 20–29)
Calcium: 9.6 mg/dL (ref 8.7–10.2)
Chloride: 99 mmol/L (ref 96–106)
Creatinine, Ser: 1.5 mg/dL — ABNORMAL HIGH (ref 0.57–1.00)
Globulin, Total: 2.4 g/dL (ref 1.5–4.5)
Glucose: 97 mg/dL (ref 70–99)
Potassium: 4.5 mmol/L (ref 3.5–5.2)
Sodium: 137 mmol/L (ref 134–144)
Total Protein: 6.6 g/dL (ref 6.0–8.5)
eGFR: 41 mL/min/{1.73_m2} — ABNORMAL LOW (ref >59)

## 2021-09-15 LAB — LIPID PANEL
Chol/HDL Ratio: 4.4 ratio (ref 0.0–4.4)
Cholesterol, Total: 188 mg/dL (ref 100–199)
HDL: 43 mg/dL (ref >39)
LDL Chol Calc (NIH): 120 mg/dL — ABNORMAL HIGH (ref 0–99)
Triglycerides: 140 mg/dL (ref 0–149)
VLDL Cholesterol Cal: 25 mg/dL (ref 5–40)

## 2021-09-15 LAB — RENAL FUNCTION PANEL: Phosphorus: 3.7 mg/dL (ref 3.0–4.3)

## 2021-09-15 MED ORDER — DAPAGLIFLOZIN PROPANEDIOL 10 MG PO TABS: 10.0000 mg | ORAL_TABLET | Freq: Every day | ORAL | 2 refills | Status: DC

## 2021-09-15 NOTE — Telephone Encounter (Signed)
Patient aware and verbalized understanding. °

## 2021-09-16 ENCOUNTER — Encounter: Payer: Self-pay | Admitting: Family Medicine

## 2021-10-01 ENCOUNTER — Other Ambulatory Visit: Payer: Self-pay | Admitting: Family Medicine

## 2021-10-01 DIAGNOSIS — Z87898 Personal history of other specified conditions: Secondary | ICD-10-CM

## 2021-10-04 ENCOUNTER — Telehealth: Payer: Self-pay | Admitting: Family Medicine

## 2021-10-04 DIAGNOSIS — K219 Gastro-esophageal reflux disease without esophagitis: Secondary | ICD-10-CM

## 2021-10-04 MED ORDER — OMEPRAZOLE 40 MG PO CPDR: 40.0000 mg | DELAYED_RELEASE_CAPSULE | Freq: Every day | ORAL | 1 refills | Status: DC

## 2021-10-04 NOTE — Telephone Encounter (Signed)
Pt aware refill sent to [facility]. 

## 2021-10-04 NOTE — Telephone Encounter (Signed)
  Prescription Request  10/04/2021  Is this a "Controlled Substance" medicine? no  Have you seen your PCP in the last 2 weeks? no  If YES, route message to pool  -  If NO, patient needs to be scheduled for appointment.  What is the name of the medication or equipment? omeprazole (PRILOSEC) 40 MG capsule  Have you contacted your pharmacy to request a refill? yes   Which pharmacy would you like this sent to? [facility]    Patient notified that their request is being sent to the clinical staff for review and that they should receive a response within 2 business days.

## 2021-10-21 ENCOUNTER — Ambulatory Visit (HOSPITAL_COMMUNITY): Payer: Medicare HMO | Attending: Urology

## 2021-10-28 ENCOUNTER — Encounter: Payer: Self-pay | Admitting: Urology

## 2021-10-28 ENCOUNTER — Other Ambulatory Visit: Payer: Self-pay

## 2021-10-28 ENCOUNTER — Ambulatory Visit (INDEPENDENT_AMBULATORY_CARE_PROVIDER_SITE_OTHER): Payer: Medicare HMO | Admitting: Urology

## 2021-10-28 VITALS — BP 109/68 | HR 101

## 2021-10-28 DIAGNOSIS — N200 Calculus of kidney: Secondary | ICD-10-CM | POA: Diagnosis not present

## 2021-10-28 DIAGNOSIS — N3281 Overactive bladder: Secondary | ICD-10-CM | POA: Diagnosis not present

## 2021-10-28 DIAGNOSIS — N201 Calculus of ureter: Secondary | ICD-10-CM | POA: Diagnosis not present

## 2021-10-28 LAB — MICROSCOPIC EXAMINATION
Epithelial Cells (non renal): >10 /hpf — AB (ref 0–10)
Renal Epithel, UA: NONE SEEN /hpf

## 2021-10-28 LAB — URINALYSIS, ROUTINE W REFLEX MICROSCOPIC
Bilirubin, UA: NEGATIVE
Glucose, UA: NEGATIVE
Ketones, UA: NEGATIVE
Nitrite, UA: POSITIVE — AB
Specific Gravity, UA: 1.025 (ref 1.005–1.030)
Urobilinogen, Ur: 1 mg/dL (ref 0.2–1.0)
pH, UA: 6.5 (ref 5.0–7.5)

## 2021-10-28 MED ORDER — MIRABEGRON ER 25 MG PO TB24: 25.0000 mg | ORAL_TABLET | Freq: Every day | ORAL | 0 refills | Status: DC

## 2021-10-28 NOTE — Patient Instructions (Signed)

## 2021-10-28 NOTE — Progress Notes (Signed)
Urological Symptom Review  Patient is experiencing the following symptoms: Frequent urination Burning/pain with urination Get up at night to urinate Leakage of urine Trouble starting stream Urinary tract infection Injury to kidneys/bladder Kidney stones   Review of Systems  Gastrointestinal (upper)  : Indigestion/heartburn  Gastrointestinal (lower) : Diarrhea Constipation  Constitutional : Negative for symptoms  Skin: Negative for skin symptoms  Eyes: Negative for eye symptoms  Ear/Nose/Throat : Negative for Ear/Nose/Throat symptoms  Hematologic/Lymphatic: Easy bruising  Cardiovascular : Leg swelling  Respiratory : Cough Shortness of breath  Endocrine: Negative for endocrine symptoms  Musculoskeletal: Back pain  Neurological: Headaches  Psychologic: Negative for psychiatric symptoms

## 2021-10-28 NOTE — Progress Notes (Signed)
10/28/2021 12:27 PM   Maria Campbell 11-12-1966 [id number]  Referring provider: Loman Brooklyn, [place],  [place] [address]  Followup nephrolithiasis   HPI: Maria Campbell is a [id number] here for followup for nephrolithiasis and with a new complaint today of urge incontinence. No stone events since last visit. No flank pain. NO recent abdominal imaging. Her new complaints today is urinary urgency and urge incontinence which has been present for over 1 year and is worsening. She has multiple episodes per day with urge incontinence. She uses 3-5 pads per day which are wet. She has associated nocturia 2-3x. Urine stream strong. No feeling of incomplete emptying. No issues with constipation.    PMH: Past Medical History:  Diagnosis Date   Barrett's esophagus    CKD (chronic kidney disease) stage 3, GFR 30-59 ml/min (HCC)    GERD (gastroesophageal reflux disease)    Hyperlipidemia    Kidney stones    Lung mass    PET negative on 05/22/18   Seizures (HCC)    Stroke (HCC)    Vitamin D deficiency     Surgical History: Past Surgical History:  Procedure Laterality Date   CYSTOSCOPY W/ URETERAL STENT PLACEMENT  05/05/2021   Procedure: CYSTOSCOPY WITH LEFT URETERAL STENT REPLACEMENT;  Surgeon: Cleon Gustin, MD;  Location: [facility];  Service: Urology;;   [place], URETEROSCOPY AND STENT PLACEMENT Left 05/05/2021   Procedure: CYSTOSCOPY WITH BILATERAL RETROGRADE PYELOGRAM, BILATERAL URETEROSCOPY,  RIGHT URETERAL [place];  Surgeon: Cleon Gustin, MD;  Location: [facility];  Service: Urology;  Laterality: Left;   HIP SURGERY Left    HOLMIUM LASER APPLICATION Left [id number]   Procedure: HOLMIUM LASER LITHOTRIPSY RIGHT URETERAL CALCULUS;  Surgeon: Cleon Gustin, MD;  Location: [facility];  Service: Urology;  Laterality: Left;   KNEE SURGERY Left    STONE EXTRACTION WITH BASKET  05/05/2021   Procedure: LEFT URETERAL STONE EXTRACTION WITH  BASKET;  Surgeon: Cleon Gustin, MD;  Location: [facility];  Service: Urology;;    Home Medications:  Allergies as of 10/28/2021       Reactions   Meperidine Hives, Nausea And Vomiting   Fainting and n/v (Demerol)   Other Hives   Dakin's [sodium Hypochlorite] Nausea And Vomiting   Bleach        Medication List        Accurate as of October 28, 2021 12:27 PM. If you have any questions, ask your nurse or doctor.          albuterol 108 (90 Base) MCG/ACT inhaler Commonly known as: VENTOLIN HFA Inhale 2 puffs into the lungs every 6 (six) hours as needed.   aspirin 81 MG EC tablet Take 81 mg by mouth daily.   atorvastatin 20 MG tablet Commonly known as: LIPITOR Take 1 tablet (20 mg total) by mouth daily.   dapagliflozin propanediol 10 MG Tabs tablet Commonly known as: Farxiga Take 1 tablet (10 mg total) by mouth daily before breakfast. What changed: how much to take   gabapentin 300 MG capsule Commonly known as: NEURONTIN TAKE 1 CAPSULE(300 MG) BY MOUTH DAILY What changed:  how much to take how to take this when to take this additional instructions   lamoTRIgine 200 MG tablet Commonly known as: LAMICTAL TAKE 1 TABLET ONCE DAILY.   nortriptyline 50 MG capsule Commonly known as: PAMELOR TAKE 2 CAPSULES NIGHTLY. What changed:  how much to take how to take this when to  take this additional instructions   omeprazole 40 MG capsule Commonly known as: PRILOSEC Take 1 capsule (40 mg total) by mouth daily.        Allergies:  Allergies  Allergen Reactions   Meperidine Hives and Nausea And Vomiting    Fainting and n/v (Demerol)    Other Hives   Dakin's [Sodium Hypochlorite] Nausea And Vomiting    Bleach    Family History: Family History  Problem Relation Age of Onset   Heart disease Mother    Heart attack Mother    Heart disease Father    Heart failure Father    Heart disease Maternal Grandmother    Heart disease Maternal Grandfather     Diabetes Paternal Grandmother     Social History:  reports that she has been smoking cigarettes. She has a 35.00 pack-year smoking history. She has never used smokeless tobacco. She reports that she does not drink alcohol and does not use drugs.  ROS: All other review of systems were reviewed and are negative except what is noted above in HPI  Physical Exam: BP 109/68   Pulse (!) 101   Constitutional:  Alert and oriented, No acute distress. HEENT: [place] AT, moist mucus membranes.  Trachea midline, no masses. Cardiovascular: No clubbing, cyanosis, or edema. Respiratory: Normal respiratory effort, no increased work of breathing. GI: Abdomen is soft, nontender, nondistended, no abdominal masses GU: No CVA tenderness.  Lymph: No cervical or inguinal lymphadenopathy. Skin: No rashes, bruises or suspicious lesions. Neurologic: Grossly intact, no focal deficits, moving all 4 extremities. Psychiatric: Normal mood and affect.  Laboratory Data: Lab Results  Component Value Date   WBC 6.1 09/14/2021   HGB 11.9 09/14/2021   HCT 38.5 09/14/2021   MCV 77 (L) 09/14/2021   PLT 238 09/14/2021    Lab Results  Component Value Date   CREATININE 1.50 (H) 09/14/2021    No results found for: PSA  No results found for: TESTOSTERONE  No results found for: HGBA1C  Urinalysis    Component Value Date/Time   APPEARANCEUR Clear 04/04/2021 1512   GLUCOSEU Negative 04/04/2021 1512   BILIRUBINUR Negative 04/04/2021 1512   PROTEINUR 2+ (A) 04/04/2021 1512   NITRITE Positive (A) 04/04/2021 1512   LEUKOCYTESUR 3+ (A) 04/04/2021 1512    Lab Results  Component Value Date   LABMICR Comment 04/04/2021   WBCUA None seen 08/10/2020   LABEPIT 0-10 08/10/2020   BACTERIA None seen 08/10/2020    Pertinent Imaging:  Results for orders placed during the hospital encounter of 04/04/21  DG Abd 1 View  Narrative CLINICAL DATA:  Nephrolithiasis, ureteral stent  EXAM: ABDOMEN - 1 VIEW  COMPARISON:   CT abdomen pelvis 11/23/2020  FINDINGS: Left ureteral stent is seen. 8 mm right renal calculus is seen. Left total hip prosthesis partially visualized. Degenerative changes seen in the lower lumbar spine.  IMPRESSION: 1. Left ureteral stent. 2. 8 mm right renal calculus again seen.   Electronically Signed By: Miachel Roux M.D. On: 04/06/2021 09:47  No results found for this or any previous visit.  No results found for this or any previous visit.  No results found for this or any previous visit.  Results for orders placed during the hospital encounter of 07/08/21  Ultrasound renal complete  Narrative CLINICAL DATA:  Nephrolithiasis.  Bilateral lithotripsy 05/05/2021.  EXAM: RENAL / URINARY TRACT ULTRASOUND COMPLETE  COMPARISON:  Radiograph 04/04/2021.  CT 11/23/2020  FINDINGS: Right Kidney:  Renal measurements: 9.7 x 5.0 x 5.3  cm = volume: 135 mL. Lobulated contour consistent with scarring. No hydronephrosis. Lower pole calcification measures approximately 7 mm. 1.7 cm cyst in the mid kidney. No obvious solid lesion.  Left Kidney:  Renal measurements: 11.6 x 5.3 x 4.4 cm = volume: 141 mL. Lobulated contour consistent with scarring. No hydronephrosis. Robynn Pane 2 shadowing calcifications in the left kidney, larger measuring 7 mm in the interpolar region.  Bladder:  Nondistended and not well evaluated.  Other:  Technically challenging exam due to habitus.  IMPRESSION: 1. No hydronephrosis. 2. Bilateral intrarenal calculi, 7 mm stone on the right and 2 stones on the left, largest 7 mm. 3. Right renal cyst. 4. Urinary bladder nondistended and not well evaluated.   Electronically Signed By: Narda Rutherford M.D. On: 07/09/2021 16:47  No results found for this or any previous visit.  No results found for this or any previous visit.  No results found for this or any previous visit.   Assessment & Plan:    1. Nephrolithiasis -KUB and renal US, will  call with result - Urinalysis, Routine w reflex microscopic  2. OAB Mirabegron 25mg  daily - Urinalysis, Routine w reflex microscopic   No follow-ups on file.  , MD  [facility] [place]

## 2021-11-01 ENCOUNTER — Other Ambulatory Visit: Payer: Self-pay | Admitting: Physician Assistant

## 2021-11-01 ENCOUNTER — Telehealth: Payer: Self-pay

## 2021-11-01 ENCOUNTER — Other Ambulatory Visit: Payer: Self-pay

## 2021-11-01 LAB — URINE CULTURE

## 2021-11-01 MED ORDER — NITROFURANTOIN MONOHYD MACRO 100 MG PO CAPS: 100.0000 mg | ORAL_CAPSULE | Freq: Two times a day (BID) | ORAL | 0 refills | Status: DC

## 2021-11-01 MED ORDER — NITROFURANTOIN MONOHYD MACRO 100 MG PO CAPS: 100.0000 mg | ORAL_CAPSULE | Freq: Two times a day (BID) | ORAL | 0 refills | Status: AC

## 2021-11-01 NOTE — Progress Notes (Signed)
Urine cx indicates need for tx. Macrobid sent to pharmacy.

## 2021-11-01 NOTE — Telephone Encounter (Signed)
Patient called and made aware. Requested medication to be sent to [facility].  Rx resent to requested pharmacy. [facility] removed per patient's request.

## 2021-11-01 NOTE — Telephone Encounter (Signed)
-----   Message from Sydnee Levans, [place] sent at 11/01/2021 10:54 AM EST ----- Please let pt know urine cx indicates infection and Rx for Macrobid has been sent to her pharmacy. ----- Message ----- From: Gustavus Messing, LPN Sent: [id number]   8:27 AM EST To: Regan Rakers [place], PA-C  No treatment started

## 2021-11-02 ENCOUNTER — Other Ambulatory Visit: Payer: Self-pay | Admitting: Family Medicine

## 2021-11-02 DIAGNOSIS — K219 Gastro-esophageal reflux disease without esophagitis: Secondary | ICD-10-CM

## 2021-11-02 DIAGNOSIS — Z87898 Personal history of other specified conditions: Secondary | ICD-10-CM

## 2021-11-02 DIAGNOSIS — G43009 Migraine without aura, not intractable, without status migrainosus: Secondary | ICD-10-CM

## 2021-11-07 ENCOUNTER — Other Ambulatory Visit: Payer: Self-pay

## 2021-11-07 ENCOUNTER — Ambulatory Visit (HOSPITAL_COMMUNITY)
Admission: RE | Admit: 2021-11-07 | Discharge: 2021-11-07 | Disposition: A | Discharge status: Home / Self Care | Payer: Medicare HMO | Source: Ambulatory Visit | Attending: Urology | Admitting: Urology

## 2021-11-07 DIAGNOSIS — N200 Calculus of kidney: Secondary | ICD-10-CM | POA: Diagnosis not present

## 2021-11-22 ENCOUNTER — Encounter (INDEPENDENT_AMBULATORY_CARE_PROVIDER_SITE_OTHER): Payer: Self-pay | Admitting: *Deleted

## 2021-11-25 ENCOUNTER — Institutional Professional Consult (permissible substitution): Payer: Medicare HMO | Admitting: Pulmonary Disease

## 2021-11-28 ENCOUNTER — Ambulatory Visit: Payer: Medicare HMO | Admitting: Urology

## 2021-12-02 ENCOUNTER — Other Ambulatory Visit: Payer: Self-pay | Admitting: Family Medicine

## 2021-12-02 DIAGNOSIS — E782 Mixed hyperlipidemia: Secondary | ICD-10-CM

## 2021-12-14 ENCOUNTER — Telehealth: Payer: Self-pay | Admitting: Family Medicine

## 2021-12-14 DIAGNOSIS — N1832 Chronic kidney disease, stage 3b: Secondary | ICD-10-CM

## 2021-12-14 DIAGNOSIS — R0602 Shortness of breath: Secondary | ICD-10-CM

## 2021-12-14 MED ORDER — ALBUTEROL SULFATE HFA 108 (90 BASE) MCG/ACT IN AERS: 2.0000 | INHALATION_SPRAY | Freq: Four times a day (QID) | RESPIRATORY_TRACT | 0 refills | Status: DC | PRN

## 2021-12-14 MED ORDER — DAPAGLIFLOZIN PROPANEDIOL 10 MG PO TABS: 10.0000 mg | ORAL_TABLET | Freq: Every day | ORAL | 0 refills | Status: DC

## 2021-12-14 NOTE — Telephone Encounter (Signed)
°  Prescription Request  12/14/2021  Is this a "Controlled Substance" medicine? no  Have you seen your PCP in the last 2 weeks? no  If YES, route message to pool  -  If NO, patient needs to be scheduled for appointment.  What is the name of the medication or equipment? Dapagliflozin propanediol 10 mg, Albuterol 108 90 Base   Have you contacted your pharmacy to request a refill? NO   Which pharmacy would you like this sent to? [facility]   Patient notified that their request is being sent to the clinical staff for review and that they should receive a response within 2 business days.

## 2021-12-14 NOTE — Telephone Encounter (Signed)
Pt aware refill refills sent to pharmacy

## 2021-12-26 ENCOUNTER — Ambulatory Visit (INDEPENDENT_AMBULATORY_CARE_PROVIDER_SITE_OTHER): Payer: Medicare HMO | Admitting: Gastroenterology

## 2021-12-28 ENCOUNTER — Ambulatory Visit: Payer: Medicare HMO | Admitting: Urology

## 2022-01-03 ENCOUNTER — Encounter (INDEPENDENT_AMBULATORY_CARE_PROVIDER_SITE_OTHER): Payer: Self-pay | Admitting: Gastroenterology

## 2022-01-03 ENCOUNTER — Ambulatory Visit: Payer: Medicare HMO | Admitting: Family Medicine

## 2022-01-03 ENCOUNTER — Ambulatory Visit (INDEPENDENT_AMBULATORY_CARE_PROVIDER_SITE_OTHER): Payer: Medicare HMO | Admitting: Gastroenterology

## 2022-01-03 ENCOUNTER — Other Ambulatory Visit: Payer: Self-pay | Admitting: Family Medicine

## 2022-01-03 DIAGNOSIS — Z87898 Personal history of other specified conditions: Secondary | ICD-10-CM

## 2022-01-03 DIAGNOSIS — N1832 Chronic kidney disease, stage 3b: Secondary | ICD-10-CM

## 2022-01-03 DIAGNOSIS — G43009 Migraine without aura, not intractable, without status migrainosus: Secondary | ICD-10-CM

## 2022-01-04 ENCOUNTER — Encounter: Payer: Self-pay | Admitting: Family Medicine

## 2022-01-04 ENCOUNTER — Ambulatory Visit (INDEPENDENT_AMBULATORY_CARE_PROVIDER_SITE_OTHER): Payer: Medicare HMO | Admitting: Family Medicine

## 2022-01-04 VITALS — BP 105/69 | HR 88 | Temp 97.1°F | Ht 62.0 in | Wt 225.2 lb

## 2022-01-04 DIAGNOSIS — K219 Gastro-esophageal reflux disease without esophagitis: Secondary | ICD-10-CM | POA: Diagnosis not present

## 2022-01-04 DIAGNOSIS — N1832 Chronic kidney disease, stage 3b: Secondary | ICD-10-CM | POA: Diagnosis not present

## 2022-01-04 DIAGNOSIS — F411 Generalized anxiety disorder: Secondary | ICD-10-CM | POA: Diagnosis not present

## 2022-01-04 DIAGNOSIS — G43009 Migraine without aura, not intractable, without status migrainosus: Secondary | ICD-10-CM

## 2022-01-04 DIAGNOSIS — Z114 Encounter for screening for human immunodeficiency virus [HIV]: Secondary | ICD-10-CM | POA: Diagnosis not present

## 2022-01-04 DIAGNOSIS — E782 Mixed hyperlipidemia: Secondary | ICD-10-CM | POA: Diagnosis not present

## 2022-01-04 DIAGNOSIS — R202 Paresthesia of skin: Secondary | ICD-10-CM

## 2022-01-04 DIAGNOSIS — R0602 Shortness of breath: Secondary | ICD-10-CM

## 2022-01-04 DIAGNOSIS — R69 Illness, unspecified: Secondary | ICD-10-CM | POA: Diagnosis not present

## 2022-01-04 DIAGNOSIS — Z8673 Personal history of transient ischemic attack (TIA), and cerebral infarction without residual deficits: Secondary | ICD-10-CM

## 2022-01-04 DIAGNOSIS — Z87898 Personal history of other specified conditions: Secondary | ICD-10-CM | POA: Diagnosis not present

## 2022-01-04 DIAGNOSIS — R200 Anesthesia of skin: Secondary | ICD-10-CM

## 2022-01-04 DIAGNOSIS — Z1159 Encounter for screening for other viral diseases: Secondary | ICD-10-CM | POA: Diagnosis not present

## 2022-01-04 DIAGNOSIS — E559 Vitamin D deficiency, unspecified: Secondary | ICD-10-CM

## 2022-01-04 DIAGNOSIS — Z23 Encounter for immunization: Secondary | ICD-10-CM

## 2022-01-04 DIAGNOSIS — F3341 Major depressive disorder, recurrent, in partial remission: Secondary | ICD-10-CM

## 2022-01-04 MED ORDER — OMEPRAZOLE 40 MG PO CPDR: 40.0000 mg | DELAYED_RELEASE_CAPSULE | Freq: Every day | ORAL | 1 refills | Status: DC

## 2022-01-04 MED ORDER — LAMOTRIGINE 200 MG PO TABS: 200.0000 mg | ORAL_TABLET | Freq: Every day | ORAL | 1 refills | Status: DC
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
## 2022-01-04 NOTE — Patient Instructions (Addendum)
Please reschedule your appointment with the gastroenterologist.   Do not use your Albuterol inhaler for 8 hours before your lung test.

## 2022-01-05 LAB — CBC WITH DIFFERENTIAL/PLATELET
Basophils Absolute: 0 10*3/uL (ref 0.0–0.2)
Basos: 1 %
EOS (ABSOLUTE): 0.2 10*3/uL (ref 0.0–0.4)
Eos: 3 %
Hematocrit: 37.6 % (ref 34.0–46.6)
Hemoglobin: 11.4 g/dL (ref 11.1–15.9)
Immature Grans (Abs): 0 10*3/uL (ref 0.0–0.1)
Immature Granulocytes: 0 %
Lymphocytes Absolute: 1.7 10*3/uL (ref 0.7–3.1)
Lymphs: 31 %
MCH: 22.8 pg — ABNORMAL LOW (ref 26.6–33.0)
MCHC: 30.3 g/dL — ABNORMAL LOW (ref 31.5–35.7)
MCV: 75 fL — ABNORMAL LOW (ref 79–97)
Monocytes Absolute: 0.3 10*3/uL (ref 0.1–0.9)
Monocytes: 5 %
Neutrophils Absolute: 3.4 10*3/uL (ref 1.4–7.0)
Neutrophils: 60 %
Platelets: 210 10*3/uL (ref 150–450)
RBC: 5 x10E6/uL (ref 3.77–5.28)
RDW: 17.3 % — ABNORMAL HIGH (ref 11.7–15.4)
WBC: 5.6 10*3/uL (ref 3.4–10.8)

## 2022-01-05 LAB — CMP14+EGFR
ALT: 7 IU/L (ref 0–32)
AST: 10 IU/L (ref 0–40)
Albumin/Globulin Ratio: 1.7 (ref 1.2–2.2)
Albumin: 4.1 g/dL (ref 3.8–4.9)
Alkaline Phosphatase: 132 IU/L — ABNORMAL HIGH (ref 44–121)
BUN/Creatinine Ratio: 7 — ABNORMAL LOW (ref 9–23)
BUN: 9 mg/dL (ref 6–24)
Bilirubin Total: 0.2 mg/dL (ref 0.0–1.2)
CO2: 20 mmol/L (ref 20–29)
Calcium: 9.2 mg/dL (ref 8.7–10.2)
Chloride: 103 mmol/L (ref 96–106)
Creatinine, Ser: 1.3 mg/dL — ABNORMAL HIGH (ref 0.57–1.00)
Globulin, Total: 2.4 g/dL (ref 1.5–4.5)
Glucose: 89 mg/dL (ref 70–99)
Potassium: 3.8 mmol/L (ref 3.5–5.2)
Sodium: 139 mmol/L (ref 134–144)
Total Protein: 6.5 g/dL (ref 6.0–8.5)
eGFR: 48 mL/min/{1.73_m2} — ABNORMAL LOW (ref >59)

## 2022-01-05 LAB — HIV ANTIBODY (ROUTINE TESTING W REFLEX): HIV Screen 4th Generation wRfx: NONREACTIVE

## 2022-01-05 LAB — LIPID PANEL
Chol/HDL Ratio: 3.9 ratio (ref 0.0–4.4)
Cholesterol, Total: 156 mg/dL (ref 100–199)
HDL: 40 mg/dL (ref >39)
LDL Chol Calc (NIH): 96 mg/dL (ref 0–99)
Triglycerides: 108 mg/dL (ref 0–149)
VLDL Cholesterol Cal: 20 mg/dL (ref 5–40)

## 2022-01-05 LAB — VITAMIN D 25 HYDROXY (VIT D DEFICIENCY, FRACTURES): Vit D, 25-Hydroxy: 25.7 ng/mL — ABNORMAL LOW (ref 30.0–100.0)

## 2022-01-05 LAB — HCV AB W REFLEX TO QUANT PCR: HCV Ab: NONREACTIVE

## 2022-01-05 LAB — HCV INTERPRETATION

## 2022-01-09 ENCOUNTER — Encounter: Payer: Self-pay | Admitting: Family Medicine

## 2022-01-12 ENCOUNTER — Other Ambulatory Visit: Payer: Self-pay | Admitting: Family Medicine

## 2022-01-12 DIAGNOSIS — Z1231 Encounter for screening mammogram for malignant neoplasm of breast: Secondary | ICD-10-CM

## 2022-01-23 ENCOUNTER — Institutional Professional Consult (permissible substitution): Payer: Medicare HMO | Admitting: Pulmonary Disease

## 2022-01-31 ENCOUNTER — Other Ambulatory Visit: Payer: Self-pay | Admitting: Family Medicine

## 2022-01-31 DIAGNOSIS — N1832 Chronic kidney disease, stage 3b: Secondary | ICD-10-CM

## 2022-01-31 DIAGNOSIS — G43009 Migraine without aura, not intractable, without status migrainosus: Secondary | ICD-10-CM

## 2022-01-31 DIAGNOSIS — Z87898 Personal history of other specified conditions: Secondary | ICD-10-CM

## 2022-02-21 ENCOUNTER — Other Ambulatory Visit: Payer: Self-pay | Admitting: Family Medicine

## 2022-02-21 DIAGNOSIS — E782 Mixed hyperlipidemia: Secondary | ICD-10-CM

## 2022-02-23 ENCOUNTER — Other Ambulatory Visit: Payer: Self-pay | Admitting: Family Medicine

## 2022-02-23 DIAGNOSIS — Z87898 Personal history of other specified conditions: Secondary | ICD-10-CM

## 2022-02-23 DIAGNOSIS — G43009 Migraine without aura, not intractable, without status migrainosus: Secondary | ICD-10-CM

## 2022-03-01 ENCOUNTER — Telehealth: Payer: Self-pay | Admitting: *Deleted

## 2022-03-01 DIAGNOSIS — E782 Mixed hyperlipidemia: Secondary | ICD-10-CM

## 2022-03-01 DIAGNOSIS — N1832 Chronic kidney disease, stage 3b: Secondary | ICD-10-CM

## 2022-03-01 MED ORDER — DAPAGLIFLOZIN PROPANEDIOL 10 MG PO TABS: 10.0000 mg | ORAL_TABLET | Freq: Every day | ORAL | 0 refills | Status: DC

## 2022-03-01 MED ORDER — ATORVASTATIN CALCIUM 20 MG PO TABS: 20.0000 mg | ORAL_TABLET | Freq: Every day | ORAL | 0 refills | Status: DC

## 2022-03-01 NOTE — Telephone Encounter (Signed)
TC from rep w/ [employer] ?Pt wanting 100 d supply of Atorvastatin & Farxiga ?Can send in a 90d supply at this time, providers are discussing going to the 100d supply ?

## 2022-04-18 ENCOUNTER — Ambulatory Visit (INDEPENDENT_AMBULATORY_CARE_PROVIDER_SITE_OTHER): Payer: Medicare HMO

## 2022-04-18 ENCOUNTER — Ambulatory Visit
Admission: RE | Admit: 2022-04-18 | Discharge: 2022-04-18 | Disposition: A | Discharge status: Home / Self Care | Payer: Medicare HMO | Source: Ambulatory Visit | Attending: Family Medicine | Admitting: Family Medicine

## 2022-04-18 ENCOUNTER — Ambulatory Visit (INDEPENDENT_AMBULATORY_CARE_PROVIDER_SITE_OTHER): Payer: Medicare HMO | Admitting: Family Medicine

## 2022-04-18 ENCOUNTER — Encounter: Payer: Self-pay | Admitting: Family Medicine

## 2022-04-18 VITALS — BP 101/73 | HR 88 | Temp 97.0°F | Ht 62.0 in | Wt 226.0 lb

## 2022-04-18 DIAGNOSIS — M5441 Lumbago with sciatica, right side: Secondary | ICD-10-CM

## 2022-04-18 DIAGNOSIS — Z1231 Encounter for screening mammogram for malignant neoplasm of breast: Secondary | ICD-10-CM

## 2022-04-18 DIAGNOSIS — R296 Repeated falls: Secondary | ICD-10-CM | POA: Diagnosis not present

## 2022-04-18 DIAGNOSIS — M5442 Lumbago with sciatica, left side: Secondary | ICD-10-CM

## 2022-04-18 DIAGNOSIS — M5136 Other intervertebral disc degeneration, lumbar region: Secondary | ICD-10-CM | POA: Diagnosis not present

## 2022-04-18 DIAGNOSIS — M4316 Spondylolisthesis, lumbar region: Secondary | ICD-10-CM | POA: Diagnosis not present

## 2022-04-18 DIAGNOSIS — G8929 Other chronic pain: Secondary | ICD-10-CM

## 2022-04-18 DIAGNOSIS — M79642 Pain in left hand: Secondary | ICD-10-CM | POA: Diagnosis not present

## 2022-04-18 DIAGNOSIS — N1832 Chronic kidney disease, stage 3b: Secondary | ICD-10-CM

## 2022-04-18 DIAGNOSIS — E782 Mixed hyperlipidemia: Secondary | ICD-10-CM

## 2022-04-18 DIAGNOSIS — M545 Low back pain, unspecified: Secondary | ICD-10-CM | POA: Diagnosis not present

## 2022-04-18 DIAGNOSIS — R739 Hyperglycemia, unspecified: Secondary | ICD-10-CM | POA: Diagnosis not present

## 2022-04-18 DIAGNOSIS — R0602 Shortness of breath: Secondary | ICD-10-CM

## 2022-04-18 DIAGNOSIS — M19042 Primary osteoarthritis, left hand: Secondary | ICD-10-CM | POA: Diagnosis not present

## 2022-04-18 LAB — BAYER DCA HB A1C WAIVED: HB A1C (BAYER DCA - WAIVED): 5.1 % (ref 4.8–5.6)

## 2022-04-18 MED ORDER — ALBUTEROL SULFATE HFA 108 (90 BASE) MCG/ACT IN AERS
2.0000 | INHALATION_SPRAY | Freq: Four times a day (QID) | RESPIRATORY_TRACT | 0 refills | Status: DC | PRN
Start: 2022-04-18 — End: 2022-10-31

## 2022-04-18 MED ORDER — UMECLIDINIUM-VILANTEROL 62.5-25 MCG/ACT IN AEPB: 1.0000 | INHALATION_SPRAY | Freq: Every day | RESPIRATORY_TRACT | 0 refills | Status: DC

## 2022-04-18 NOTE — Addendum Note (Signed)
Addended by: Gwenlyn Fudge on: 04/18/2022 04:58 PM   Modules accepted: Orders

## 2022-04-18 NOTE — Progress Notes (Signed)
Assessment & Plan:  1. Exertional shortness of breath PFTs to be scheduled as they were ordered in February.  Education provided on PFTs.  Patient instructed not to use albuterol 8 hours prior to and Anoro 12 hours prior to the PFT. - umeclidinium-vilanterol (ANORO ELLIPTA) 62.5-25 MCG/ACT AEPB; Inhale 1 puff into the lungs daily at 6 (six) AM.  Dispense: 60 each; Refill: 0 - albuterol (VENTOLIN HFA) 108 (90 Base) MCG/ACT inhaler; Inhale 2 puffs into the lungs every 6 (six) hours as needed.  Dispense: 18 g; Refill: 0  2. Chronic midline low back pain with bilateral sciatica Uncontrolled.  Advised to take Tylenol for pain and avoid all NSAIDs. - DG Lumbar Spine 2-3 Views - Ambulatory referral to Physical Therapy  3. Left hand pain - DG Hand Complete Left  4. Multiple falls - Ambulatory referral to Physical Therapy  5. Stage 3b chronic kidney disease ([place]) Continue Farxiga daily.  Avoid all NSAIDs. - CBC with Differential/Platelet - CMP14+EGFR - Lipid panel - Bayer DCA Hb A1c Waived  6. Mixed hyperlipidemia - CBC with Differential/Platelet - CMP14+EGFR - Lipid panel - Bayer DCA Hb A1c Waived   Follow up plan: Return in about 2 weeks (around 05/02/2022) for Breathing & 3 months CPE.  Hendricks Limes, MSN, APRN, FNP-C [facility]  Subjective:   Patient ID: Maria Campbell, female    DOB: September 15, 1966, 56 y.o.   MRN: [id number]  HPI: Maria Campbell is a 56 y.o. female presenting on 04/18/2022 for Medical Management of Chronic Issues and Shortness of Breath (With exertion)  Patient reports severe shortness of breath with exertion. She was previously prescribed Albuterol which is VERY helpful; she is needing it multiple times per day. PFTs were ordered in February but patient states she never heard anything about this after her appointment. She had an appointment with pulmonology in March for a sleep study, but cancelled this as she was sick.   Patient also  reports she has fallen multiple times over the past month as she feels her legs are giving out on her. Legs feel like spaghetti. She is also having low back pain. Pain is described as shooting and occurs midline; the pain does go down both legs. She rates the pain 8/10 on average. Pain is worsened by walking prolonged sitting. Pain improves with resting and position changes. She has taken Aleve for the pain, which she was unaware she shouldn't be taking due to her CKD. She hurt her left hand with her last fall and has not seen anyone about it.   ROS: Negative unless specifically indicated above in HPI.   Relevant past medical history reviewed and updated as indicated.   Allergies and medications reviewed and updated.   Current Outpatient Medications:    aspirin 81 MG EC tablet, Take 81 mg by mouth daily., Disp: , Rfl:    atorvastatin (LIPITOR) 20 MG tablet, Take 1 tablet (20 mg total) by mouth daily., Disp: 90 tablet, Rfl: 0   dapagliflozin propanediol (FARXIGA) 10 MG TABS tablet, Take 1 tablet (10 mg total) by mouth daily before breakfast., Disp: 90 tablet, Rfl: 0   gabapentin (NEURONTIN) 300 MG capsule, TAKE 1 CAPSULE DAILY., Disp: 30 capsule, Rfl: 1   lamoTRIgine (LAMICTAL) 200 MG tablet, Take 1 tablet (200 mg total) by mouth daily., Disp: 90 tablet, Rfl: 1   mirabegron ER (MYRBETRIQ) 25 MG TB24 tablet, Take 1 tablet (25 mg total) by mouth daily., Disp: 30 tablet, Rfl: 0  nortriptyline (PAMELOR) 50 MG capsule, TAKE 2 CAPSULES IN THE EVENING., Disp: 60 capsule, Rfl: 1   omeprazole (PRILOSEC) 40 MG capsule, Take 1 capsule (40 mg total) by mouth daily., Disp: 90 capsule, Rfl: 1   umeclidinium-vilanterol (ANORO ELLIPTA) 62.5-25 MCG/ACT AEPB, Inhale 1 puff into the lungs daily at 6 (six) AM., Disp: 60 each, Rfl: 0   albuterol (VENTOLIN HFA) 108 (90 Base) MCG/ACT inhaler, Inhale 2 puffs into the lungs every 6 (six) hours as needed., Disp: 18 g, Rfl: 0  Allergies  Allergen Reactions   Meperidine  Hives and Nausea And Vomiting    Fainting and n/v (Demerol)    Other Hives   Dakin's [Sodium Hypochlorite] Nausea And Vomiting    Bleach    Objective:   BP 101/73   Pulse 88   Temp (!) 97 F (36.1 C)   Ht 5' 2"  (1.575 m)   Wt 226 lb (102.5 kg)   SpO2 98%   BMI 41.34 kg/m    Physical Exam Vitals reviewed.  Constitutional:      General: She is not in acute distress.    Appearance: Normal appearance. She is morbidly obese. She is not ill-appearing, toxic-appearing or diaphoretic.  HENT:     Head: Normocephalic and atraumatic.  Eyes:     General: No scleral icterus.       Right eye: No discharge.        Left eye: No discharge.     Conjunctiva/sclera: Conjunctivae normal.  Cardiovascular:     Rate and Rhythm: Normal rate and regular rhythm.     Heart sounds: Normal heart sounds. No murmur heard.   No friction rub. No gallop.  Pulmonary:     Effort: Pulmonary effort is normal. No respiratory distress.     Breath sounds: Normal breath sounds. No stridor. No wheezing, rhonchi or rales.  Musculoskeletal:     Left hand: Swelling and bony tenderness present. Decreased range of motion. Normal capillary refill. Normal pulse.     Cervical back: Normal range of motion.     Lumbar back: Bony tenderness present.  Skin:    General: Skin is warm and dry.     Capillary Refill: Capillary refill takes less than 2 seconds.  Neurological:     General: No focal deficit present.     Mental Status: She is alert and oriented to person, place, and time. Mental status is at baseline.  Psychiatric:        Mood and Affect: Mood normal.        Behavior: Behavior normal.        Thought Content: Thought content normal.        Judgment: Judgment normal.

## 2022-04-18 NOTE — Patient Instructions (Addendum)
Reschedule your appointment with [facility] at [facility] [address], [address] [place] [address] [phone]  Avoid Ibuprofen, Advil, Aleve, Motrin, Goody Powders, Naproxen, BC powders, Meloxicam, Diclofenac, Indomethacin and other nonsteroidal anti-inflammatory medications (NSAIDs).  Pulmonary Function Test (PFT)  What is pulmonary function testing and why is it done?  Pulmonary function tests (also called PFTs or lung function tests) help determine how well your  lungs are functioning. The results of these tests tell me how much air your lungs can  hold, how quickly you can move air into and out of your lungs, and how well your lungs are able  to use oxygen and get rid of carbon dioxide. The tests help me determine if you  have a lung disease (such as COPD), help provide a measure of how significant your lung disease is, and can  show how well the treatment for your lung disease is working.   How is a pulmonary function test done? Pulmonary function testing is usually done by a specially trained respiratory therapist or  technician. For most pulmonary function tests, you will be asked to wear a nose clip to make  sure that no air passes through your nose during the test. You will be asked to breathe into a  mouthpiece that is connected to a machine called a spirometer. The technician may encourage  you to breathe deeply during parts of the test to get the best results. Following all of the  technician's instructions will help provide the most accurate results.  How do I prepare for my test? ? You should not eat a heavy meal just before this test. ? You should not smoke for six hours before the test. ? You should not exercise vigorously for six hours before the test. ? On the day of the test, avoid food or drinks that have caffeine. ? On the day of the test, wear loose clothing that does not restrict your breathing in any way. ? If you have dentures, wear them during  the test.  Should I take my medications on the day of the test? You can take everything but your inhalers. If you take Albuterol, Combivent or Atrovent you  should stop taking them eight hours before the test. If you are on a maintenance inhaler, stop taking it 12  hours before test. Antihistamines should not be taken 48 hours before the test. Stop taking  Serevent 12 hours before the test.

## 2022-04-19 LAB — CBC WITH DIFFERENTIAL/PLATELET
Basophils Absolute: 0 10*3/uL (ref 0.0–0.2)
Basos: 1 %
EOS (ABSOLUTE): 0.1 10*3/uL (ref 0.0–0.4)
Eos: 2 %
Hematocrit: 37.5 % (ref 34.0–46.6)
Hemoglobin: 11.6 g/dL (ref 11.1–15.9)
Immature Grans (Abs): 0 10*3/uL (ref 0.0–0.1)
Immature Granulocytes: 0 %
Lymphocytes Absolute: 1.3 10*3/uL (ref 0.7–3.1)
Lymphs: 28 %
MCH: 23.5 pg — ABNORMAL LOW (ref 26.6–33.0)
MCHC: 30.9 g/dL — ABNORMAL LOW (ref 31.5–35.7)
MCV: 76 fL — ABNORMAL LOW (ref 79–97)
Monocytes Absolute: 0.3 10*3/uL (ref 0.1–0.9)
Monocytes: 7 %
Neutrophils Absolute: 3 10*3/uL (ref 1.4–7.0)
Neutrophils: 62 %
Platelets: 174 10*3/uL (ref 150–450)
RBC: 4.94 x10E6/uL (ref 3.77–5.28)
RDW: 19.5 % — ABNORMAL HIGH (ref 11.7–15.4)
WBC: 4.8 10*3/uL (ref 3.4–10.8)

## 2022-04-19 LAB — CMP14+EGFR
ALT: 9 IU/L (ref 0–32)
AST: 12 IU/L (ref 0–40)
Albumin/Globulin Ratio: 1.4 (ref 1.2–2.2)
Albumin: 3.9 g/dL (ref 3.8–4.9)
Alkaline Phosphatase: 122 IU/L — ABNORMAL HIGH (ref 44–121)
BUN/Creatinine Ratio: 5 — ABNORMAL LOW (ref 9–23)
BUN: 7 mg/dL (ref 6–24)
Bilirubin Total: 0.2 mg/dL (ref 0.0–1.2)
CO2: 22 mmol/L (ref 20–29)
Calcium: 9.6 mg/dL (ref 8.7–10.2)
Chloride: 102 mmol/L (ref 96–106)
Creatinine, Ser: 1.35 mg/dL — ABNORMAL HIGH (ref 0.57–1.00)
Globulin, Total: 2.7 g/dL (ref 1.5–4.5)
Glucose: 92 mg/dL (ref 70–99)
Potassium: 4.3 mmol/L (ref 3.5–5.2)
Sodium: 137 mmol/L (ref 134–144)
Total Protein: 6.6 g/dL (ref 6.0–8.5)
eGFR: 46 mL/min/{1.73_m2} — ABNORMAL LOW (ref >59)

## 2022-04-19 LAB — LIPID PANEL
Chol/HDL Ratio: 3.7 ratio (ref 0.0–4.4)
Cholesterol, Total: 169 mg/dL (ref 100–199)
HDL: 46 mg/dL (ref >39)
LDL Chol Calc (NIH): 99 mg/dL (ref 0–99)
Triglycerides: 138 mg/dL (ref 0–149)
VLDL Cholesterol Cal: 24 mg/dL (ref 5–40)

## 2022-04-21 ENCOUNTER — Other Ambulatory Visit: Payer: Self-pay | Admitting: Family Medicine

## 2022-04-21 DIAGNOSIS — R928 Other abnormal and inconclusive findings on diagnostic imaging of breast: Secondary | ICD-10-CM

## 2022-04-25 ENCOUNTER — Ambulatory Visit: Payer: Medicare HMO | Attending: Family Medicine

## 2022-04-25 DIAGNOSIS — M5441 Lumbago with sciatica, right side: Secondary | ICD-10-CM | POA: Insufficient documentation

## 2022-04-25 DIAGNOSIS — Z9181 History of falling: Secondary | ICD-10-CM | POA: Insufficient documentation

## 2022-04-25 DIAGNOSIS — R296 Repeated falls: Secondary | ICD-10-CM | POA: Insufficient documentation

## 2022-04-25 DIAGNOSIS — M5459 Other low back pain: Secondary | ICD-10-CM | POA: Insufficient documentation

## 2022-04-25 DIAGNOSIS — G8929 Other chronic pain: Secondary | ICD-10-CM | POA: Insufficient documentation

## 2022-04-25 DIAGNOSIS — M5442 Lumbago with sciatica, left side: Secondary | ICD-10-CM | POA: Diagnosis not present

## 2022-04-25 NOTE — Therapy (Signed)
[facility] [place], [place], [address] Phone: [phone]   Fax:  [phone]  Physical Therapy Evaluation  Patient Details  Name: Maria Campbell MRN: [id number] Date of Birth: Feb 04, 1966 Referring Provider (PT): Blanch Media, [place]   Encounter Date: 04/25/2022   PT End of Session - 04/25/22 0951     Visit Number 1    Number of Visits 12    Date for PT Re-Evaluation 07/14/22    PT Start Time 0952    PT Stop Time 1048    PT Time Calculation (min) 56 min    Activity Tolerance Patient tolerated treatment well;Patient limited by pain    Behavior During Therapy [facility] for tasks assessed/performed             Past Medical History:  Diagnosis Date   Barrett's esophagus    CKD (chronic kidney disease) stage 3, GFR 30-59 ml/min (HCC)    GERD (gastroesophageal reflux disease)    Hyperlipidemia    Kidney stones    Lung mass    PET negative on 05/22/18   Seizures ([place])    Stroke ([place])    Vitamin D deficiency     Past Surgical History:  Procedure Laterality Date   CYSTOSCOPY W/ URETERAL STENT PLACEMENT  05/05/2021   Procedure: CYSTOSCOPY WITH LEFT URETERAL STENT REPLACEMENT;  Surgeon: Cleon Gustin, MD;  Location: [facility];  Service: Urology;;   CYSTOSCOPY WITH RETROGRADE PYELOGRAM, URETEROSCOPY AND STENT PLACEMENT Left 05/05/2021   Procedure: CYSTOSCOPY WITH BILATERAL RETROGRADE PYELOGRAM, BILATERAL URETEROSCOPY,  RIGHT URETERAL STENT PLACEMENT;  Surgeon: Cleon Gustin, MD;  Location: [facility];  Service: Urology;  Laterality: Left;   HIP SURGERY Left    HOLMIUM LASER APPLICATION Left [id number]   Procedure: HOLMIUM LASER LITHOTRIPSY RIGHT URETERAL CALCULUS;  Surgeon: Cleon Gustin, MD;  Location: [facility];  Service: Urology;  Laterality: Left;   KNEE SURGERY Left    STONE EXTRACTION WITH BASKET  05/05/2021   Procedure: LEFT URETERAL STONE EXTRACTION WITH BASKET;  Surgeon: Cleon Gustin, MD;  Location: [facility];   Service: Urology;;    There were no vitals filed for this visit.    Subjective Assessment - 04/25/22 0951     Subjective Patient reports that her back has been bothering a long time, but it has gotten a lot worse over the past five months. She notes that she was able to walk upright, but now she is only able to walk short distances prior to having to bend over and sit down. She notes that she has fallen 4-5 times in the past six months. One of her falls she thinks that she broke her 2nd and 3rd digits of her left hand, but she reduced these fractures on her own. She will also get very short of breath with short walks.    Pertinent History CKD,    Limitations Walking;Standing    How long can you stand comfortably? <5 minutes    How long can you walk comfortably? <5 minutes    Patient Stated Goals reduced pain, stand and walk longer    Currently in Pain? Yes    Pain Score 10-Worst pain ever    Pain Location Back    Pain Orientation Lower    Pain Descriptors / Indicators Burning;Throbbing;Tingling    Pain Type Chronic pain    Pain Radiating Towards can experience pain or tingling in both legs, but never both legs at the same time    Pain Onset  More than a month ago    Pain Frequency Intermittent    Aggravating Factors  standing and sitting upright, cleaning her house, washing dishes    Pain Relieving Factors sitting in recliner    Effect of Pain on Daily Activities limits her ability to walk, stand, and complete other household or daily activities                [facility] PT Assessment - 04/25/22 0001       Assessment   Medical Diagnosis Chronic midline low back pain with bilateral sciatica; Multiple falls    Referring Provider (PT) Blanch Media, FNP    Onset Date/Surgical Date --   5 months ago   Next MD Visit None scheduled    Prior Therapy No      Precautions   Precautions Fall      Restrictions   Weight Bearing Restrictions No      Balance Screen   Has the patient fallen in  the past 6 months Yes    How many times? 4-5    Has the patient had a decrease in activity level because of a fear of falling?  No    Is the patient reluctant to leave their home because of a fear of falling?  Yes      [place] Private residence    [place] to enter    Entrance Stairs-Number of Steps 3   very difficult; step to pattern   Entrance Stairs-Rails Can reach both    Home Layout One level      Prior Function   Level of Independence Independent    Leisure unable to do any hobbies due to pain      Cognition   Overall Cognitive Status Within Functional Limits for tasks assessed    Attention Focused    Focused Attention Appears intact    Memory Appears intact    Awareness Appears intact    Problem Solving Appears intact      Sensation   Additional Comments Patient reports no numbness or tingling currently      Posture/Postural Control   Posture/Postural Control Postural limitations    Postural Limitations Rounded Shoulders;Forward head;Flexed trunk      ROM / Strength   AROM / PROM / Strength Strength      Strength   Strength Assessment Site Hip;Knee;Ankle    Right/Left Hip Right;Left    Right Hip Flexion 3+/5    Left Hip Flexion 3/5   painful   Right/Left Knee Right;Left    Right Knee Flexion 3/5    Right Knee Extension 3+/5    Left Knee Flexion 3/5   painful   Left Knee Extension 3/5   painful   Right/Left Ankle Right;Left    Right Ankle Dorsiflexion 4/5    Left Ankle Dorsiflexion 4/5      Palpation   Spinal mobility Lumbar: hypomobile with familiar pain beginning at L2 and referred lower extremity pain beginning at L4    Palpation comment TTP: bilateral lumbar paraspinals, QL,      Transfers   Transfers Sit to Stand;Stand to Sit    Sit to Stand 6: Modified independent (Device/Increase time);With upper extremity assist    Stand to Sit 6: Modified independent (Device/Increase time);With  upper extremity assist;Uncontrolled descent      Ambulation/Gait   Ambulation/Gait Yes    Ambulation/Gait Assistance 6: Modified independent (Device/Increase time)    Assistive  device None    Gait Pattern Shuffle;Trunk flexed    Ambulation Surface Level;Indoor    Gait velocity decreased                        Objective measurements completed on examination: See above findings.       [facility] Adult PT Treatment/Exercise - 04/25/22 0001       Modalities   Modalities Electrical Stimulation;Moist Heat   no redness or adverse reaction to today's modalities; provided temporary relief, but pain returned with walking     Moist Heat Therapy   Number Minutes Moist Heat 10 Minutes    Moist Heat Location Lumbar Spine      Electrical Stimulation   Electrical Stimulation Location 10    Electrical Stimulation Action IFC    Electrical Stimulation Parameters 80-150 Hz w/ 40% scan x 10 minutes    Electrical Stimulation Goals Tone;Pain                          PT Long Term Goals - 04/25/22 1332       PT LONG TERM GOAL #1   Title Patient will be independent with her HEP.    Time 6    Period Weeks    Status New    Target Date 06/06/22      PT LONG TERM GOAL #2   Title Patient will be able to walk at least 100 feet prior to having to sit down due to pain or fatigue    Baseline 04/25/22: 43' limited by throbbing pain in low back    Time 6    Period Weeks    Status New    Target Date 06/06/22      PT LONG TERM GOAL #3   Title Patient will be able to complete her daily activities without her familiar low back pain exceeding 7/10.    Time 6    Period Weeks    Status New    Target Date 06/06/22      PT LONG TERM GOAL #4   Title Patient will report being able to clean her house with no more than moderate difficulty.    Time 6    Period Weeks    Status New    Target Date 06/06/22                    Plan - 04/25/22 1035     Clinical  Impression Statement Patient is a 56 year old female presenting to physical therapy with low back pain and a history of falling. She presented with high pain severity and irritability with standing and walking being the most aggravating to her familair symptoms. Her familiar low back and radiating symptoms were also reproduced with lumbar joint mobility testing. Her lumbar AROM and balance testing were unable to be performed at this time due to the irritability of her familiar low back pain. These will be assessed, as able, at following appointments. Recommend that she continue with skilled physical therapy to address her remaining impairments to maximize her safety and functional mobility.    Personal Factors and Comorbidities Comorbidity 3+;Time since onset of injury/illness/exacerbation;Transportation    Comorbidities CKD, history of CVA, and history of seizure    Examination-Activity Limitations Locomotion Level;Transfers;Stairs;Stand    Examination-Participation Restrictions Cleaning;Meal Prep;Community Activity    Stability/Clinical Decision Making Unstable/Unpredictable    Clinical Decision Making High    Rehab Potential  Fair    PT Frequency 2x / week    PT Duration 6 weeks    PT Treatment/Interventions ADLs/Self Care Home Management;Electrical Stimulation;Moist Heat;Neuromuscular re-education;Balance training;Therapeutic exercise;Therapeutic activities;Functional mobility training;Stair training;Gait training;Patient/family education;Manual techniques;Taping;Cryotherapy    PT Next Visit Plan nustep, isometrics, LAQ, and modalities as needed    Consulted and Agree with Plan of Care Patient             Patient will benefit from skilled therapeutic intervention in order to improve the following deficits and impairments:  Abnormal gait, Decreased range of motion, Difficulty walking, Decreased activity tolerance, Pain, Hypomobility, Decreased balance, Decreased mobility, Decreased  strength  Visit Diagnosis: Other low back pain  History of falling     Problem List Patient Active Problem List   Diagnosis Date Noted   Bilateral ureteral calculi 05/13/2021   History of stroke 03/19/2021   Vitamin D deficiency 10/16/2020   Stage 3 chronic kidney disease ([place]) 10/16/2020   GERD (gastroesophageal reflux disease) 10/16/2020   Morbid obesity ([place]) 10/16/2020   Urolithiasis 10/15/2020   Mixed hyperlipidemia 10/08/2020   Migraine without aura and without status migrainosus, not intractable 02/20/2018   History of seizure 12/24/2015   Tobacco abuse 12/24/2015   Recurrent major depressive disorder in partial remission ([place]) 12/09/2014   Generalized anxiety disorder 12/09/2014   Memory loss 12/09/2014   Rationale for Evaluation and Treatment Rehabilitation   Darlin Coco, PT 04/25/2022, 7:17 PM  [facility] [place], [place], [address] Phone: [phone]   Fax:  [phone]  Name: Maria Campbell MRN: [id number] Date of Birth: [birth year]

## 2022-04-27 DIAGNOSIS — J449 Chronic obstructive pulmonary disease, unspecified: Secondary | ICD-10-CM | POA: Diagnosis not present

## 2022-04-27 DIAGNOSIS — I2510 Atherosclerotic heart disease of native coronary artery without angina pectoris: Secondary | ICD-10-CM | POA: Diagnosis not present

## 2022-04-27 DIAGNOSIS — R69 Illness, unspecified: Secondary | ICD-10-CM | POA: Diagnosis not present

## 2022-04-27 DIAGNOSIS — G40909 Epilepsy, unspecified, not intractable, without status epilepticus: Secondary | ICD-10-CM | POA: Diagnosis not present

## 2022-04-27 DIAGNOSIS — Z6841 Body Mass Index (BMI) 40.0 and over, adult: Secondary | ICD-10-CM | POA: Diagnosis not present

## 2022-04-27 DIAGNOSIS — I129 Hypertensive chronic kidney disease with stage 1 through stage 4 chronic kidney disease, or unspecified chronic kidney disease: Secondary | ICD-10-CM | POA: Diagnosis not present

## 2022-04-27 DIAGNOSIS — G629 Polyneuropathy, unspecified: Secondary | ICD-10-CM | POA: Diagnosis not present

## 2022-04-27 DIAGNOSIS — I700 Atherosclerosis of aorta: Secondary | ICD-10-CM | POA: Diagnosis not present

## 2022-04-27 DIAGNOSIS — G3184 Mild cognitive impairment, so stated: Secondary | ICD-10-CM | POA: Diagnosis not present

## 2022-04-27 DIAGNOSIS — E785 Hyperlipidemia, unspecified: Secondary | ICD-10-CM | POA: Diagnosis not present

## 2022-05-02 ENCOUNTER — Ambulatory Visit (HOSPITAL_COMMUNITY)
Admission: RE | Admit: 2022-05-02 | Discharge: 2022-05-02 | Disposition: A | Discharge status: Home / Self Care | Payer: Medicare HMO | Source: Ambulatory Visit | Attending: Family Medicine | Admitting: Family Medicine

## 2022-05-02 DIAGNOSIS — R928 Other abnormal and inconclusive findings on diagnostic imaging of breast: Secondary | ICD-10-CM | POA: Insufficient documentation

## 2022-05-02 DIAGNOSIS — N6489 Other specified disorders of breast: Secondary | ICD-10-CM | POA: Diagnosis not present

## 2022-05-03 ENCOUNTER — Ambulatory Visit: Payer: Medicare HMO

## 2022-05-03 ENCOUNTER — Other Ambulatory Visit: Payer: Self-pay | Admitting: Family Medicine

## 2022-05-03 ENCOUNTER — Other Ambulatory Visit (HOSPITAL_COMMUNITY): Payer: Self-pay | Admitting: Family Medicine

## 2022-05-03 DIAGNOSIS — E782 Mixed hyperlipidemia: Secondary | ICD-10-CM

## 2022-05-03 DIAGNOSIS — R928 Other abnormal and inconclusive findings on diagnostic imaging of breast: Secondary | ICD-10-CM

## 2022-05-03 DIAGNOSIS — M5442 Lumbago with sciatica, left side: Secondary | ICD-10-CM | POA: Diagnosis not present

## 2022-05-03 DIAGNOSIS — M5459 Other low back pain: Secondary | ICD-10-CM

## 2022-05-03 DIAGNOSIS — N1832 Chronic kidney disease, stage 3b: Secondary | ICD-10-CM

## 2022-05-03 DIAGNOSIS — M5441 Lumbago with sciatica, right side: Secondary | ICD-10-CM | POA: Diagnosis not present

## 2022-05-03 DIAGNOSIS — G8929 Other chronic pain: Secondary | ICD-10-CM | POA: Diagnosis not present

## 2022-05-03 DIAGNOSIS — Z9181 History of falling: Secondary | ICD-10-CM | POA: Diagnosis not present

## 2022-05-03 DIAGNOSIS — R296 Repeated falls: Secondary | ICD-10-CM | POA: Diagnosis not present

## 2022-05-03 NOTE — Therapy (Signed)
[facility] [place], [place], [address] Phone: [phone]   Fax:  [phone]  Physical Therapy Treatment  Patient Details  Name: Maria Campbell MRN: [id number] Date of Birth: 03/18/1966 Referring Provider (PT): Blanch Media, [place]   Encounter Date: 05/03/2022   PT End of Session - 05/03/22 1124     Visit Number 2    Number of Visits 12    Date for PT Re-Evaluation 07/14/22    PT Start Time 1115    PT Stop Time 1216    PT Time Calculation (min) 61 min    Activity Tolerance Patient tolerated treatment well;Patient limited by pain    Behavior During Therapy [facility] for tasks assessed/performed             Past Medical History:  Diagnosis Date   Barrett's esophagus    CKD (chronic kidney disease) stage 3, GFR 30-59 ml/min (HCC)    GERD (gastroesophageal reflux disease)    Hyperlipidemia    Kidney stones    Lung mass    PET negative on 05/22/18   Seizures ([place])    Stroke ([place])    Vitamin D deficiency     Past Surgical History:  Procedure Laterality Date   CYSTOSCOPY W/ URETERAL STENT PLACEMENT  05/05/2021   Procedure: CYSTOSCOPY WITH LEFT URETERAL STENT REPLACEMENT;  Surgeon: Cleon Gustin, MD;  Location: [facility];  Service: Urology;;   CYSTOSCOPY WITH RETROGRADE PYELOGRAM, URETEROSCOPY AND STENT PLACEMENT Left 05/05/2021   Procedure: CYSTOSCOPY WITH BILATERAL RETROGRADE PYELOGRAM, BILATERAL URETEROSCOPY,  RIGHT URETERAL STENT PLACEMENT;  Surgeon: Cleon Gustin, MD;  Location: [facility];  Service: Urology;  Laterality: Left;   HIP SURGERY Left    HOLMIUM LASER APPLICATION Left [id number]   Procedure: HOLMIUM LASER LITHOTRIPSY RIGHT URETERAL CALCULUS;  Surgeon: Cleon Gustin, MD;  Location: [facility];  Service: Urology;  Laterality: Left;   KNEE SURGERY Left    STONE EXTRACTION WITH BASKET  05/05/2021   Procedure: LEFT URETERAL STONE EXTRACTION WITH BASKET;  Surgeon: Cleon Gustin, MD;  Location: [facility];   Service: Urology;;    There were no vitals filed for this visit.   Subjective Assessment - 05/03/22 1123     Subjective Pt arrives for today's treatment session reporting 8/10 low back pain.    Pertinent History CKD,    Limitations Walking;Standing    How long can you stand comfortably? <5 minutes    How long can you walk comfortably? <5 minutes    Patient Stated Goals reduced pain, stand and walk longer    Currently in Pain? Yes    Pain Score 8     Pain Location Back    Pain Orientation Lower    Pain Onset More than a month ago                               [facility] Adult PT Treatment/Exercise - 05/03/22 0001       Exercises   Exercises Lumbar;Knee/Hip      Lumbar Exercises: Stretches   Single Knee to Chest Stretch 5 reps;10 seconds    Lower Trunk Rotation 5 reps;10 seconds      Lumbar Exercises: Aerobic   Nustep Lvl 2 x 15 mins      Lumbar Exercises: Seated   Other Seated Lumbar Exercises Shoulder rows, extension, horizontal abduction, ER x 20 reps w green tband      Knee/Hip Exercises:  Seated   Long Arc Quad Strengthening;Both;20 reps   green tband   Ball Squeeze 2 mins    Clamshell with TheraBand Green   20 reps   Marching Strengthening;Both;20 reps   green tband   Hamstring Curl Both;Strengthening;20 reps    Hamstring Limitations green tband      Modalities   Modalities Electrical Stimulation;Moist Heat      Moist Heat Therapy   Number Minutes Moist Heat 15 Minutes    Moist Heat Location Lumbar Spine      Electrical Stimulation   Electrical Stimulation Location 15    Electrical Stimulation Action IFC 80-150 Hz    Electrical Stimulation Parameters 40% scan x 15 mins    Electrical Stimulation Goals Pain;Tone                          PT Long Term Goals - 04/25/22 1332       PT LONG TERM GOAL #1   Title Patient will be independent with her HEP.    Time 6    Period Weeks    Status New    Target Date 06/06/22       PT LONG TERM GOAL #2   Title Patient will be able to walk at least 100 feet prior to having to sit down due to pain or fatigue    Baseline 04/25/22: 59' limited by throbbing pain in low back    Time 6    Period Weeks    Status New    Target Date 06/06/22      PT LONG TERM GOAL #3   Title Patient will be able to complete her daily activities without her familiar low back pain exceeding 7/10.    Time 6    Period Weeks    Status New    Target Date 06/06/22      PT LONG TERM GOAL #4   Title Patient will report being able to clean her house with no more than moderate difficulty.    Time 6    Period Weeks    Status New    Target Date 06/06/22                   Plan - 05/03/22 1209     Clinical Impression Statement Pt arrives for first treatment since evaluation reporting 8/10 throbbing low back pain.  Pt tolerate Nustep for warm-up with BLE and BUE use to increase acitivity tolerance and endurance.  Pt instructed in seated BLE exercises to increase strength and function while decreasing pain.  Pt also instructed in seated BUE exercises to assist with proper posture.  Supine lumbar stretches used to decrease paind and tone in lumbar spine.  Pt limited in LLE due to increased hip pain.  Normal responses to estim and MH noted upon removal.  Pt reported 6/10 low back pain upon completion of today's treatment session.    Personal Factors and Comorbidities Comorbidity 3+;Time since onset of injury/illness/exacerbation;Transportation    Comorbidities CKD, history of CVA, and history of seizure    Examination-Activity Limitations Locomotion Level;Transfers;Stairs;Stand    Examination-Participation Restrictions Cleaning;Meal Prep;Community Activity    Stability/Clinical Decision Making Unstable/Unpredictable    Rehab Potential Fair    PT Frequency 2x / week    PT Duration 6 weeks    PT Treatment/Interventions ADLs/Self Care Home Management;Electrical Stimulation;Moist Heat;Neuromuscular  re-education;Balance training;Therapeutic exercise;Therapeutic activities;Functional mobility training;Stair training;Gait training;Patient/family education;Manual techniques;Taping;Cryotherapy    PT Next Visit Plan  nustep, isometrics, LAQ, and modalities as needed    Consulted and Agree with Plan of Care Patient             Patient will benefit from skilled therapeutic intervention in order to improve the following deficits and impairments:  Abnormal gait, Decreased range of motion, Difficulty walking, Decreased activity tolerance, Pain, Hypomobility, Decreased balance, Decreased mobility, Decreased strength  Visit Diagnosis: Other low back pain  History of falling     Problem List Patient Active Problem List   Diagnosis Date Noted   Bilateral ureteral calculi 05/13/2021   History of stroke 03/19/2021   Vitamin D deficiency 10/16/2020   Stage 3 chronic kidney disease ([place]) 10/16/2020   GERD (gastroesophageal reflux disease) 10/16/2020   Morbid obesity ([place]) 10/16/2020   Urolithiasis 10/15/2020   Mixed hyperlipidemia 10/08/2020   Migraine without aura and without status migrainosus, not intractable 02/20/2018   History of seizure 12/24/2015   Tobacco abuse 12/24/2015   Recurrent major depressive disorder in partial remission ([place]) 12/09/2014   Generalized anxiety disorder 12/09/2014   Memory loss 12/09/2014   Rationale for Evaluation and Treatment Rehabilitation  Kathrynn Ducking, PTA 05/03/2022, 12:19 PM  [facility] [address] [place], [place], [address] Phone: [phone]   Fax:  [phone]  Name: Maria Campbell MRN: [id number] Date of Birth: 03-Mar-1966

## 2022-05-10 ENCOUNTER — Ambulatory Visit: Payer: Medicare HMO

## 2022-05-11 ENCOUNTER — Ambulatory Visit
Admission: RE | Admit: 2022-05-11 | Discharge: 2022-05-11 | Disposition: A | Discharge status: Home / Self Care | Payer: Medicare HMO | Source: Ambulatory Visit | Attending: Family Medicine | Admitting: Family Medicine

## 2022-05-11 DIAGNOSIS — R928 Other abnormal and inconclusive findings on diagnostic imaging of breast: Secondary | ICD-10-CM | POA: Diagnosis not present

## 2022-05-11 DIAGNOSIS — N6489 Other specified disorders of breast: Secondary | ICD-10-CM | POA: Diagnosis not present

## 2022-05-24 ENCOUNTER — Ambulatory Visit: Payer: Medicare HMO | Attending: Family Medicine

## 2022-05-24 DIAGNOSIS — M5459 Other low back pain: Secondary | ICD-10-CM | POA: Diagnosis not present

## 2022-05-24 DIAGNOSIS — Z9181 History of falling: Secondary | ICD-10-CM | POA: Insufficient documentation

## 2022-05-24 NOTE — Therapy (Signed)
OUTPATIENT PHYSICAL THERAPY TREATMENT NOTE   Patient Name: Maria Campbell MRN: [id number] DOB:15-Apr-1966, 56 y.o., female Today's Date: 05/24/2022  PCP: Gwenlyn Fudge, FNP REFERRING PROVIDER: Gwenlyn Fudge, FNP   PT End of Session - 05/24/22 1046     Visit Number 3    Number of Visits 12    Date for PT Re-Evaluation 07/14/22    PT Start Time 1045    PT Stop Time 1141    PT Time Calculation (min) 56 min    Activity Tolerance Patient tolerated treatment well;Patient limited by pain    Behavior During Therapy [facility] for tasks assessed/performed             Past Medical History:  Diagnosis Date   Barrett's esophagus    CKD (chronic kidney disease) stage 3, GFR 30-59 ml/min (HCC)    GERD (gastroesophageal reflux disease)    Hyperlipidemia    Kidney stones    Lung mass    PET negative on 05/22/18   Seizures (HCC)    Stroke (HCC)    Vitamin D deficiency    Past Surgical History:  Procedure Laterality Date   CYSTOSCOPY W/ URETERAL STENT PLACEMENT  05/05/2021   Procedure: CYSTOSCOPY WITH LEFT URETERAL STENT REPLACEMENT;  Surgeon: Malen Gauze, MD;  Location: [facility];  Service: Urology;;   CYSTOSCOPY WITH RETROGRADE PYELOGRAM, URETEROSCOPY AND STENT PLACEMENT Left 05/05/2021   Procedure: CYSTOSCOPY WITH BILATERAL RETROGRADE PYELOGRAM, BILATERAL URETEROSCOPY,  RIGHT URETERAL STENT PLACEMENT;  Surgeon: Malen Gauze, MD;  Location: [facility];  Service: Urology;  Laterality: Left;   HIP SURGERY Left    HOLMIUM LASER APPLICATION Left 05/05/2021   Procedure: HOLMIUM LASER LITHOTRIPSY RIGHT URETERAL CALCULUS;  Surgeon: Malen Gauze, MD;  Location: [facility];  Service: Urology;  Laterality: Left;   KNEE SURGERY Left    STONE EXTRACTION WITH BASKET  05/05/2021   Procedure: LEFT URETERAL STONE EXTRACTION WITH BASKET;  Surgeon: Malen Gauze, MD;  Location: [facility];  Service: Urology;;   Patient Active Problem List   Diagnosis Date Noted   Bilateral ureteral calculi  05/13/2021   History of stroke 03/19/2021   Vitamin D deficiency 10/16/2020   Stage 3 chronic kidney disease (HCC) 10/16/2020   GERD (gastroesophageal reflux disease) 10/16/2020   Morbid obesity (HCC) 10/16/2020   Urolithiasis 10/15/2020   Mixed hyperlipidemia 10/08/2020   Migraine without aura and without status migrainosus, not intractable 02/20/2018   History of seizure 12/24/2015   Tobacco abuse 12/24/2015   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) 12/09/2014   Generalized anxiety disorder 12/09/2014   Memory loss 12/09/2014    REFERRING DIAG: Chronic midline low back pain with bilateral sciatica; Multiple falls   THERAPY DIAG:  Other low back pain  History of falling  Rationale for Evaluation and Treatment Rehabilitation  PERTINENT HISTORY: CKD  PRECAUTIONS: Fall   SUBJECTIVE: Pt arrives early for today's treatment session.Marland Kitchen   PAIN:  Are you having pain?  Pt reports left breast soreness due to mammogram and other testing.   TODAY'S TREATMENT:                                      7/5 EXERCISE LOG  Exercise Repetitions and Resistance Comments  Nustep Lvl 3 x 15 mins   Standing Hip Flexion BLE, 20 reps Cues for posture  Standing Hip Abduction BLE, 20 reps Cues for  posture  Standing Hip Extension BLE, 20 reps Cues for posture  Heel/Toe Raises 20 reps each   Long Arc Quad 3# 2 mins   Marching 3# 2 mins   Ball Squeeze 2 mins   Seated Hip Abduction Green Tband x 20 reps   Ham Curls Green Tband x 20 reps, BLE    Blank cell = exercise not performed today   Modalities  Date:  Unattended Estim: Lumbar, IFC 80-150 Hz, 15 mins, Pain and Tone Hot Pack: Lumbar, 15 mins, Pain and Tone     PATIENT EDUCATION: Education details: continuation of HEP Person educated: Patient Education method: Explanation, Demonstration, Tactile cues, and Verbal cues Education comprehension: verbalized understanding, returned demonstration, verbal cues required,  tactile cues required, and needs further education     PT Long Term Goals - 05/24/22 1046       PT LONG TERM GOAL #1   Title Patient will be independent with her HEP.    Time 6    Period Weeks    Status New    Target Date 06/06/22      PT LONG TERM GOAL #2   Title Patient will be able to walk at least 100 feet prior to having to sit down due to pain or fatigue    Baseline 04/25/22: 61' limited by throbbing pain in low back    Time 6    Period Weeks    Status New    Target Date 06/06/22      PT LONG TERM GOAL #3   Title Patient will be able to complete her daily activities without her familiar low back pain exceeding 7/10.    Time 6    Period Weeks    Status New    Target Date 06/06/22      PT LONG TERM GOAL #4   Title Patient will report being able to clean her house with no more than moderate difficulty.    Time 6    Period Weeks    Status New    Target Date 06/06/22              Plan - 05/24/22 1046     Clinical Impression Statement Pt arrives for today's treatment session reporting left breast soreness from mammogram performed earlier in the week.  Pt reports slight lower back pain, but does not give it a number.  Pt able to tolerate standing exercises today with rest breaks given as needed due to fatiuge and SHOB.  Pt challenged with LLE exercises due to previous surgery.  Normal responses to estim and MH noted upon removal.  Pt reported decrease in low back pain at completion of today's treatment session.    Personal Factors and Comorbidities Comorbidity 3+;Time since onset of injury/illness/exacerbation;Transportation    Comorbidities CKD, history of CVA, and history of seizure    Examination-Activity Limitations Locomotion Level;Transfers;Stairs;Stand    Examination-Participation Restrictions Cleaning;Meal Prep;Community Activity    Stability/Clinical Decision Making Unstable/Unpredictable    Rehab Potential Fair    PT Frequency 2x / week    PT Duration 6  weeks    PT Treatment/Interventions ADLs/Self Care Home Management;Electrical Stimulation;Moist Heat;Neuromuscular re-education;Balance training;Therapeutic exercise;Therapeutic activities;Functional mobility training;Stair training;Gait training;Patient/family education;Manual techniques;Taping;Cryotherapy    PT Next Visit Plan nustep, isometrics, LAQ, and modalities as needed    Consulted and Agree with Plan of Care Patient               Newman Pies, PTA 05/24/2022, 11:44 AM

## 2022-06-02 ENCOUNTER — Other Ambulatory Visit: Payer: Self-pay | Admitting: Family Medicine

## 2022-06-02 DIAGNOSIS — Z87898 Personal history of other specified conditions: Secondary | ICD-10-CM

## 2022-06-02 DIAGNOSIS — G43009 Migraine without aura, not intractable, without status migrainosus: Secondary | ICD-10-CM

## 2022-06-06 ENCOUNTER — Ambulatory Visit (INDEPENDENT_AMBULATORY_CARE_PROVIDER_SITE_OTHER): Payer: Medicare HMO | Admitting: Surgery

## 2022-06-06 ENCOUNTER — Encounter: Payer: Self-pay | Admitting: Surgery

## 2022-06-06 ENCOUNTER — Other Ambulatory Visit (HOSPITAL_COMMUNITY): Payer: Self-pay | Admitting: Surgery

## 2022-06-06 VITALS — BP 113/79 | HR 90 | Temp 98.0°F | Resp 165 | Ht 62.0 in | Wt 221.0 lb

## 2022-06-06 DIAGNOSIS — N6489 Other specified disorders of breast: Secondary | ICD-10-CM

## 2022-06-06 NOTE — Progress Notes (Unsigned)
[facility] History and Physical  Reason for Referral:*** Referring Physician: ***  Chief Complaint   New Patient (Initial Visit)     Maria Campbell is a 56 y.o. female.  HPI: ***.  The *** started *** and has had a duration of ***.  It is associated with ***.  The *** is improved with ***, and is made worse with ***.    Quality*** Context***  Past Medical History:  Diagnosis Date   Barrett's esophagus    CKD (chronic kidney disease) stage 3, GFR 30-59 ml/min (HCC)    GERD (gastroesophageal reflux disease)    Hyperlipidemia    Kidney stones    Lung mass    PET negative on 05/22/18   Seizures (HCC)    Stroke (HCC)    Vitamin D deficiency     Past Surgical History:  Procedure Laterality Date   CYSTOSCOPY W/ URETERAL STENT PLACEMENT  05/05/2021   Procedure: CYSTOSCOPY WITH LEFT URETERAL STENT REPLACEMENT;  Surgeon: Malen Gauze, MD;  Location: [facility];  Service: Urology;;   CYSTOSCOPY WITH RETROGRADE PYELOGRAM, URETEROSCOPY AND STENT PLACEMENT Left 05/05/2021   Procedure: CYSTOSCOPY WITH BILATERAL RETROGRADE PYELOGRAM, BILATERAL URETEROSCOPY,  RIGHT URETERAL STENT PLACEMENT;  Surgeon: Malen Gauze, MD;  Location: [facility];  Service: Urology;  Laterality: Left;   HIP SURGERY Left    HOLMIUM LASER APPLICATION Left 05/05/2021   Procedure: HOLMIUM LASER LITHOTRIPSY RIGHT URETERAL CALCULUS;  Surgeon: Malen Gauze, MD;  Location: [facility];  Service: Urology;  Laterality: Left;   KNEE SURGERY Left    STONE EXTRACTION WITH BASKET  05/05/2021   Procedure: LEFT URETERAL STONE EXTRACTION WITH BASKET;  Surgeon: Malen Gauze, MD;  Location: [facility];  Service: Urology;;    Family History  Problem Relation Age of Onset   Heart disease Mother    Heart attack Mother    Heart disease Father    Heart failure Father    Heart disease Maternal Grandmother    Heart disease Maternal Grandfather    Diabetes Paternal Grandmother    Breast cancer Neg Hx      Social History   Tobacco Use   Smoking status: Every Day    Packs/day: 1.00    Years: 35.00    Total pack years: 35.00    Types: Cigarettes   Smokeless tobacco: Never  Vaping Use   Vaping Use: Never used  Substance Use Topics   Alcohol use: Never   Drug use: Never    Medications: {medication reviewed/display:3041432} Allergies as of 06/06/2022       Reactions   Meperidine Hives, Nausea And Vomiting   Fainting and n/v (Demerol)   Other Hives   Dakin's [sodium Hypochlorite] Nausea And Vomiting   Bleach        Medication List        Accurate as of June 06, 2022 12:57 PM. If you have any questions, ask your nurse or doctor.          albuterol 108 (90 Base) MCG/ACT inhaler Commonly known as: VENTOLIN HFA Inhale 2 puffs into the lungs every 6 (six) hours as needed.   aspirin EC 81 MG tablet Take 81 mg by mouth daily.   atorvastatin 20 MG tablet Commonly known as: LIPITOR TAKE 1 TABLET ONCE DAILY.   Farxiga 10 MG Tabs tablet Generic drug: dapagliflozin propanediol TAKE 1 TABLET DAILY BEFORE BREAKFAST.   gabapentin 300 MG capsule Commonly known as: NEURONTIN TAKE 1 CAPSULE DAILY.   lamoTRIgine  200 MG tablet Commonly known as: LAMICTAL TAKE 1 TABLET ONCE DAILY.   mirabegron ER 25 MG Tb24 tablet Commonly known as: MYRBETRIQ Take 1 tablet (25 mg total) by mouth daily.   nortriptyline 50 MG capsule Commonly known as: PAMELOR TAKE 2 CAPSULES IN THE EVENING.   omeprazole 40 MG capsule Commonly known as: PRILOSEC Take 1 capsule (40 mg total) by mouth daily.   umeclidinium-vilanterol 62.5-25 MCG/ACT Aepb Commonly known as: ANORO ELLIPTA Inhale 1 puff into the lungs daily at 6 (six) AM.         ROS:  {Review of Systems:30496}  Blood pressure 113/79, pulse 90, temperature 98 F (36.7 C), temperature source Oral, resp. rate (!) 165, height 5\' 2"  (1.575 m), weight 221 lb (100.2 kg), SpO2 98 %. Physical Exam  Results: No results found for  this or any previous visit (from the past 48 hour(s)).  No results found.   Assessment & Plan:  Maria Campbell is a 56 y.o. female with ***  -*** -The risk and benefits of *** were discussed including but not limited to ***.  After careful consideration, Maria Campbell has decided to ***.  -Follow up ***  All questions were answered to the satisfaction of the patient and family***.   Theophilus Kinds, DO [facility] [address] [place], [place] [address] [phone] (office)

## 2022-06-07 ENCOUNTER — Ambulatory Visit: Payer: Medicare HMO

## 2022-06-07 DIAGNOSIS — M5459 Other low back pain: Secondary | ICD-10-CM

## 2022-06-07 DIAGNOSIS — Z9181 History of falling: Secondary | ICD-10-CM | POA: Diagnosis not present

## 2022-06-07 NOTE — H&P (Signed)
[facility] History and Physical  Reason for Referral: Left breast complex sclerosing lesion Referring Physician: Shon Hale, FNP  Chief Complaint   New Patient (Initial Visit)     Maria Campbell is a 56 y.o. female.  HPI: Patient presents for evaluation for excision of left breast mass. The patient has no history of any masses, lumps, bumps, nipple changes or discharge.  She does complain of some left breast soreness which she was having prior to mammogram/biopsy.  She had menarche at age 34, and her first pregnancy at age 10. She is G1P1. She did not breastfeed her children.  She has no history of any family breast cancer.  She underwent menopause at 43.  She has never had any previous biopsies or and this was her first mammogram.  She has not had any chest radiation.  She does state that she had a cyst in her left breast that drained after her cat scratched her.  Her past medical history is significant for hyperlipidemia, seizure disorder, and kidney stones.  Her surgical history is significant for joint replacements.  She denies any history of breast surgery or abdominal surgery.  She denies use of blood thinning medications.  She smokes 1 pack/day.  She denies use of alcohol or illicit drugs.   Past Medical History:  Diagnosis Date   Barrett's esophagus    CKD (chronic kidney disease) stage 3, GFR 30-59 ml/min (HCC)    GERD (gastroesophageal reflux disease)    Hyperlipidemia    Kidney stones    Lung mass    PET negative on 05/22/18   Seizures (HCC)    Stroke (HCC)    Vitamin D deficiency     Past Surgical History:  Procedure Laterality Date   CYSTOSCOPY W/ URETERAL STENT PLACEMENT  05/05/2021   Procedure: CYSTOSCOPY WITH LEFT URETERAL STENT REPLACEMENT;  Surgeon: Malen Gauze, MD;  Location: [facility];  Service: Urology;;   CYSTOSCOPY WITH RETROGRADE PYELOGRAM, URETEROSCOPY AND STENT PLACEMENT Left 05/05/2021   Procedure: CYSTOSCOPY WITH BILATERAL  RETROGRADE PYELOGRAM, BILATERAL URETEROSCOPY,  RIGHT URETERAL STENT PLACEMENT;  Surgeon: Malen Gauze, MD;  Location: [facility];  Service: Urology;  Laterality: Left;   HIP SURGERY Left    HOLMIUM LASER APPLICATION Left 05/05/2021   Procedure: HOLMIUM LASER LITHOTRIPSY RIGHT URETERAL CALCULUS;  Surgeon: Malen Gauze, MD;  Location: [facility];  Service: Urology;  Laterality: Left;   KNEE SURGERY Left    STONE EXTRACTION WITH BASKET  05/05/2021   Procedure: LEFT URETERAL STONE EXTRACTION WITH BASKET;  Surgeon: Malen Gauze, MD;  Location: [facility];  Service: Urology;;    Family History  Problem Relation Age of Onset   Heart disease Mother    Heart attack Mother    Heart disease Father    Heart failure Father    Heart disease Maternal Grandmother    Heart disease Maternal Grandfather    Diabetes Paternal Grandmother    Breast cancer Neg Hx     Social History   Tobacco Use   Smoking status: Every Day    Packs/day: 1.00    Years: 35.00    Total pack years: 35.00    Types: Cigarettes   Smokeless tobacco: Never  Vaping Use   Vaping Use: Never used  Substance Use Topics   Alcohol use: Never   Drug use: Never    Medications: I have reviewed the patient's current medications. Allergies as of 06/06/2022       Reactions  Meperidine Hives, Nausea And Vomiting   Fainting and n/v (Demerol)   Other Hives   Dakin's [sodium Hypochlorite] Nausea And Vomiting   Bleach        Medication List        Accurate as of June 06, 2022 12:57 PM. If you have any questions, ask your nurse or doctor.          albuterol 108 (90 Base) MCG/ACT inhaler Commonly known as: VENTOLIN HFA Inhale 2 puffs into the lungs every 6 (six) hours as needed.   aspirin EC 81 MG tablet Take 81 mg by mouth daily.   atorvastatin 20 MG tablet Commonly known as: LIPITOR TAKE 1 TABLET ONCE DAILY.   Farxiga 10 MG Tabs tablet Generic drug: dapagliflozin propanediol TAKE 1 TABLET DAILY BEFORE  BREAKFAST.   gabapentin 300 MG capsule Commonly known as: NEURONTIN TAKE 1 CAPSULE DAILY.   lamoTRIgine 200 MG tablet Commonly known as: LAMICTAL TAKE 1 TABLET ONCE DAILY.   mirabegron ER 25 MG Tb24 tablet Commonly known as: MYRBETRIQ Take 1 tablet (25 mg total) by mouth daily.   nortriptyline 50 MG capsule Commonly known as: PAMELOR TAKE 2 CAPSULES IN THE EVENING.   omeprazole 40 MG capsule Commonly known as: PRILOSEC Take 1 capsule (40 mg total) by mouth daily.   umeclidinium-vilanterol 62.5-25 MCG/ACT Aepb Commonly known as: ANORO ELLIPTA Inhale 1 puff into the lungs daily at 6 (six) AM.         ROS:  Constitutional: negative for chills, fatigue, and fevers Eyes: negative for visual disturbance and pain Ears, nose, mouth, throat, and face: negative for ear drainage, sore throat, and sinus problems Respiratory: negative for cough, wheezing, and shortness of breath Cardiovascular: negative for chest pain and palpitations Gastrointestinal: negative for abdominal pain, nausea, and vomiting Genitourinary:negative for dysuria, frequency, and urinary retention Integument/breast: negative for dryness and rash Hematologic/lymphatic: negative for bleeding and lymphadenopathy Musculoskeletal:negative for back pain, neck pain, and joint pain Neurological: negative for dizziness, tremors, and numbness Endocrine: negative for temperature intolerance  Blood pressure 113/79, pulse 90, temperature 98 F (36.7 C), temperature source Oral, resp. rate (!) 165, height 5\' 2"  (1.575 m), weight 221 lb (100.2 kg), SpO2 98 %. Physical Exam Vitals reviewed.  Constitutional:      Appearance: Normal appearance.  HENT:     Head: Normocephalic and atraumatic.  Cardiovascular:     Rate and Rhythm: Normal rate and regular rhythm.  Pulmonary:     Effort: Pulmonary effort is normal.     Breath sounds: Normal breath sounds.  Chest:  Breasts:    Right: Normal. No swelling, bleeding,  inverted nipple, mass, nipple discharge or tenderness.     Left: Tenderness present. No swelling, bleeding, inverted nipple, mass or nipple discharge.  Abdominal:     General: There is no distension.     Palpations: Abdomen is soft.  Musculoskeletal:        General: Normal range of motion.     Cervical back: Normal range of motion.  Lymphadenopathy:     Upper Body:     Right upper body: No supraclavicular or axillary adenopathy.     Left upper body: No supraclavicular or axillary adenopathy.  Skin:    General: Skin is warm and dry.     Comments: Multiple scars across abdomen and bilateral arms  Neurological:     General: No focal deficit present.     Mental Status: She is alert and oriented to person, place, and time.  Psychiatric:  Mood and Affect: Mood normal.        Behavior: Behavior normal.     Results: Bilateral screening mammogram (04/18/2022): IMPRESSION: Further evaluation is suggested for possible distortion in the left breast.   RECOMMENDATION: Diagnostic mammogram and possibly ultrasound of the left breast. (Code:FI-L-34M)   The patient will be contacted regarding the findings, and additional imaging will be scheduled.   BI-RADS CATEGORY  0: Incomplete. Need additional imaging evaluation and/or prior mammograms for comparison.  Left breast diagnostic mammogram (05/02/2022): RECOMMENDATION: Stereotactic biopsy of LEFT breast distortion.   I have discussed the findings and recommendations with the patient. If applicable, a reminder letter will be sent to the patient regarding the next appointment.   BI-RADS CATEGORY  4: Suspicious.  Left breast and axillary ultrasound (05/02/2022): RECOMMENDATION: Stereotactic biopsy of LEFT breast distortion.   I have discussed the findings and recommendations with the patient. If applicable, a reminder letter will be sent to the patient regarding the next appointment.   BI-RADS CATEGORY  4: Suspicious.  Left  breast or tactic core needle biopsy (05/11/2022): IMPRESSION: Stereotactic-guided biopsy of LEFT breast distortion. No apparent complications.  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 This was found to be concordant by Dr. Norva Pavlov, with surgical consultation for consideration of excision recommended.  Assessment & Plan:  Maria Campbell is a 56 y.o. female who presents for evaluation for left breast lumpectomy for complex sclerosing lesion  -I explained to the patient that while her current pathology does not demonstrate cancer, the concern is that there could be an underlying cancer or precancerous lesion that was not able to be identified on biopsy.  For this reason, we recommend lumpectomy to fully evaluate the area of concern. -Given the size of the area, she will need radiofrequency tag placed by radiology -The risk and benefits of left breast lumpectomy were discussed including but not limited to bleeding, infection, injury to surrounding structures, need for additional procedure (such as margin excision versus sentinel lymph node if pathology comes back with cancer), and poor wound healing.  After careful consideration, Maria Campbell has decided to proceed with left breast lumpectomy.  -Patient tentatively scheduled for surgery on 8/9 -Information provided to the patient regarding lumpectomy -According to the patient's [facility] risk calculation, she has a 0.9% 5 year risk of breast cancer and a 5.7% lifetime risk of breast cancer   All questions were answered to the satisfaction of the patient.   Theophilus Kinds, DO [facility] [address] [place], [place] [address] [phone] (office)

## 2022-06-07 NOTE — Therapy (Addendum)
OUTPATIENT PHYSICAL THERAPY TREATMENT NOTE   Patient Name: Maria Campbell MRN: [id number] DOB:1965-12-27, 56 y.o., female Today's Date: 06/07/2022  PCP: Loman Brooklyn, FNP REFERRING PROVIDER: Loman Brooklyn, FNP   PT End of Session - 06/07/22 1055     Visit Number 4    Number of Visits 12    Date for PT Re-Evaluation 07/14/22    PT Start Time 4503    PT Stop Time 1145    PT Time Calculation (min) 62 min    Activity Tolerance Patient tolerated treatment well;Patient limited by pain    Behavior During Therapy [facility] for tasks assessed/performed             Past Medical History:  Diagnosis Date   Barrett's esophagus    CKD (chronic kidney disease) stage 3, GFR 30-59 ml/min (HCC)    GERD (gastroesophageal reflux disease)    Hyperlipidemia    Kidney stones    Lung mass    PET negative on 05/22/18   Seizures (HCC)    Stroke (HCC)    Vitamin D deficiency    Past Surgical History:  Procedure Laterality Date   CYSTOSCOPY W/ URETERAL STENT PLACEMENT  05/05/2021   Procedure: CYSTOSCOPY WITH LEFT URETERAL STENT REPLACEMENT;  Surgeon: Cleon Gustin, MD;  Location: [facility];  Service: Urology;;   CYSTOSCOPY WITH RETROGRADE PYELOGRAM, URETEROSCOPY AND STENT PLACEMENT Left 05/05/2021   Procedure: CYSTOSCOPY WITH BILATERAL RETROGRADE PYELOGRAM, BILATERAL URETEROSCOPY,  RIGHT URETERAL STENT PLACEMENT;  Surgeon: Cleon Gustin, MD;  Location: [facility];  Service: Urology;  Laterality: Left;   HIP SURGERY Left    HOLMIUM LASER APPLICATION Left 8/88/2800   Procedure: HOLMIUM LASER LITHOTRIPSY RIGHT URETERAL CALCULUS;  Surgeon: Cleon Gustin, MD;  Location: [facility];  Service: Urology;  Laterality: Left;   KNEE SURGERY Left    STONE EXTRACTION WITH BASKET  05/05/2021   Procedure: LEFT URETERAL STONE EXTRACTION WITH BASKET;  Surgeon: Cleon Gustin, MD;  Location: [facility];  Service: Urology;;   Patient Active Problem List   Diagnosis Date Noted   Bilateral ureteral calculi  05/13/2021   History of stroke 03/19/2021   Vitamin D deficiency 10/16/2020   Stage 3 chronic kidney disease ([place]) 10/16/2020   GERD (gastroesophageal reflux disease) 10/16/2020   Morbid obesity ([place]) 10/16/2020   Urolithiasis 10/15/2020   Mixed hyperlipidemia 10/08/2020   Migraine without aura and without status migrainosus, not intractable 02/20/2018   History of seizure 12/24/2015   Tobacco abuse 12/24/2015   Recurrent major depressive disorder in partial remission ([place]) 12/09/2014   Generalized anxiety disorder 12/09/2014   Memory loss 12/09/2014    REFERRING DIAG: Chronic midline low back pain with bilateral sciatica; Multiple falls   THERAPY DIAG:  Other low back pain  History of falling  Rationale for Evaluation and Treatment Rehabilitation  PERTINENT HISTORY: CKD  PRECAUTIONS: Fall   SUBJECTIVE: Pt arrives early for today's treatment session. Pt reports performing HEP at home regularly and feels that it is helping  PAIN:  Are you having pain?  Pt reports left breast pain due to doctors appointment and testing performed yesterday.   TODAY'S TREATMENT:                                      7/19 EXERCISE LOG  Exercise Repetitions and Resistance Comments  Nustep Lvl 4 x 15 mins   Standing Hip Flexion BLE,  20 reps Cues for posture  Standing Hip Abduction BLE, 20 reps Cues for posture  Standing Hip Extension BLE, 20 reps Cues for posture  Heel/Toe Raises 20 reps each   Long Arc Quad 4# 2 mins, BLE   Marching 4# 2 mins, BLE   Ball Squeeze 2 mins   Seated Hip Abduction Green Tband x 20 reps   Ham Curls Green Tband x 20 reps, BLE    Blank cell = exercise not performed today   Modalities  Date:  Unattended Estim: Lumbar, IFC 80-150 Hz, 15 mins, Pain and Tone Hot Pack: Lumbar, 15 mins, Pain and Tone     PATIENT EDUCATION: Education details: continuation of HEP Person educated: Patient Education method: Explanation, Demonstration, Tactile cues, and Verbal  cues Education comprehension: verbalized understanding, returned demonstration, verbal cues required, tactile cues required, and needs further education     PT Long Term Goals - 06/07/22 1059       PT LONG TERM GOAL #1   Title Patient will be independent with her HEP.    Time 6    Period Weeks    Status Achieved    Target Date 06/06/22      PT LONG TERM GOAL #2   Title Patient will be able to walk at least 100 feet prior to having to sit down due to pain or fatigue    Baseline 04/25/22: 59' limited by throbbing pain in low back    Time 6    Period Weeks    Status On-going    Target Date 06/06/22      PT LONG TERM GOAL #3   Title Patient will be able to complete her daily activities without her familiar low back pain exceeding 7/10.    Baseline 06/07/22: depends on they day and amount of activity, 5/10 on average    Time 6    Period Weeks    Status Partially Met    Target Date 06/06/22      PT LONG TERM GOAL #4   Title Patient will report being able to clean her house with no more than moderate difficulty.    Baseline 06/07/22: getting easier, but continues to require seated rest breaks with activity    Time 6    Period Weeks    Status On-going    Target Date 06/06/22              Plan - 06/07/22 1149     Clinical Impression Statement Pt arrives for today's treatment session denying any back pain, but reports left breast pain due to doctor's appointment and testing yesterday.  Pt able to tolerate increased resistance on Nustep today without issue.  Pt requiring several standing rest breaks during standing exercises due to fatigue.  Pt able to tolerate increased resistance with seated LAQ and marching without issue.  Normal responses to estim and vaso noted upon removal.  Pt is making good progress towards all of her goals at this time.  Pt denied any pain at completion of today's treatment session.    Personal Factors and Comorbidities Comorbidity 3+;Time since onset of  injury/illness/exacerbation;Transportation    Comorbidities CKD, history of CVA, and history of seizure    Examination-Activity Limitations Locomotion Level;Transfers;Stairs;Stand    Examination-Participation Restrictions Cleaning;Meal Prep;Community Activity    Stability/Clinical Decision Making Unstable/Unpredictable    Rehab Potential Fair    PT Frequency 2x / week    PT Duration 6 weeks    PT Treatment/Interventions ADLs/Self Care Home Management;Electrical  Stimulation;Moist Heat;Neuromuscular re-education;Balance training;Therapeutic exercise;Therapeutic activities;Functional mobility training;Stair training;Gait training;Patient/family education;Manual techniques;Taping;Cryotherapy    PT Next Visit Plan nustep, isometrics, LAQ, and modalities as needed    Consulted and Agree with Plan of Care Patient                Kathrynn Ducking, PTA 06/07/2022, 11:51 AM   PHYSICAL THERAPY DISCHARGE SUMMARY  Visits from Start of Care: 4  Current functional level related to goals / functional outcomes: Patient was able to partially meet her goals for physical therapy. However, she was unable to completely meet her goals and is being discharged at this time.    Remaining deficits: Pain and fatigue    Education / Equipment: HEP    Patient agrees to discharge. Patient goals were partially met. Patient is being discharged due to not returning since the last visit.  Jacqulynn Cadet, PT, DPT

## 2022-06-15 ENCOUNTER — Encounter: Payer: Self-pay | Admitting: Pulmonary Disease

## 2022-06-15 ENCOUNTER — Ambulatory Visit (INDEPENDENT_AMBULATORY_CARE_PROVIDER_SITE_OTHER): Payer: Medicare HMO | Admitting: Pulmonary Disease

## 2022-06-15 DIAGNOSIS — Z720 Tobacco use: Secondary | ICD-10-CM | POA: Diagnosis not present

## 2022-06-15 DIAGNOSIS — G4721 Circadian rhythm sleep disorder, delayed sleep phase type: Secondary | ICD-10-CM

## 2022-06-15 NOTE — Addendum Note (Signed)
Addended by: Carleene Mains D on: 06/15/2022 09:31 AM   Modules accepted: Orders

## 2022-06-15 NOTE — Progress Notes (Signed)
Subjective:    Patient ID: Maria Campbell, female    DOB: 09-Jul-1966, 56 y.o.   MRN: [id number]  HPI  Chief Complaint  Patient presents with   Consult    Sleep consult ref by Deliah Boston. Not sleeping well at night    Maria Campbell is a 56 year old smoker from [place] female who was referred for evaluation of COPD and sleep disordered breathing. She smoked about 2 packs/day since age 5, and has more recently cut down to 1 pack/day, more than 50 pack years.  She was found to have a left lower lobe nodule which on subsequent PET scan in 2019 was noted to be scarring, not hypermetabolic.  She reports mild shortness of breath and a "smoker's cough".  She was placed on Anoro 2 months ago with good benefit.  She uses albuterol on an as-needed basis.  She reports waking up about 3 times per month with shortness of breath.  She reports being a very poor sleeper for many years.  She is disabled since 2004.  Bedtime is as late as 3 AM, sleep latency about 15 minutes, she sleeps on her back for 2 pillows, reports 1-2 nocturnal awakenings.  Her to cannot sleep with her.  Wake up time can be as late as 2 to 3 PM with tiredness and dryness of mouth.  Weight has fluctuated over the last 3 years.  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   Past Medical History:  Diagnosis Date   Barrett's esophagus    CKD (chronic kidney disease) stage 3, GFR 30-59 ml/min (HCC)    GERD (gastroesophageal reflux disease)    Hyperlipidemia    Kidney  stones    Lung mass    PET negative on 05/22/18   Seizures (HCC)    Stroke (HCC)    Vitamin D deficiency    Past Surgical History:  Procedure Laterality Date   CYSTOSCOPY W/ URETERAL STENT PLACEMENT  05/05/2021   Procedure: CYSTOSCOPY WITH LEFT URETERAL STENT REPLACEMENT;  Surgeon: Malen Gauze, MD;  Location: [facility];  Service: Urology;;   CYSTOSCOPY WITH RETROGRADE PYELOGRAM, URETEROSCOPY AND STENT PLACEMENT Left 05/05/2021   Procedure: CYSTOSCOPY WITH BILATERAL RETROGRADE PYELOGRAM, BILATERAL URETEROSCOPY,  RIGHT URETERAL STENT PLACEMENT;  Surgeon: Malen Gauze, MD;  Location: [facility];  Service: Urology;  Laterality: Left;   HIP SURGERY Left    HOLMIUM LASER APPLICATION Left 05/05/2021   Procedure: HOLMIUM LASER LITHOTRIPSY RIGHT URETERAL CALCULUS;  Surgeon: Malen Gauze, MD;  Location: [facility];  Service: Urology;  Laterality: Left;   KNEE SURGERY Left    STONE EXTRACTION WITH BASKET  05/05/2021   Procedure: LEFT URETERAL STONE EXTRACTION WITH BASKET;  Surgeon: Malen Gauze, MD;  Location: [facility];  Service: Urology;;    Allergies  Allergen Reactions   Meperidine Hives and Nausea And Vomiting    Fainting and n/v (Demerol)    Other Hives   Dakin's [Sodium Hypochlorite] Nausea And Vomiting    Bleach    Social History  Socioeconomic History   Marital status: Single    Spouse name: Not on file   Number of children: 1   Years of education: Not on file   Highest education level: Not on file  Occupational History   Occupation: disability due to seizures and strokes  Tobacco Use   Smoking status: Every Day    Packs/day: 1.00    Years: 35.00    Total pack years: 35.00    Types: Cigarettes   Smokeless tobacco: Never  Vaping Use   Vaping Use: Never used  Substance and Sexual Activity   Alcohol use: Never   Drug use: Never   Sexual activity: Not Currently    Birth control/protection: None  Other Topics Concern   Not on file  Social History Narrative    Lives with a roommate - has one son - he lives hours away   Social Determinants of Health   Financial Resource Strain: Low Risk  (07/28/2021)   Overall Financial Resource Strain (CARDIA)    Difficulty of Paying Living Expenses: Not hard at all  Food Insecurity: No Food Insecurity (07/28/2021)   Hunger Vital Sign    Worried About Running Out of Food in the Last Year: Never true    Ran Out of Food in the Last Year: Never true  Transportation Needs: No Transportation Needs (07/28/2021)   PRAPARE - Administrator, Civil Service (Medical): No    Lack of Transportation (Non-Medical): No  Physical Activity: Insufficiently Active (07/28/2021)   Exercise Vital Sign    Days of Exercise per Week: 3 days    Minutes of Exercise per Session: 30 min  Stress: No Stress Concern Present (07/28/2021)   [employer] of Occupational Health - Occupational Stress Questionnaire    Feeling of Stress : Only a little  Social Connections: Moderately Isolated (07/28/2021)   Social Connection and Isolation Panel [NHANES]    Frequency of Communication with Friends and Family: More than three times a week    Frequency of Social Gatherings with Friends and Family: More than three times a week    Attends Religious Services: Never    Database administrator or Organizations: No    Attends Banker Meetings: Never    Marital Status: Living with partner  Intimate Partner Violence: Not At Risk (07/28/2021)   Humiliation, Afraid, Rape, and Kick questionnaire    Fear of Current or Ex-Partner: No    Emotionally Abused: No    Physically Abused: No    Sexually Abused: No     Family History  Problem Relation Age of Onset   Heart disease Mother    Heart attack Mother    Heart disease Father    Heart failure Father    Heart disease Maternal Grandmother    Heart disease Maternal Grandfather    Diabetes Paternal Grandmother    Breast cancer Neg Hx       Review of Systems Constitutional:  negative for anorexia, fevers and sweats  Eyes: negative for irritation, redness and visual disturbance  Ears, nose, mouth, throat, and face: negative for earaches, epistaxis, nasal congestion and sore throat  Respiratory: negative for cough, dyspnea on exertion, sputum and wheezing  Cardiovascular: negative for chest pain, dyspnea, lower extremity edema, orthopnea, palpitations and syncope  Gastrointestinal: negative for abdominal pain, constipation, diarrhea, melena, nausea and vomiting  Genitourinary:negative for dysuria, frequency and hematuria  Hematologic/lymphatic: negative for bleeding, easy bruising and lymphadenopathy  Musculoskeletal:negative for arthralgias, muscle weakness and stiff  joints  Neurological: negative for coordination problems, gait problems, headaches and weakness  Endocrine: negative for diabetic symptoms including polydipsia, polyuria and weight loss     Objective:   Physical Exam   Gen. Pleasant, obese, in no distress, normal affect ENT - no pallor,icterus, edentulous class 2-3 airway Neck: No JVD, no thyromegaly, no carotid bruits Lungs: no use of accessory muscles, no dullness to percussion, decreased without rales or rhonchi  Cardiovascular: Rhythm regular, heart sounds  normal, no murmurs or gallops, no peripheral edema Abdomen: soft and non-tender, no hepatosplenomegaly, BS normal. Musculoskeletal: No deformities, no cyanosis or clubbing Neuro:  alert, non focal, no tremors Skin -multiple excoriated lesions in different stages of healing        Assessment & Plan:

## 2022-06-15 NOTE — Assessment & Plan Note (Signed)
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  This is simply related to sedentary lifestyle being disabled No bed partner history is available but she does report dyspneic episodes without clear gasping or choking events.  I think it is reasonable to investigate this with a home sleep test  We will be difficult to phase shift her sleep times and I am not sure that she is interested in doing this.  I provided her with a sleep timetable sleep cycle is off . Try to shift your wake up time every week by 1/2 hour  Advise light exposure after you wake up 9 hours in bed

## 2022-06-15 NOTE — Patient Instructions (Addendum)
  X Home sleep test  Your sleep cycle is off . Try to shift your wake up time every week by 1/2 hour  Advise light exposure after you wake up 9 hours in bed  X schedule pFTs  X lung cancer screening

## 2022-06-15 NOTE — Assessment & Plan Note (Signed)
Smoking cessation was emphasized is the most important intervention that would add years to her life. She would be referred to the lung cancer screening program Surprisingly PFTs in the past has not shown any airway obstruction. She is maintained on Anoro and this seems to help.  We can continue this for now. We will reschedule PFTs to see if FEV1 has dropped

## 2022-06-20 ENCOUNTER — Ambulatory Visit (HOSPITAL_COMMUNITY)
Admission: RE | Admit: 2022-06-20 | Discharge: 2022-06-20 | Disposition: A | Discharge status: Home / Self Care | Payer: Medicare HMO | Source: Ambulatory Visit | Attending: Surgery | Admitting: Surgery

## 2022-06-20 DIAGNOSIS — N6489 Other specified disorders of breast: Secondary | ICD-10-CM | POA: Diagnosis not present

## 2022-06-20 DIAGNOSIS — R928 Other abnormal and inconclusive findings on diagnostic imaging of breast: Secondary | ICD-10-CM | POA: Diagnosis not present

## 2022-06-20 MED ORDER — LIDOCAINE HCL (PF) 2 % IJ SOLN
INTRAMUSCULAR | Status: AC
  Filled 2022-06-20: qty 10

## 2022-06-21 NOTE — Patient Instructions (Signed)
Maria Campbell  06/21/2022     @PREFPERIOPPHARMACY @   Your procedure is scheduled on  06/28/2022.   Report to 08/28/2022 at  0800 A.M.   Call this number if you have problems the morning of surgery:  [phone]   Remember:  Do not eat or drink after midnight.        Use your inhaler before you come and bring your rescue inhaler with you.      DO NOT take any medications for diabetes the morning of your procedure.     Take these medicines the morning of surgery with A SIP OF WATER                                     None.     Do not wear jewelry, make-up or nail polish.  Do not wear lotions, powders, or perfumes, or deodorant.  Do not shave 48 hours prior to surgery.  Men may shave face and neck.  Do not bring valuables to the hospital.  [facility] is not responsible for any belongings or valuables.  Contacts, dentures or bridgework may not be worn into surgery.  Leave your suitcase in the car.  After surgery it may be brought to your room.  For patients admitted to the hospital, discharge time will be determined by your treatment team.  Patients discharged the day of surgery will not be allowed to drive home and must have someone with them for 24 hours.    Special instructions:   DO NOT smoke tobacco or vape for 24 hours before your procedure.  Please read over the following fact sheets that you were given. Coughing and Deep Breathing, Surgical Site Infection Prevention, Anesthesia Post-op Instructions, and Care and Recovery After Surgery      Breast Biopsy, Care After The following information offers guidance on how to care for yourself after your breast biopsy. Your doctor may also give you more specific instructions. If you have problems or questions, contact your doctor. What can I expect after the procedure? After a breast biopsy, it is common to have: Bruising on your breast. Breast swelling. Numbness, tingling, or pain near your biopsy site.  This site is where tissue was taken out for study. Follow these instructions at home: Medicines Take over-the-counter and prescription medicines only as told by your doctor. If you were given a sedative during your procedure, do not drive or use machines until your doctor says that it is safe. A sedative is a medicine that helps you relax. Do not drink alcohol while taking pain medicine. Ask your doctor if you should avoid driving or using machines while you are taking your medicine. Biopsy site care     Follow instructions from your doctor about how to take care of your cut from surgery (incision) or your puncture site. Make sure you: Wash your hands with soap and water for at least 20 seconds before and after you change your bandage. If you cannot use soap and water, use hand sanitizer. Change your bandage. Leave stitches or skin glue in place for at least 2 weeks. Leave tape strips alone unless you are told to take them off. You may trim the edges of the tape strips if they curl up. If you have stitches, keep them dry when you take a bath or a shower. Check your cut or  puncture site every day for signs of infection. Look for: More redness, swelling, or pain. More fluid or blood. Warmth. Pus or a bad smell. Protect the biopsy site. Do not let the site get bumped. Managing pain If told, put ice on the biopsy site. To do this: Put ice in a plastic bag. Place a towel between your skin and the bag. Leave the ice on for 20 minutes, 2-3 times a day. Take off the ice if your skin turns bright red. This is very important. If you cannot feel pain, heat, or cold, you have a greater risk of damage to the area. Activity If a cut was made in your skin to do the biopsy, avoid activities that could pull your cut open. These include: Stretching. Reaching over your head. Exercise. Sports. Lifting anything that weighs more than 3 lb (1.4 kg). Return to your normal activities when your doctor says  that it is safe. General instructions Follow your normal diet. Wear a good support bra for as long as told by your doctor. Get checked for extra fluid around your lymph nodes (lymphedema) as often as told. Do not smoke or use any products that contain nicotine or tobacco. If you need help quitting, ask your doctor. Keep all follow-up visits. Contact a doctor if: You notice any of these at or near the biopsy site: More redness, swelling, or pain. More fluid or blood. Warmth. Pus or a bad smell. The site breaking open after the stitches or skin tape strips have been removed. You have a rash or a fever. Get help right away if: You have trouble breathing. You have red streaks around the biopsy site. Summary After a breast biopsy, it is common to have bruising, numbness, tingling, or pain near your biopsy site. Ask your doctor if you should avoid driving or using machines while you are taking your medicine. If you had a cut made in your skin to do the biopsy, avoid activities that may pull the cut open. Return to your normal activities when your doctor says that it is safe. Wear a good support bra for as long as told by your doctor. This information is not intended to replace advice given to you by your health care provider. Make sure you discuss any questions you have with your health care provider. Document Revised: 08/31/2021 Document Reviewed: 08/31/2021 Elsevier Patient Education  2023 [employer] General Anesthesia, Adult, Care After This sheet gives you information about how to care for yourself after your procedure. Your health care provider may also give you more specific instructions. If you have problems or questions, contact your health care provider. What can I expect after the procedure? After the procedure, the following side effects are common: Pain or discomfort at the IV site. Nausea. Vomiting. Sore throat. Trouble concentrating. Feeling cold or chills. Feeling  weak or tired. Sleepiness and fatigue. Soreness and body aches. These side effects can affect parts of the body that were not involved in surgery. Follow these instructions at home: For the time period you were told by your health care provider:  Rest. Do not participate in activities where you could fall or become injured. Do not drive or use machinery. Do not drink alcohol. Do not take sleeping pills or medicines that cause drowsiness. Do not make important decisions or sign legal documents. Do not take care of children on your own. Eating and drinking Follow any instructions from your health care provider about eating or drinking restrictions. When you feel  hungry, start by eating small amounts of foods that are soft and easy to digest (bland), such as toast. Gradually return to your regular diet. Drink enough fluid to keep your urine pale yellow. If you vomit, rehydrate by drinking water, juice, or clear broth. General instructions If you have sleep apnea, surgery and certain medicines can increase your risk for breathing problems. Follow instructions from your health care provider about wearing your sleep device: Anytime you are sleeping, including during daytime naps. While taking prescription pain medicines, sleeping medicines, or medicines that make you drowsy. Have a responsible adult stay with you for the time you are told. It is important to have someone help care for you until you are awake and alert. Return to your normal activities as told by your health care provider. Ask your health care provider what activities are safe for you. Take over-the-counter and prescription medicines only as told by your health care provider. If you smoke, do not smoke without supervision. Keep all follow-up visits as told by your health care provider. This is important. Contact a health care provider if: You have nausea or vomiting that does not get better with medicine. You cannot eat or  drink without vomiting. You have pain that does not get better with medicine. You are unable to pass urine. You develop a skin rash. You have a fever. You have redness around your IV site that gets worse. Get help right away if: You have difficulty breathing. You have chest pain. You have blood in your urine or stool, or you vomit blood. Summary After the procedure, it is common to have a sore throat or nausea. It is also common to feel tired. Have a responsible adult stay with you for the time you are told. It is important to have someone help care for you until you are awake and alert. When you feel hungry, start by eating small amounts of foods that are soft and easy to digest (bland), such as toast. Gradually return to your regular diet. Drink enough fluid to keep your urine pale yellow. Return to your normal activities as told by your health care provider. Ask your health care provider what activities are safe for you. This information is not intended to replace advice given to you by your health care provider. Make sure you discuss any questions you have with your health care provider. Document Revised: 07/22/2020 Document Reviewed: 02/19/2020 Elsevier Patient Education  2023 Elsevier Inc. How to Use Chlorhexidine for Bathing Chlorhexidine gluconate (CHG) is a germ-killing (antiseptic) solution that is used to clean the skin. It can get rid of the bacteria that normally live on the skin and can keep them away for about 24 hours. To clean your skin with CHG, you may be given: A CHG solution to use in the shower or as part of a sponge bath. A prepackaged cloth that contains CHG. Cleaning your skin with CHG may help lower the risk for infection: While you are staying in the intensive care unit of the hospital. If you have a vascular access, such as a central line, to provide short-term or long-term access to your veins. If you have a catheter to drain urine from your bladder. If you  are on a ventilator. A ventilator is a machine that helps you breathe by moving air in and out of your lungs. After surgery. What are the risks? Risks of using CHG include: A skin reaction. Hearing loss, if CHG gets in your ears and you have  a perforated eardrum. Eye injury, if CHG gets in your eyes and is not rinsed out. The CHG product catching fire. Make sure that you avoid smoking and flames after applying CHG to your skin. Do not use CHG: If you have a chlorhexidine allergy or have previously reacted to chlorhexidine. On babies younger than 28 months of age. How to use CHG solution Use CHG only as told by your health care provider, and follow the instructions on the label. Use the full amount of CHG as directed. Usually, this is one bottle. During a shower Follow these steps when using CHG solution during a shower (unless your health care provider gives you different instructions): Start the shower. Use your normal soap and shampoo to wash your face and hair. Turn off the shower or move out of the shower stream. Pour the CHG onto a clean washcloth. Do not use any type of brush or rough-edged sponge. Starting at your neck, lather your body down to your toes. Make sure you follow these instructions: If you will be having surgery, pay special attention to the part of your body where you will be having surgery. Scrub this area for at least 1 minute. Do not use CHG on your head or face. If the solution gets into your ears or eyes, rinse them well with water. Avoid your genital area. Avoid any areas of skin that have broken skin, cuts, or scrapes. Scrub your back and under your arms. Make sure to wash skin folds. Let the lather sit on your skin for 1-2 minutes or as long as told by your health care provider. Thoroughly rinse your entire body in the shower. Make sure that all body creases and crevices are rinsed well. Dry off with a clean towel. Do not put any substances on your body  afterward--such as powder, lotion, or perfume--unless you are told to do so by your health care provider. Only use lotions that are recommended by the manufacturer. Put on clean clothes or pajamas. If it is the night before your surgery, sleep in clean sheets.  During a sponge bath Follow these steps when using CHG solution during a sponge bath (unless your health care provider gives you different instructions): Use your normal soap and shampoo to wash your face and hair. Pour the CHG onto a clean washcloth. Starting at your neck, lather your body down to your toes. Make sure you follow these instructions: If you will be having surgery, pay special attention to the part of your body where you will be having surgery. Scrub this area for at least 1 minute. Do not use CHG on your head or face. If the solution gets into your ears or eyes, rinse them well with water. Avoid your genital area. Avoid any areas of skin that have broken skin, cuts, or scrapes. Scrub your back and under your arms. Make sure to wash skin folds. Let the lather sit on your skin for 1-2 minutes or as long as told by your health care provider. Using a different clean, wet washcloth, thoroughly rinse your entire body. Make sure that all body creases and crevices are rinsed well. Dry off with a clean towel. Do not put any substances on your body afterward--such as powder, lotion, or perfume--unless you are told to do so by your health care provider. Only use lotions that are recommended by the manufacturer. Put on clean clothes or pajamas. If it is the night before your surgery, sleep in clean sheets. How to use  CHG prepackaged cloths Only use CHG cloths as told by your health care provider, and follow the instructions on the label. Use the CHG cloth on clean, dry skin. Do not use the CHG cloth on your head or face unless your health care provider tells you to. When washing with the CHG cloth: Avoid your genital area. Avoid  any areas of skin that have broken skin, cuts, or scrapes. Before surgery Follow these steps when using a CHG cloth to clean before surgery (unless your health care provider gives you different instructions): Using the CHG cloth, vigorously scrub the part of your body where you will be having surgery. Scrub using a back-and-forth motion for 3 minutes. The area on your body should be completely wet with CHG when you are done scrubbing. Do not rinse. Discard the cloth and let the area air-dry. Do not put any substances on the area afterward, such as powder, lotion, or perfume. Put on clean clothes or pajamas. If it is the night before your surgery, sleep in clean sheets.  For general bathing Follow these steps when using CHG cloths for general bathing (unless your health care provider gives you different instructions). Use a separate CHG cloth for each area of your body. Make sure you wash between any folds of skin and between your fingers and toes. Wash your body in the following order, switching to a new cloth after each step: The front of your neck, shoulders, and chest. Both of your arms, under your arms, and your hands. Your stomach and groin area, avoiding the genitals. Your right leg and foot. Your left leg and foot. The back of your neck, your back, and your buttocks. Do not rinse. Discard the cloth and let the area air-dry. Do not put any substances on your body afterward--such as powder, lotion, or perfume--unless you are told to do so by your health care provider. Only use lotions that are recommended by the manufacturer. Put on clean clothes or pajamas. Contact a health care provider if: Your skin gets irritated after scrubbing. You have questions about using your solution or cloth. You swallow any chlorhexidine. Call your local poison control center ([phone] in the [place] Get help right away if: Your eyes itch badly, or they become very red or swollen. Your skin itches  badly and is red or swollen. Your hearing changes. You have trouble seeing. You have swelling or tingling in your mouth or throat. You have trouble breathing. These symptoms may represent a serious problem that is an emergency. Do not wait to see if the symptoms will go away. Get medical help right away. Call your local emergency services (911 in the [place] Do not drive yourself to the hospital. Summary Chlorhexidine gluconate (CHG) is a germ-killing (antiseptic) solution that is used to clean the skin. Cleaning your skin with CHG may help to lower your risk for infection. You may be given CHG to use for bathing. It may be in a bottle or in a prepackaged cloth to use on your skin. Carefully follow your health care provider's instructions and the instructions on the product label. Do not use CHG if you have a chlorhexidine allergy. Contact your health care provider if your skin gets irritated after scrubbing. This information is not intended to replace advice given to you by your health care provider. Make sure you discuss any questions you have with your health care provider. Document Revised: 01/17/2021 Document Reviewed: 01/17/2021 Elsevier Patient Education  2023 [employer].

## 2022-06-23 ENCOUNTER — Encounter (HOSPITAL_COMMUNITY)
Admission: RE | Admit: 2022-06-23 | Discharge: 2022-06-23 | Disposition: A | Discharge status: Home / Self Care | Payer: Medicare HMO | Source: Ambulatory Visit | Attending: Surgery | Admitting: Surgery

## 2022-06-23 ENCOUNTER — Encounter (HOSPITAL_COMMUNITY): Payer: Self-pay

## 2022-06-23 VITALS — BP 89/69 | HR 102 | Temp 97.8°F | Resp 18 | Ht 62.0 in | Wt 220.2 lb

## 2022-06-23 DIAGNOSIS — Z01812 Encounter for preprocedural laboratory examination: Secondary | ICD-10-CM | POA: Insufficient documentation

## 2022-06-23 DIAGNOSIS — Z01818 Encounter for other preprocedural examination: Secondary | ICD-10-CM

## 2022-06-23 HISTORY — DX: Unspecified osteoarthritis, unspecified site: M19.90

## 2022-06-23 HISTORY — DX: Dyspnea, unspecified: R06.00

## 2022-06-23 HISTORY — DX: Personal history of urinary calculi: Z87.442

## 2022-06-28 ENCOUNTER — Ambulatory Visit (HOSPITAL_COMMUNITY)
Admission: RE | Admit: 2022-06-28 | Discharge: 2022-06-28 | Disposition: A | Discharge status: Home / Self Care | Payer: Medicare HMO | Attending: Surgery | Admitting: Surgery

## 2022-06-28 ENCOUNTER — Other Ambulatory Visit: Payer: Self-pay

## 2022-06-28 ENCOUNTER — Ambulatory Visit (HOSPITAL_BASED_OUTPATIENT_CLINIC_OR_DEPARTMENT_OTHER): Payer: Medicare HMO

## 2022-06-28 ENCOUNTER — Ambulatory Visit (HOSPITAL_COMMUNITY): Payer: Medicare HMO

## 2022-06-28 ENCOUNTER — Encounter (HOSPITAL_COMMUNITY): Payer: Self-pay | Admitting: Surgery

## 2022-06-28 ENCOUNTER — Encounter (HOSPITAL_COMMUNITY)
Admission: RE | Disposition: A | Discharge status: Home / Self Care | Payer: Self-pay | Source: Home / Self Care | Attending: Surgery

## 2022-06-28 DIAGNOSIS — N62 Hypertrophy of breast: Secondary | ICD-10-CM | POA: Diagnosis not present

## 2022-06-28 DIAGNOSIS — N6489 Other specified disorders of breast: Secondary | ICD-10-CM | POA: Insufficient documentation

## 2022-06-28 DIAGNOSIS — G40909 Epilepsy, unspecified, not intractable, without status epilepticus: Secondary | ICD-10-CM | POA: Insufficient documentation

## 2022-06-28 DIAGNOSIS — Z8673 Personal history of transient ischemic attack (TIA), and cerebral infarction without residual deficits: Secondary | ICD-10-CM | POA: Insufficient documentation

## 2022-06-28 DIAGNOSIS — F17210 Nicotine dependence, cigarettes, uncomplicated: Secondary | ICD-10-CM | POA: Insufficient documentation

## 2022-06-28 DIAGNOSIS — E785 Hyperlipidemia, unspecified: Secondary | ICD-10-CM | POA: Diagnosis not present

## 2022-06-28 DIAGNOSIS — Z6841 Body Mass Index (BMI) 40.0 and over, adult: Secondary | ICD-10-CM | POA: Diagnosis not present

## 2022-06-28 DIAGNOSIS — K219 Gastro-esophageal reflux disease without esophagitis: Secondary | ICD-10-CM | POA: Diagnosis not present

## 2022-06-28 DIAGNOSIS — R69 Illness, unspecified: Secondary | ICD-10-CM | POA: Diagnosis not present

## 2022-06-28 DIAGNOSIS — F418 Other specified anxiety disorders: Secondary | ICD-10-CM | POA: Diagnosis not present

## 2022-06-28 DIAGNOSIS — Z87442 Personal history of urinary calculi: Secondary | ICD-10-CM | POA: Diagnosis not present

## 2022-06-28 DIAGNOSIS — N6012 Diffuse cystic mastopathy of left breast: Secondary | ICD-10-CM | POA: Diagnosis not present

## 2022-06-28 DIAGNOSIS — R569 Unspecified convulsions: Secondary | ICD-10-CM | POA: Diagnosis not present

## 2022-06-28 DIAGNOSIS — D0512 Intraductal carcinoma in situ of left breast: Secondary | ICD-10-CM

## 2022-06-28 DIAGNOSIS — N6042 Mammary duct ectasia of left breast: Secondary | ICD-10-CM | POA: Insufficient documentation

## 2022-06-28 DIAGNOSIS — R928 Other abnormal and inconclusive findings on diagnostic imaging of breast: Secondary | ICD-10-CM | POA: Diagnosis not present

## 2022-06-28 HISTORY — PX: BREAST BIOPSY WITH RADIO FREQUENCY LOCALIZER: SHX6895

## 2022-06-28 LAB — POCT PREGNANCY, URINE: Preg Test, Ur: NEGATIVE

## 2022-06-28 SURGERY — BREAST BIOPSY WITH RADIO FREQUENCY LOCALIZER
Anesthesia: General | Site: Breast | Laterality: Left

## 2022-06-28 MED ORDER — HYDROMORPHONE HCL 1 MG/ML IJ SOLN: 0.2500 mg | INTRAMUSCULAR | Status: DC | PRN

## 2022-06-28 MED ORDER — METOCLOPRAMIDE HCL 5 MG/ML IJ SOLN
INTRAMUSCULAR | Status: AC
  Filled 2022-06-28: qty 2

## 2022-06-28 MED ORDER — PHENYLEPHRINE HCL-NACL 20-0.9 MG/250ML-% IV SOLN
INTRAVENOUS | Status: AC
  Filled 2022-06-28: qty 250

## 2022-06-28 MED ORDER — LACTATED RINGERS IV SOLN: INTRAVENOUS | Status: DC | PRN

## 2022-06-28 MED ORDER — FENTANYL CITRATE (PF) 100 MCG/2ML IJ SOLN
INTRAMUSCULAR | Status: AC
  Filled 2022-06-28: qty 2

## 2022-06-28 MED ORDER — KETOROLAC TROMETHAMINE 30 MG/ML IJ SOLN
INTRAMUSCULAR | Status: AC
  Filled 2022-06-28: qty 1

## 2022-06-28 MED ORDER — OXYCODONE HCL 10 MG PO TABS
5.0000 mg | ORAL_TABLET | Freq: Four times a day (QID) | ORAL | 0 refills | Status: AC
Start: 2022-06-28 — End: 2022-07-05

## 2022-06-28 MED ORDER — PHENYLEPHRINE 80 MCG/ML (10ML) SYRINGE FOR IV PUSH (FOR BLOOD PRESSURE SUPPORT)
PREFILLED_SYRINGE | INTRAVENOUS | Status: AC
  Filled 2022-06-28: qty 10

## 2022-06-28 MED ORDER — ROCURONIUM BROMIDE 100 MG/10ML IV SOLN
INTRAVENOUS | Status: DC | PRN
  Administered 2022-06-28 (×2): 20 mg via INTRAVENOUS

## 2022-06-28 MED ORDER — ASPIRIN 81 MG PO TBEC: 81.0000 mg | DELAYED_RELEASE_TABLET | Freq: Every day | ORAL | 12 refills | Status: AC

## 2022-06-28 MED ORDER — PROPOFOL 10 MG/ML IV BOLUS
INTRAVENOUS | Status: DC | PRN
  Administered 2022-06-28: 180 mg via INTRAVENOUS

## 2022-06-28 MED ORDER — ONDANSETRON 4 MG PO TBDP
ORAL_TABLET | ORAL | Status: AC
  Filled 2022-06-28: qty 1

## 2022-06-28 MED ORDER — ONDANSETRON HCL 4 MG/2ML IJ SOLN
INTRAMUSCULAR | Status: AC
  Filled 2022-06-28: qty 2

## 2022-06-28 MED ORDER — PROPOFOL 10 MG/ML IV BOLUS
INTRAVENOUS | Status: AC
  Filled 2022-06-28: qty 20

## 2022-06-28 MED ORDER — ONDANSETRON 4 MG PO TBDP
4.0000 mg | ORAL_TABLET | Freq: Once | ORAL | Status: AC
  Administered 2022-06-28: 4 mg via ORAL

## 2022-06-28 MED ORDER — BUPIVACAINE HCL (PF) 0.5 % IJ SOLN
INTRAMUSCULAR | Status: AC
  Filled 2022-06-28: qty 30

## 2022-06-28 MED ORDER — ACETAMINOPHEN 500 MG PO TABS: 1000.0000 mg | ORAL_TABLET | Freq: Four times a day (QID) | ORAL | 0 refills | Status: AC

## 2022-06-28 MED ORDER — SODIUM CHLORIDE (PF) 0.9 % IJ SOLN
INTRAMUSCULAR | Status: AC
  Filled 2022-06-28: qty 10

## 2022-06-28 MED ORDER — EPHEDRINE SULFATE (PRESSORS) 50 MG/ML IJ SOLN
INTRAMUSCULAR | Status: DC | PRN
  Administered 2022-06-28 (×2): 10 mg via INTRAVENOUS
  Administered 2022-06-28: 5 mg via INTRAVENOUS

## 2022-06-28 MED ORDER — SUGAMMADEX SODIUM 200 MG/2ML IV SOLN
INTRAVENOUS | Status: DC | PRN
  Administered 2022-06-28: 200 mg via INTRAVENOUS

## 2022-06-28 MED ORDER — METHYLENE BLUE 1 % INJ SOLN
INTRAVENOUS | Status: AC
  Filled 2022-06-28: qty 10

## 2022-06-28 MED ORDER — CHLORHEXIDINE GLUCONATE CLOTH 2 % EX PADS: 6.0000 | MEDICATED_PAD | Freq: Once | CUTANEOUS | Status: DC

## 2022-06-28 MED ORDER — DEXAMETHASONE SODIUM PHOSPHATE 10 MG/ML IJ SOLN
INTRAMUSCULAR | Status: AC
  Filled 2022-06-28: qty 1

## 2022-06-28 MED ORDER — SUCCINYLCHOLINE CHLORIDE 200 MG/10ML IV SOSY
PREFILLED_SYRINGE | INTRAVENOUS | Status: AC
  Filled 2022-06-28: qty 10

## 2022-06-28 MED ORDER — ONDANSETRON HCL 4 MG/2ML IJ SOLN: 4.0000 mg | Freq: Once | INTRAMUSCULAR | Status: DC | PRN

## 2022-06-28 MED ORDER — SUCCINYLCHOLINE CHLORIDE 20 MG/ML IJ SOLN
INTRAMUSCULAR | Status: DC | PRN
  Administered 2022-06-28: 100 mg via INTRAVENOUS

## 2022-06-28 MED ORDER — MIDAZOLAM HCL 5 MG/5ML IJ SOLN
INTRAMUSCULAR | Status: DC | PRN
  Administered 2022-06-28: 2 mg via INTRAVENOUS

## 2022-06-28 MED ORDER — PHENYLEPHRINE HCL-NACL 20-0.9 MG/250ML-% IV SOLN
INTRAVENOUS | Status: DC | PRN
  Administered 2022-06-28: 50 ug/min via INTRAVENOUS

## 2022-06-28 MED ORDER — LIDOCAINE HCL (PF) 2 % IJ SOLN
INTRAMUSCULAR | Status: AC
  Filled 2022-06-28: qty 5

## 2022-06-28 MED ORDER — BUPIVACAINE HCL (PF) 0.5 % IJ SOLN
INTRAMUSCULAR | Status: DC | PRN
  Administered 2022-06-28: 30 mL

## 2022-06-28 MED ORDER — DEXAMETHASONE SODIUM PHOSPHATE 4 MG/ML IJ SOLN
INTRAMUSCULAR | Status: DC | PRN
  Administered 2022-06-28: 4 mg via INTRAVENOUS

## 2022-06-28 MED ORDER — ROCURONIUM BROMIDE 10 MG/ML (PF) SYRINGE
PREFILLED_SYRINGE | INTRAVENOUS | Status: AC
  Filled 2022-06-28: qty 10

## 2022-06-28 MED ORDER — ONDANSETRON HCL 4 MG/2ML IJ SOLN
INTRAMUSCULAR | Status: DC | PRN
  Administered 2022-06-28: 4 mg via INTRAVENOUS

## 2022-06-28 MED ORDER — OXYCODONE HCL 5 MG PO TABS: 5.0000 mg | ORAL_TABLET | Freq: Four times a day (QID) | ORAL | 0 refills | Status: AC | PRN

## 2022-06-28 MED ORDER — METOCLOPRAMIDE HCL 5 MG/ML IJ SOLN
10.0000 mg | Freq: Once | INTRAMUSCULAR | Status: AC
  Administered 2022-06-28: 10 mg via INTRAVENOUS

## 2022-06-28 MED ORDER — PHENYLEPHRINE HCL (PRESSORS) 10 MG/ML IV SOLN
INTRAVENOUS | Status: DC | PRN
  Administered 2022-06-28 (×2): 160 ug via INTRAVENOUS
  Administered 2022-06-28: 80 ug via INTRAVENOUS
  Administered 2022-06-28: 240 ug via INTRAVENOUS
  Administered 2022-06-28: 160 ug via INTRAVENOUS

## 2022-06-28 MED ORDER — LIDOCAINE HCL (CARDIAC) PF 100 MG/5ML IV SOSY
PREFILLED_SYRINGE | INTRAVENOUS | Status: DC | PRN
  Administered 2022-06-28: 50 mg via INTRAVENOUS

## 2022-06-28 MED ORDER — SODIUM CHLORIDE 0.9 % IR SOLN
Status: DC | PRN
  Administered 2022-06-28: 1000 mL

## 2022-06-28 MED ORDER — MIDAZOLAM HCL 2 MG/2ML IJ SOLN
INTRAMUSCULAR | Status: AC
  Filled 2022-06-28: qty 2

## 2022-06-28 MED ORDER — DOCUSATE SODIUM 100 MG PO CAPS: 100.0000 mg | ORAL_CAPSULE | Freq: Two times a day (BID) | ORAL | 2 refills | Status: DC

## 2022-06-28 MED ORDER — EPHEDRINE 5 MG/ML INJ
INTRAVENOUS | Status: AC
  Filled 2022-06-28: qty 5

## 2022-06-28 MED ORDER — CEFAZOLIN SODIUM-DEXTROSE 2-4 GM/100ML-% IV SOLN
2.0000 g | INTRAVENOUS | Status: AC
  Administered 2022-06-28: 2 g via INTRAVENOUS
  Filled 2022-06-28: qty 100

## 2022-06-28 MED ORDER — FENTANYL CITRATE (PF) 100 MCG/2ML IJ SOLN
INTRAMUSCULAR | Status: DC | PRN
  Administered 2022-06-28 (×2): 50 ug via INTRAVENOUS

## 2022-06-28 SURGICAL SUPPLY — 37 items
ADH SKN CLS APL DERMABOND .7 (GAUZE/BANDAGES/DRESSINGS) ×1
APL PRP STRL LF DISP 70% ISPRP (MISCELLANEOUS) ×1
BINDER BREAST XLRG (GAUZE/BANDAGES/DRESSINGS) ×1 IMPLANT
BLADE SURG 15 STRL LF DISP TIS (BLADE) ×1 IMPLANT
BLADE SURG 15 STRL SS (BLADE) ×2
CHLORAPREP W/TINT 26 (MISCELLANEOUS) ×2 IMPLANT
CLOTH BEACON ORANGE TIMEOUT ST (SAFETY) ×2 IMPLANT
COVER LIGHT HANDLE STERIS (MISCELLANEOUS) ×4 IMPLANT
DERMABOND ADVANCED (GAUZE/BANDAGES/DRESSINGS) ×1
DERMABOND ADVANCED .7 DNX12 (GAUZE/BANDAGES/DRESSINGS) IMPLANT
DEVICE DUBIN W/COMP PLATE 8390 (MISCELLANEOUS) ×2 IMPLANT
DRAPE HALF SHEET 40X57 (DRAPES) ×1 IMPLANT
ELECT REM PT RETURN 9FT ADLT (ELECTROSURGICAL) ×2
ELECTRODE REM PT RTRN 9FT ADLT (ELECTROSURGICAL) ×1 IMPLANT
GAUZE 4X4 16PLY ~~LOC~~+RFID DBL (SPONGE) ×1 IMPLANT
GLOVE BIOGEL PI IND STRL 6.5 (GLOVE) ×1 IMPLANT
GLOVE BIOGEL PI IND STRL 7.0 (GLOVE) ×2 IMPLANT
GLOVE BIOGEL PI INDICATOR 6.5 (GLOVE) ×1
GLOVE BIOGEL PI INDICATOR 7.0 (GLOVE) ×2
GLOVE SURG SS PI 6.5 STRL IVOR (GLOVE) ×5 IMPLANT
GOWN STRL REUS W/TWL LRG LVL3 (GOWN DISPOSABLE) ×5 IMPLANT
KIT MARKER MARGIN INK (KITS) ×1 IMPLANT
KIT TURNOVER KIT A (KITS) ×2 IMPLANT
MANIFOLD NEPTUNE II (INSTRUMENTS) ×2 IMPLANT
NDL HYPO 25X1 1.5 SAFETY (NEEDLE) ×1 IMPLANT
NEEDLE HYPO 25X1 1.5 SAFETY (NEEDLE) ×2 IMPLANT
NS IRRIG 1000ML POUR BTL (IV SOLUTION) ×2 IMPLANT
PACK MINOR (CUSTOM PROCEDURE TRAY) ×2 IMPLANT
PAD ABD 5X9 TENDERSORB (GAUZE/BANDAGES/DRESSINGS) ×1 IMPLANT
PAD ARMBOARD 7.5X6 YLW CONV (MISCELLANEOUS) ×2 IMPLANT
SET BASIN LINEN APH (SET/KITS/TRAYS/PACK) ×2 IMPLANT
SET LOCALIZER 20 PROBE US (MISCELLANEOUS) ×3 IMPLANT
SUT MNCRL AB 4-0 PS2 18 (SUTURE) ×2 IMPLANT
SUT SILK 2 0 SH (SUTURE) ×2 IMPLANT
SUT VIC AB 3-0 SH 27 (SUTURE) ×6
SUT VIC AB 3-0 SH 27X BRD (SUTURE) ×1 IMPLANT
SYR CONTROL 10ML LL (SYRINGE) ×2 IMPLANT

## 2022-06-28 NOTE — Anesthesia Postprocedure Evaluation (Signed)
Anesthesia Post Note  Patient: Maria Campbell  Procedure(s) Performed: BREAST BIOPSY WITH RADIO FREQUENCY LOCALIZER (Left: Breast)  Patient location during evaluation: PACU Anesthesia Type: General Level of consciousness: awake and alert and oriented Pain management: pain level controlled Vital Signs Assessment: post-procedure vital signs reviewed and stable Respiratory status: spontaneous breathing, nonlabored ventilation and respiratory function stable Cardiovascular status: blood pressure returned to baseline and stable Postop Assessment: adequate PO intake and able to ambulate (mild nausea, ondansetron sublingual given, nausea improved) Anesthetic complications: no   No notable events documented.   Last Vitals:  Vitals:   06/28/22 1315 06/28/22 1327  BP: 101/89 105/86  Pulse: 84 85  Resp:  16  Temp:    SpO2: 95% 97%    Last Pain:  Vitals:   06/28/22 1327  PainSc: 3                  Jene Oravec C Kaoru Benda

## 2022-06-28 NOTE — Discharge Instructions (Signed)
Ambulatory Surgery Discharge Instructions  General Anesthesia or Sedation Do not drive or operate heavy machinery for 24 hours.  Do not consume alcohol, tranquilizers, sleeping medications, or any non-prescribed medications for 24 hours. Do not make important decisions or sign any important papers in the next 24 hours. You should have someone with you tonight at home.  Activity  You are advised to go directly home from the hospital.  Restrict your activities and rest for a day.  Resume light activity tomorrow. No heavy lifting over 10 lbs or strenuous exercise.  Fluids and Diet Begin with clear liquids, bouillon, dry toast, soda crackers.  If not nauseated, you may go to a regular diet when you desire.  Greasy and spicy foods are not advised.  Medications  If you have not had a bowel movement in 24 hours, take 2 tablespoons over the counter Milk of mag.             You May resume your blood thinners tomorrow (Aspirin, coumadin, or other).  You are being discharged with prescriptions for Opioid/Narcotic Medications: There are some specific considerations for these medications that you should know. Opioid Meds have risks & benefits. Addiction to these meds is always a concern with prolonged use Take medication only as directed Do not drive while taking narcotic pain medication Do not crush tablets or capsules Do not use a different container than medication was dispensed in Lock the container of medication in a cool, dry place out of reach of children and pets. Opioid medication can cause addiction Do not share with anyone else (this is a felony) Do not store medications for future use. Dispose of them properly.     Disposal:  Find a [employer] household drug take back site near you.  If you can't get to a drug take back site, use the recipe below as a last resort to dispose of expired, unused or unwanted drugs. Disposal  (Do not dispose chemotherapy drugs this way, talk to your  prescribing doctor instead.) Step 1: Mix drugs (do not crush) with dirt, kitty litter, or used coffee grounds and add a small amount of water to dissolve any solid medications. Step 2: Seal drugs in plastic bag. Step 3: Place plastic bag in trash. Step 4: Take prescription container and scratch out personal information, then recycle or throw away.  Operative Site  DO NOT PICK AT YOUR INCISION!              DO NOT LET YOUR CATS CLIMB ON YOUR BODY IN THE POST-OPERATIVE PERIOD.              You have a liquid bandage over your incisions, this will begin to flake off in about a week. Ok to English as a second language teacher. Keep wound clean and dry. No baths or swimming. No lifting more than 10 pounds.  Contact Information: If you have questions or concerns, please call our office, [phone], Monday- Thursday 8AM-5PM and Friday 8AM-12Noon.  If it is after hours or on the weekend, please call [facility], [phone], and ask to speak to the surgeon on call for Dr. Robyne Peers at [facility].   SPECIFIC COMPLICATIONS TO WATCH FOR: Inability to urinate Fever over 101? F by mouth Nausea and vomiting lasting longer than 24 hours. Pain not relieved by medication ordered Swelling around the operative site Increased redness, warmth, hardness, around operative area Numbness, tingling, or cold fingers or toes Blood -soaked dressing, (small amounts of oozing may be normal) Increasing  and progressive drainage from surgical area or exam site

## 2022-06-28 NOTE — Transfer of Care (Signed)
Immediate Anesthesia Transfer of Care Note  Patient: Maria Campbell  Procedure(s) Performed: BREAST BIOPSY WITH RADIO FREQUENCY LOCALIZER (Left: Breast)  Patient Location: PACU  Anesthesia Type:General  Level of Consciousness: drowsy  Airway & Oxygen Therapy: Patient Spontanous Breathing and Patient connected to face mask  Post-op Assessment: Report given to RN and Post -op Vital signs reviewed and stable  Post vital signs: Reviewed and stable  Last Vitals:  Vitals Value Taken Time  BP 129/74 06/28/22 1240  Temp 97.8   Pulse 93 06/28/22 1243  Resp 15 06/28/22 1243  SpO2 96 % 06/28/22 1243  Vitals shown include unvalidated device data.  Last Pain: There were no vitals filed for this visit.       Complications: No notable events documented.

## 2022-06-28 NOTE — Interval H&P Note (Signed)
History and Physical Interval Note:  06/28/2022 9:29 AM  Maria Campbell  has presented today for surgery, with the diagnosis of COMPLEX SCLEROSING BREAST LESION, LEFT.  The various methods of treatment have been discussed with the patient and family. After consideration of risks, benefits and other options for treatment, the patient has consented to  Procedure(s) with comments: BREAST BIOPSY WITH RADIO FREQUENCY LOCALIZER (Left) - pt requests start time 10:00 as a surgical intervention.  The patient's history has been reviewed, patient examined, no change in status, stable for surgery.  I have reviewed the patient's chart and labs.  Questions were answered to the patient's satisfaction.     Chanel Mcadams A Arran Fessel

## 2022-06-28 NOTE — Progress Notes (Signed)
Update Note:  Spoke with the patient in PACU.  I explained that her surgery went well, and we were able to remove the area of concern in addition to the Faxitron clip and the biopsy clip.  I will call her with the pathology results once they return.  She has been discharged home with prescription for narcotic pain medication, which she should take as needed for pain.  If she is taking this, she should take a stool softener.  I also recommended that she take scheduled Tylenol around-the-clock for the next few days.  She has dissolvable stitches under the skin and overlying Dermabond, which will flake off in 10 to 14 days.  I stressed to her the importance of not picking at her surgical site.  I also stressed the importance of her cats not getting on her body, to decrease the risk of infection.  I also updated the patient's ride, Maria Campbell over the phone.  All questions were answered to their expressed satisfaction.  Theophilus Kinds, DO [facility] [address] [place], [place] [address] [phone] (office)

## 2022-06-28 NOTE — Op Note (Signed)
[facility] Operative Note  06/28/22  Preoperative Diagnosis: Complex sclerosing lesion of the left breast   Postoperative Diagnosis: Same   Procedure(s) Performed: Excisional biopsy of left breast lesion with the use of radiofrequency localizer   Surgeon: Theophilus Kinds, DO    Assistants: No qualified resident was available    Anesthesia: General endotracheal   Anesthesiologist: Molli Barrows, MD    Specimens: Left breast mass, labeled with painting system; medial margin; posterior/inferior margin   Estimated Blood Loss: Minimal   Blood Replacement: None    Complications: None   Wound Class: Clean   Operative Indications: Patient is a 56 year old female who presents for excisional biopsy of left breast lesion.  She underwent bilateral screening mammogram which demonstrated a lesion in her left breast, which was subsequently biopsied and demonstrated to be a complex sclerosing lesion.  She was referred to general surgery for excisional biopsy of this area.  She is agreeable to surgery.  All risks and benefits of performing this procedure were discussed with the patient including pain, infection, bleeding, damage to the surrounding structures, and need for more procedures or surgery. The patient voiced understanding of the procedure, all questions were sought and answered, and consent was obtained.  Findings: Left breast mass removed with both Faxitron clip and biopsy clip within specimen, noted to be within 1 cm posterior margin, so additional margins were removed and sent to pathology   Procedure: The patient was brought to the operating room and the site of surgery, left breast, was confirmed. General anesthesia was induced. Preoperative localization was performed by the department of radiology via a Faxitron clip.  The breast, chest wall, axilla, and upper arm and neck were prepped and draped in the usual sterile fashion.   The Faxitron probe was  used to help determine the best location for incision, and a curvilinear incision was made and flaps were raised.  The Faxitron probe was placed within the depths of the wounds and upon dissection, the clip was identified.  A ball of tissue was taken surrounding this area.  The probe was then used to demonstrate that the clip was removed with the specimen.  The specimen was oriented using our painting system. This specimen was first sent to radiology, and the Faxitron clip and biopsy clip were verified to be within the specimen. It was then sent to pathology for evaluation.  The clip was noted to be within 1 cm of the posterior margin, so additional margins were obtained (medial and posterior/inferior), and these were also sent to pathology for evaluation. Electrocautery was used to achieve hemostasis.  The wound was irrigated with normal saline.  Local anesthetic was instilled.  Again hemostasis was verified.  Deep 3-0 Vicryl was used to close the subcutaneous tissue in an interrupted fashion, and 4-0 Monocryl used to close the skin in a subcuticular fashion.  Dermabond was applied, and an ABD pad with a compression bra were then placed once this had dried.  Final inspection revealed acceptable hemostasis. All counts were correct at the end of the case. The patient was awakened from anesthesia and extubated without complication.  The patient went to the PACU in stable condition.   Theophilus Kinds, DO  [facility] [address] [place], [place] [address] [phone] (office)

## 2022-06-28 NOTE — Anesthesia Procedure Notes (Signed)
Procedure Name: Intubation Date/Time: 06/28/2022 10:13 AM  Performed by: Alphonse Guild, RNPre-anesthesia Checklist: Patient identified, Emergency Drugs available, Suction available and Patient being monitored Patient Re-evaluated:Patient Re-evaluated prior to induction Oxygen Delivery Method: Circle system utilized Preoxygenation: Pre-oxygenation with 100% oxygen Induction Type: IV induction, Cricoid Pressure applied and Rapid sequence Laryngoscope Size: Miller and 2 Grade View: Grade I Tube type: Oral Tube size: 7.0 mm Number of attempts: 1 Airway Equipment and Method: Stylet Placement Confirmation: ETT inserted through vocal cords under direct vision, positive ETCO2 and breath sounds checked- equal and bilateral Secured at: 21 cm Tube secured with: Tape Dental Injury: Teeth and Oropharynx as per pre-operative assessment

## 2022-06-28 NOTE — Anesthesia Preprocedure Evaluation (Addendum)
Anesthesia Evaluation  Patient identified by MRN, date of birth, ID band Patient awake    Reviewed: Allergy & Precautions, NPO status , Patient's Chart, lab work & pertinent test results  Airway Mallampati: II  TM Distance: >3 FB Neck ROM: Full    Dental  (+) Edentulous Upper, Edentulous Lower   Pulmonary shortness of breath and with exertion, Current Smoker and Patient abstained from smoking.,    Pulmonary exam normal breath sounds clear to auscultation       Cardiovascular Exercise Tolerance: Poor Normal cardiovascular exam Rhythm:Regular Rate:Normal     Neuro/Psych  Headaches, Seizures -,  PSYCHIATRIC DISORDERS Anxiety Depression CVA, No Residual Symptoms    GI/Hepatic Neg liver ROS, GERD  Medicated and Poorly Controlled,  Endo/Other  Morbid obesity  Renal/GU Renal InsufficiencyRenal disease  negative genitourinary   Musculoskeletal  (+) Arthritis , Osteoarthritis,    Abdominal   Peds negative pediatric ROS (+)  Hematology negative hematology ROS (+)   Anesthesia Other Findings Memory loss  Reproductive/Obstetrics negative OB ROS                           Anesthesia Physical Anesthesia Plan  ASA: 3  Anesthesia Plan: General   Post-op Pain Management: Dilaudid IV   Induction: Intravenous and Rapid sequence  PONV Risk Score and Plan: Ondansetron, Dexamethasone and Metaclopromide  Airway Management Planned: Oral ETT  Additional Equipment:   Intra-op Plan:   Post-operative Plan: Extubation in OR  Informed Consent: I have reviewed the patients History and Physical, chart, labs and discussed the procedure including the risks, benefits and alternatives for the proposed anesthesia with the patient or authorized representative who has indicated his/her understanding and acceptance.     Dental advisory given  Plan Discussed with: CRNA and Surgeon  Anesthesia Plan Comments:          Anesthesia Quick Evaluation

## 2022-06-29 ENCOUNTER — Encounter (HOSPITAL_COMMUNITY): Payer: Self-pay | Admitting: Surgery

## 2022-06-30 LAB — SURGICAL PATHOLOGY

## 2022-07-04 ENCOUNTER — Other Ambulatory Visit: Payer: Self-pay | Admitting: Family Medicine

## 2022-07-04 DIAGNOSIS — R0602 Shortness of breath: Secondary | ICD-10-CM

## 2022-07-05 ENCOUNTER — Telehealth (INDEPENDENT_AMBULATORY_CARE_PROVIDER_SITE_OTHER): Payer: Medicare HMO | Admitting: Surgery

## 2022-07-05 DIAGNOSIS — N6489 Other specified disorders of breast: Secondary | ICD-10-CM

## 2022-07-05 NOTE — Telephone Encounter (Signed)
Called to update the patient to update her on her pathology results.  I explained that there was no concerning lesions and no cancer noted within the specimen.  I also advised her of her follow-up visit on 8/23 at 2:15 PM.  She states that she has been doing well and not letting her cats near her breast.  All of her questions were answered to her expressed satisfaction.  Pathology: A. BREAST, LEFT, BIOPSY:  Radial sclerosing lesion with biopsy site changes.  Fibrocystic changes with usual epithelial hyperplasia.  Negative for ADH, ALH, DCIS and invasive carcinoma.  The margins of resection are normal.   B. BREAST, MEDIAL, LEFT, BIOPSY:  Normal adipose tissue and breast tissue with focal duct ectasia.  Negative for ADH, ALH, DCIS and invasive carcinoma.   C. BREAST, POSTERIOR, INFERIOR, LEFT, BIOPSY:  Two benign lymph nodes in a segment of adipose tissue.  Mammary ducts and lobules are not identified.  Negative for malignancy.   Theophilus Kinds, DO [facility] [address] [place], [place] [address] [phone] (office)

## 2022-07-12 ENCOUNTER — Encounter: Payer: Self-pay | Admitting: Surgery

## 2022-07-12 ENCOUNTER — Ambulatory Visit (INDEPENDENT_AMBULATORY_CARE_PROVIDER_SITE_OTHER): Payer: Medicare HMO | Admitting: Surgery

## 2022-07-12 VITALS — BP 96/72 | HR 92 | Temp 98.1°F | Resp 18 | Ht 62.0 in | Wt 215.0 lb

## 2022-07-12 DIAGNOSIS — Z09 Encounter for follow-up examination after completed treatment for conditions other than malignant neoplasm: Secondary | ICD-10-CM

## 2022-07-12 NOTE — Progress Notes (Signed)
[facility] Note   HPI:  56 y.o. Female presents to clinic for post-op follow-up status post left breast lumpectomy on 8/9.  She states that she has been doing well since surgery.  She only occasionally has minimal tenderness at the incision site.  She denies any erythema, drainage, or issues with her incision site.  She is keeping her cats away from her incision site.  She denies fevers and chills.  She has no other complaints at this time.  Review of Systems:  All other review of systems: otherwise negative   Vital Signs:  BP 96/72   Pulse 92   Temp 98.1 F (36.7 C) (Oral)   Resp 18   Ht 5\' 2"  (1.575 m)   Wt 215 lb (97.5 kg)   LMP  (LMP Unknown)   SpO2 93%   BMI 39.32 kg/m    Physical Exam:  Physical Exam Vitals reviewed.  Constitutional:      Appearance: Normal appearance.  Chest:     Comments: Left breast incision site healing well with overlying Dermabond in place, slowly starting to flake off; no associated induration or erythema Neurological:     Mental Status: She is alert.    Laboratory studies: None  Imaging:  None  Pathology: A. BREAST, LEFT, BIOPSY:  Radial sclerosing lesion with biopsy site changes.  Fibrocystic changes with usual epithelial hyperplasia.  Negative for ADH, ALH, DCIS and invasive carcinoma.  The margins of resection are normal.   B. BREAST, MEDIAL, LEFT, BIOPSY:  Normal adipose tissue and breast tissue with focal duct ectasia.  Negative for ADH, ALH, DCIS and invasive carcinoma.   C. BREAST, POSTERIOR, INFERIOR, LEFT, BIOPSY:  Two benign lymph nodes in a segment of adipose tissue.  Mammary ducts and lobules are not identified.  Negative for malignancy.   Assessment:  56 y.o. yo Female who presents for follow-up status post left breast lumpectomy on 8/9.  Plan:  -I advised her that her pathology is again reassuring, and she can return to her regular normal screening mammogram schedule -Continue to keep her cats  away from her incision site for at least an additional 2 weeks until it is fully healed -She may use antibiotic ointment to help dissolve the remaining skin glue -Follow up as needed  All of the above recommendations were discussed with the patient, and all of patient's questions were answered to her expressed satisfaction.  10/9, DO [facility] [address] [place] [address] [place], [place] [phone] (office)

## 2022-07-31 ENCOUNTER — Ambulatory Visit (INDEPENDENT_AMBULATORY_CARE_PROVIDER_SITE_OTHER): Payer: Medicare HMO | Admitting: *Deleted

## 2022-07-31 VITALS — BP 111/83 | HR 92 | Ht 62.0 in | Wt 216.8 lb

## 2022-07-31 DIAGNOSIS — Z0000 Encounter for general adult medical examination without abnormal findings: Secondary | ICD-10-CM

## 2022-07-31 NOTE — Patient Instructions (Addendum)
  MEDICARE ANNUAL WELLNESS VISIT Health Maintenance Summary and Written Plan of Care  Ms. Burley Saver ,  Thank you for allowing me to perform your Medicare Annual Wellness Visit and for your ongoing commitment to your health.   Health Maintenance & Immunization History Health Maintenance  Topic Date Due   PAP SMEAR-Modifier  Never done   COLONOSCOPY (Pts 45-32yrs Insurance coverage will need to be confirmed)  Never done   Zoster Vaccines- Shingrix (2 of 2) 03/01/2022   COVID-19 Vaccine (1) 08/16/2022 (Originally 04/29/1971)   TETANUS/TDAP  01/04/2023 (Originally 04/29/1984)   INFLUENZA VACCINE  02/18/2023 (Originally 06/20/2022)   MAMMOGRAM  04/18/2024   Hepatitis C Screening  Completed   HIV Screening  Completed   HPV VACCINES  Aged Out   Immunization History  Administered Date(s) Administered   Zoster Recombinat (Shingrix) 01/04/2022    These are the patient goals that we discussed:  Goals Addressed             This Visit's Progress    AWV       07/31/2022 AWV Goal: Fall Prevention  Over the next year, patient will decrease their risk for falls by: Using assistive devices, such as a cane or walker, as needed Identifying fall risks within their home and correcting them by: Removing throw rugs Adding handrails to stairs or ramps Removing clutter and keeping a clear pathway throughout the home Increasing light, especially at night Adding shower handles/bars Raising toilet seat Identifying potential personal risk factors for falls: Medication side effects Incontinence/urgency Vestibular dysfunction Hearing loss Musculoskeletal disorders Neurological disorders Orthostatic hypotension           This is a list of Health Maintenance Items that are overdue or due now: Health Maintenance Due  Topic Date Due   PAP SMEAR-Modifier  Never done   COLONOSCOPY (Pts 45-32yrs Insurance coverage will need to be confirmed)  Never done   Zoster Vaccines- Shingrix (2 of 2)  03/01/2022     Orders/Referrals Placed Today: No orders of the defined types were placed in this encounter.  (Contact our referral department at [phone] if you have not spoken with someone about your referral appointment within the next 5 days)    Follow-up Plan Follow-up with Gwenlyn Fudge, FNP as planned Schedule pap smear, colonoscopy, and 2nd shingles vaccine.

## 2022-07-31 NOTE — Progress Notes (Signed)
MEDICARE ANNUAL WELLNESS VISIT  07/31/2022  Subjective:  Maria Campbell is a 56 y.o. female patient of Loman Brooklyn, FNP who had a Medicare Annual Wellness Visit today. Maria Campbell is Disabled and lives alone. she has 1 son. she reports that she is socially active and does interact with friends/family regularly. she is minimally physically active and enjoys being outside, cooking on the grill, playing pool, and spending time with nature.  Patient Care Team: Loman Brooklyn, FNP as PCP - General (Family Medicine) Alyson Ingles Candee Furbish, MD as Consulting Physician (Urology) Harlen Labs, MD as Referring Physician (Optometry)     07/31/2022    1:29 PM 06/28/2022    8:07 AM 06/23/2022   11:07 AM 04/25/2022   10:01 AM 07/28/2021    1:19 PM 05/05/2021   11:14 AM 05/04/2021   11:04 AM  Advanced Directives  Does Patient Have a Medical Advance Directive? No No No No No No No  Would patient like information on creating a medical advance directive? No - Patient declined  No - Patient declined  No - Patient declined No - Patient declined No - Patient declined    Hospital Utilization Over the Past 12 Months: # of hospitalizations or ER visits: 0 # of surgeries: 1  Review of Systems    Patient reports that her overall health is better when compared to last year.  Review of Systems: Breast ROS: positive for - left lump removal on  06/28/22  All other systems negative.  Pain Assessment Pain : No/denies pain     Current Medications & Allergies (verified) Allergies as of 07/31/2022       Reactions   Meperidine Hives, Nausea And Vomiting   Fainting and n/v (Demerol)   Other    Bleach-chest tightness/nausea & vomiting/ felt as if I can't breathe   Bee Venom    Dakin's [sodium Hypochlorite] Nausea And Vomiting   Acitve ingredient within bleach        Medication List        Accurate as of July 31, 2022  1:39 PM. If you have any questions, ask your nurse or doctor.           albuterol 108 (90 Base) MCG/ACT inhaler Commonly known as: VENTOLIN HFA Inhale 2 puffs into the lungs every 6 (six) hours as needed.   Anoro Ellipta 62.5-25 MCG/ACT Aepb Generic drug: umeclidinium-vilanterol INHALE 1 PUFF INTO THE LUNGS DAILY AT 6 AM.   aspirin EC 81 MG tablet Take 1 tablet (81 mg total) by mouth at bedtime.   atorvastatin 20 MG tablet Commonly known as: LIPITOR TAKE 1 TABLET ONCE DAILY. What changed: when to take this   docusate sodium 100 MG capsule Commonly known as: Colace Take 1 capsule (100 mg total) by mouth 2 (two) times daily.   Farxiga 10 MG Tabs tablet Generic drug: dapagliflozin propanediol TAKE 1 TABLET DAILY BEFORE BREAKFAST. What changed:  how much to take when to take this   gabapentin 300 MG capsule Commonly known as: NEURONTIN TAKE 1 CAPSULE DAILY. What changed: when to take this   lamoTRIgine 200 MG tablet Commonly known as: LAMICTAL TAKE 1 TABLET ONCE DAILY. What changed: when to take this   nortriptyline 50 MG capsule Commonly known as: PAMELOR TAKE 2 CAPSULES IN THE EVENING. What changed: See the new instructions.   omeprazole 40 MG capsule Commonly known as: PRILOSEC Take 1 capsule (40 mg total) by mouth daily. What changed: when to  take this        History (reviewed): Past Medical History:  Diagnosis Date   Arthritis    Barrett's esophagus    CKD (chronic kidney disease) stage 3, GFR 30-59 ml/min (HCC)    Dyspnea    GERD (gastroesophageal reflux disease)    History of kidney stones    Hyperlipidemia    Kidney stones    Lung mass    PET negative on 05/22/18   Seizures (HCC)    Stroke (HCC)    Vitamin D deficiency    Past Surgical History:  Procedure Laterality Date   BREAST BIOPSY WITH RADIO FREQUENCY LOCALIZER Left 06/28/2022   Procedure: BREAST BIOPSY WITH RADIO FREQUENCY LOCALIZER;  Surgeon: Graciella Freer A, DO;  Location: [facility];  Service: General;  Laterality: Left;   CYSTOSCOPY W/ URETERAL  STENT PLACEMENT  05/05/2021   Procedure: CYSTOSCOPY WITH LEFT URETERAL STENT REPLACEMENT;  Surgeon: Cleon Gustin, MD;  Location: [facility];  Service: Urology;;   [place], URETEROSCOPY AND STENT PLACEMENT Left 05/05/2021   Procedure: CYSTOSCOPY WITH BILATERAL RETROGRADE PYELOGRAM, BILATERAL URETEROSCOPY,  RIGHT URETERAL [place];  Surgeon: Cleon Gustin, MD;  Location: [facility];  Service: Urology;  Laterality: Left;   HIP SURGERY Left    HOLMIUM LASER APPLICATION Left [id number]   Procedure: HOLMIUM LASER LITHOTRIPSY RIGHT URETERAL CALCULUS;  Surgeon: Cleon Gustin, MD;  Location: [facility];  Service: Urology;  Laterality: Left;   KNEE SURGERY Left    STONE EXTRACTION WITH BASKET  05/05/2021   Procedure: LEFT URETERAL STONE EXTRACTION WITH BASKET;  Surgeon: Cleon Gustin, MD;  Location: [facility];  Service: Urology;;   Family History  Problem Relation Age of Onset   Heart disease Mother    Heart attack Mother    Heart disease Father    Heart failure Father    Heart disease Maternal Grandmother    Heart disease Maternal Grandfather    Diabetes Paternal Grandmother    Breast cancer Neg Hx    Social History   Socioeconomic History   Marital status: Single    Spouse name: Not on file   Number of children: 1   Years of education: Not on file   Highest education level: 8th grade  Occupational History   Occupation: disability due to seizures and strokes  Tobacco Use   Smoking status: Every Day    Packs/day: 1.00    Years: 35.00    Total pack years: 35.00    Types: Cigarettes   Smokeless tobacco: Never  Vaping Use   Vaping Use: Never used  Substance and Sexual Activity   Alcohol use: Never   Drug use: Never   Sexual activity: Not Currently    Birth control/protection: None  Other Topics Concern   Not on file  Social History Narrative   Lives with a roommate - has one son - he lives hours away   Social Determinants of Health    Financial Resource Strain: Low Risk  (07/28/2021)   Overall Financial Resource Strain (CARDIA)    Difficulty of Paying Living Expenses: Not hard at all  Food Insecurity: No Food Insecurity (07/28/2021)   Hunger Vital Sign    Worried About Running Out of Food in the Last Year: Never true    Ran Out of Food in the Last Year: Never true  Transportation Needs: No Transportation Needs (07/28/2021)   PRAPARE - Transportation    Lack of Transportation (Medical): No    Lack  of Transportation (Non-Medical): No  Physical Activity: Insufficiently Active (07/28/2021)   Exercise Vital Sign    Days of Exercise per Week: 3 days    Minutes of Exercise per Session: 30 min  Stress: No Stress Concern Present (07/28/2021)   [employer] of Occupational Health - Occupational Stress Questionnaire    Feeling of Stress : Only a little  Social Connections: Moderately Isolated (07/28/2021)   Social Connection and Isolation Panel [NHANES]    Frequency of Communication with Friends and Family: More than three times a week    Frequency of Social Gatherings with Friends and Family: More than three times a week    Attends Religious Services: Never    Database administrator or Organizations: No    Attends Banker Meetings: Never    Marital Status: Living with partner    Activities of Daily Living    07/31/2022    1:29 PM 06/28/2022    8:15 AM  In your present state of health, do you have any difficulty performing the following activities:  Hearing? 1 0  Comment At times   Vision? 0 0  Comment Wears glasses   Difficulty concentrating or making decisions? 1 0  Comment Remembering at times   Walking or climbing stairs? 0 0  Dressing or bathing? 0 0  Doing errands, shopping? 0   Preparing Food and eating ? N   Using the Toilet? N   In the past six months, have you accidently leaked urine? N   Do you have problems with loss of bowel control? N   Managing your Medications? N   Managing your  Finances? N   Housekeeping or managing your Housekeeping? Y   Comment able to manage but it does get hard at times     Patient Education/Literacy How often do you need to have someone help you when you read instructions, pamphlets, or other written materials from your doctor or pharmacy?: 1 - Never What is the last grade level you completed in school?: 8th grade  Exercise Current Exercise Habits: Home exercise routine, Type of exercise: Other - see comments (stationary bicycle), Frequency (Times/Week): 1, Intensity: Mild, Exercise limited by: orthopedic condition(s)  Diet Patient reports consuming 1 meals a day and 2 snack(s) a day Patient reports that her primary diet is: Regular Patient reports that she does have regular access to food.   Depression Screen    07/31/2022    1:32 PM 04/18/2022   11:25 AM 01/04/2022    9:59 AM 09/14/2021    8:35 AM 07/28/2021    1:13 PM 03/17/2021    2:42 PM 10/08/2020    8:29 AM  PHQ 2/9 Scores  PHQ - 2 Score 0 0 0 2 0 1 0  PHQ- 9 Score    8  9      Fall Risk    07/31/2022    1:29 PM 06/06/2022   11:15 AM 04/18/2022   11:25 AM 01/04/2022    9:59 AM 09/14/2021    8:35 AM  Fall Risk   Falls in the past year? 1 0 0 1 1  Number falls in past yr: 1   1 1   Injury with Fall? 1   1 1   Risk for fall due to : History of fall(s)   History of fall(s)   Follow up Education provided Falls evaluation completed  Falls evaluation completed Education provided     Objective:   LMP  (LMP Unknown)  There is no height or weight on file to calculate BMI.  Hearing/Vision  Oda did not have difficulty with hearing/understanding during the face-to-face interview Anyela did not have difficulty with her vision during the face-to-face interview Reports that she has had a formal eye exam by an eye care professional within the past year Reports that she has not had a formal hearing evaluation within the past year  Cognitive Function:    07/31/2022    1:33 PM  07/28/2021    1:16 PM  6CIT Screen  What Year? 0 points 0 points  What month? 0 points 0 points  What time? 0 points 0 points  Count back from 20 2 points 0 points  Months in reverse 0 points 2 points  Repeat phrase 0 points 6 points  Total Score 2 points 8 points    Normal Cognitive Function Screening: Yes (Normal:0-7, Significant for Dysfunction: >8)  Immunization & Health Maintenance Record Immunization History  Administered Date(s) Administered   Zoster Recombinat (Shingrix) 01/04/2022    Health Maintenance  Topic Date Due   COVID-19 Vaccine (1) Never done   PAP SMEAR-Modifier  Never done   COLONOSCOPY (Pts 45-22yrs Insurance coverage will need to be confirmed)  Never done   Zoster Vaccines- Shingrix (2 of 2) 03/01/2022   INFLUENZA VACCINE  Never done   TETANUS/TDAP  01/04/2023 (Originally 04/29/1985)   MAMMOGRAM  04/18/2024   Hepatitis C Screening  Completed   HIV Screening  Completed   HPV VACCINES  Aged Out       Assessment  This is a routine wellness examination for [employer].  Health Maintenance: Due or Overdue Health Maintenance Due  Topic Date Due   COVID-19 Vaccine (1) Never done   PAP SMEAR-Modifier  Never done   COLONOSCOPY (Pts 45-61yrs Insurance coverage will need to be confirmed)  Never done   Zoster Vaccines- Shingrix (2 of 2) 03/01/2022   INFLUENZA VACCINE  Never done    [facility] Card does not need a referral for Community Assistance: Care Management:   no Social Work:    no Prescription Assistance:  no Nutrition/Diabetes Education:  no   Plan:  Personalized Goals  Goals Addressed             This Visit's Progress    AWV       07/31/2022 AWV Goal: Fall Prevention  Over the next year, patient will decrease their risk for falls by: Using assistive devices, such as a cane or walker, as needed Identifying fall risks within their home and correcting them by: Removing throw rugs Adding handrails to stairs or ramps Removing  clutter and keeping a clear pathway throughout the home Increasing light, especially at night Adding shower handles/bars Raising toilet seat Identifying potential personal risk factors for falls: Medication side effects Incontinence/urgency Vestibular dysfunction Hearing loss Musculoskeletal disorders Neurological disorders Orthostatic hypotension         Personalized Health Maintenance & Screening Recommendations  Influenza vaccine Screening Pap smear and pelvic exam  Colorectal cancer screening COVID Vaccine  Lung Cancer Screening Recommended: yes Patient declines at this time (Low Dose CT Chest recommended if Age 13-80 years, 30 pack-year currently smoking OR have quit w/in past 15 years) Hepatitis C Screening recommended: no HIV Screening recommended: no  Advanced Directives: Written information was not given per the patient's request.  Referrals & Orders No orders of the defined types were placed in this encounter.   Follow-up Plan Follow-up with Loman Brooklyn, FNP  as planned Schedule pap smear, colonoscopy, and 2nd shingles vaccine.    I have personally reviewed and noted the following in the patient's chart:   Medical and social history Use of alcohol, tobacco or illicit drugs  Current medications and supplements Functional ability and status Nutritional status Physical activity Advanced directives List of other physicians Hospitalizations, surgeries, and ER visits in previous 12 months Vitals Screenings to include cognitive, depression, and falls Referrals and appointments  In addition, I have reviewed and discussed with patient certain preventive protocols, quality metrics, and best practice recommendations. A written personalized care plan for preventive services as well as general preventive health recommendations were provided to patient.     Malayiah Mcbrayer, LPN  1/79/1505

## 2022-08-03 ENCOUNTER — Other Ambulatory Visit: Payer: Self-pay | Admitting: Family Medicine

## 2022-08-03 ENCOUNTER — Encounter: Payer: Self-pay | Admitting: Family Medicine

## 2022-08-03 DIAGNOSIS — N1832 Chronic kidney disease, stage 3b: Secondary | ICD-10-CM

## 2022-08-03 DIAGNOSIS — E782 Mixed hyperlipidemia: Secondary | ICD-10-CM

## 2022-08-03 NOTE — Telephone Encounter (Signed)
30 day supply sent in today, pt needs appt for CPE with PCP for further refills. Please schedule.

## 2022-08-03 NOTE — Telephone Encounter (Signed)
Letter Sent.

## 2022-09-07 ENCOUNTER — Other Ambulatory Visit: Payer: Self-pay

## 2022-09-07 ENCOUNTER — Telehealth: Payer: Self-pay

## 2022-09-07 DIAGNOSIS — Z720 Tobacco use: Secondary | ICD-10-CM

## 2022-09-07 DIAGNOSIS — G4721 Circadian rhythm sleep disorder, delayed sleep phase type: Secondary | ICD-10-CM

## 2022-09-07 DIAGNOSIS — R0609 Other forms of dyspnea: Secondary | ICD-10-CM

## 2022-09-07 NOTE — Telephone Encounter (Signed)
After reviewing patients chart for appt tomorrow, Called and spoke to patient to notify that PFT was not ordered at last appt. Patient states that she did not know she had an appt for tomorrow and asked to cancel appt and reschedule for January. Notified her that I would put in a recall for January and would order LCS and PFT for patient to get done in the meantime. Nothing further needed

## 2022-09-08 ENCOUNTER — Ambulatory Visit: Payer: Medicare HMO | Admitting: Pulmonary Disease

## 2022-10-02 ENCOUNTER — Other Ambulatory Visit: Payer: Self-pay | Admitting: Family Medicine

## 2022-10-02 DIAGNOSIS — K219 Gastro-esophageal reflux disease without esophagitis: Secondary | ICD-10-CM

## 2022-10-02 DIAGNOSIS — N1832 Chronic kidney disease, stage 3b: Secondary | ICD-10-CM

## 2022-10-02 DIAGNOSIS — E782 Mixed hyperlipidemia: Secondary | ICD-10-CM

## 2022-10-02 NOTE — Telephone Encounter (Signed)
Britney pt NTBS by new provider 30 days given 08/03/22

## 2022-10-03 MED ORDER — DAPAGLIFLOZIN PROPANEDIOL 10 MG PO TABS: 10.0000 mg | ORAL_TABLET | Freq: Every day | ORAL | 0 refills | Status: DC

## 2022-10-03 MED ORDER — ATORVASTATIN CALCIUM 20 MG PO TABS: 20.0000 mg | ORAL_TABLET | Freq: Every day | ORAL | 0 refills | Status: DC

## 2022-10-03 MED ORDER — OMEPRAZOLE 40 MG PO CPDR: 40.0000 mg | DELAYED_RELEASE_CAPSULE | Freq: Every day | ORAL | 0 refills | Status: DC

## 2022-10-03 NOTE — Addendum Note (Signed)
Addended by: Julious Payer D on: 10/03/2022 01:04 PM   Modules accepted: Orders

## 2022-10-03 NOTE — Telephone Encounter (Signed)
Apt scheduled 10/20/2022 Rakes 30 MINS

## 2022-10-20 ENCOUNTER — Ambulatory Visit: Payer: Medicare HMO | Admitting: Family Medicine

## 2022-10-31 ENCOUNTER — Encounter: Payer: Self-pay | Admitting: *Deleted

## 2022-10-31 ENCOUNTER — Ambulatory Visit (INDEPENDENT_AMBULATORY_CARE_PROVIDER_SITE_OTHER): Payer: Medicare HMO | Admitting: Family Medicine

## 2022-10-31 ENCOUNTER — Encounter: Payer: Self-pay | Admitting: Family Medicine

## 2022-10-31 VITALS — BP 122/73 | HR 83 | Temp 98.1°F | Ht 62.0 in | Wt 221.2 lb

## 2022-10-31 DIAGNOSIS — Z87898 Personal history of other specified conditions: Secondary | ICD-10-CM

## 2022-10-31 DIAGNOSIS — E559 Vitamin D deficiency, unspecified: Secondary | ICD-10-CM | POA: Diagnosis not present

## 2022-10-31 DIAGNOSIS — Z79899 Other long term (current) drug therapy: Secondary | ICD-10-CM

## 2022-10-31 DIAGNOSIS — R0602 Shortness of breath: Secondary | ICD-10-CM

## 2022-10-31 DIAGNOSIS — Z1211 Encounter for screening for malignant neoplasm of colon: Secondary | ICD-10-CM

## 2022-10-31 DIAGNOSIS — N1832 Chronic kidney disease, stage 3b: Secondary | ICD-10-CM | POA: Diagnosis not present

## 2022-10-31 DIAGNOSIS — Z23 Encounter for immunization: Secondary | ICD-10-CM

## 2022-10-31 DIAGNOSIS — K219 Gastro-esophageal reflux disease without esophagitis: Secondary | ICD-10-CM

## 2022-10-31 DIAGNOSIS — F17210 Nicotine dependence, cigarettes, uncomplicated: Secondary | ICD-10-CM

## 2022-10-31 DIAGNOSIS — R69 Illness, unspecified: Secondary | ICD-10-CM | POA: Diagnosis not present

## 2022-10-31 DIAGNOSIS — E782 Mixed hyperlipidemia: Secondary | ICD-10-CM | POA: Diagnosis not present

## 2022-10-31 DIAGNOSIS — F3341 Major depressive disorder, recurrent, in partial remission: Secondary | ICD-10-CM

## 2022-10-31 DIAGNOSIS — Z1212 Encounter for screening for malignant neoplasm of rectum: Secondary | ICD-10-CM

## 2022-10-31 DIAGNOSIS — K5901 Slow transit constipation: Secondary | ICD-10-CM

## 2022-10-31 DIAGNOSIS — F411 Generalized anxiety disorder: Secondary | ICD-10-CM

## 2022-10-31 DIAGNOSIS — Z122 Encounter for screening for malignant neoplasm of respiratory organs: Secondary | ICD-10-CM

## 2022-10-31 DIAGNOSIS — F445 Conversion disorder with seizures or convulsions: Secondary | ICD-10-CM

## 2022-10-31 MED ORDER — DAPAGLIFLOZIN PROPANEDIOL 10 MG PO TABS: 10.0000 mg | ORAL_TABLET | Freq: Every day | ORAL | 1 refills | Status: DC

## 2022-10-31 MED ORDER — DOCUSATE SODIUM 100 MG PO CAPS: 100.0000 mg | ORAL_CAPSULE | Freq: Two times a day (BID) | ORAL | 1 refills | Status: DC

## 2022-10-31 MED ORDER — ALBUTEROL SULFATE HFA 108 (90 BASE) MCG/ACT IN AERS: 2.0000 | INHALATION_SPRAY | Freq: Four times a day (QID) | RESPIRATORY_TRACT | 0 refills | Status: DC | PRN

## 2022-10-31 MED ORDER — OMEPRAZOLE 40 MG PO CPDR: 40.0000 mg | DELAYED_RELEASE_CAPSULE | Freq: Every day | ORAL | 1 refills | Status: DC

## 2022-10-31 MED ORDER — LAMOTRIGINE 200 MG PO TABS: 200.0000 mg | ORAL_TABLET | Freq: Every day | ORAL | 1 refills | Status: DC

## 2022-10-31 MED ORDER — ATORVASTATIN CALCIUM 20 MG PO TABS: 20.0000 mg | ORAL_TABLET | Freq: Every day | ORAL | 1 refills | Status: DC

## 2022-10-31 MED ORDER — GABAPENTIN 300 MG PO CAPS: 300.0000 mg | ORAL_CAPSULE | Freq: Every day | ORAL | 1 refills | Status: DC

## 2022-10-31 NOTE — Progress Notes (Signed)
Subjective:  Patient ID: Maria Campbell, female    DOB: 04-15-1966, 56 y.o.   MRN: [id number]  Patient Care Team: Baruch Gouty, FNP as PCP - General (Family Medicine) Alyson Ingles Candee Furbish, MD as Consulting Physician (Urology) Harlen Labs, MD as Referring Physician (Optometry)   Chief Complaint:  Establish Care Blanch Media patient ) and Medical Management of Chronic Issues   HPI: Maria Campbell is a 56 y.o. female presenting on 10/31/2022 for Establish Care Blanch Media patient ) and Medical Management of Chronic Issues   Pt presents today to establish care with new PCP as her former PCP is no longer at this practice. She has a history of CKD and was followed by nephrology, states she has not seen in over year as things were going well. She is on [place] and tolerates well. Denies changes in her urine output. No weakness, confusion, or leg swelling. She has hyperlipidemia and is on statin therapy, tolerating well. Does not follow a diet or exercise routine. Her GERD is well controlled on current medications. Denies voice change, cough, dysphagia, epigastric pain, hemoptysis, melena, or hematochezia. Pt is on Vit D repletion therapy and denies recent fractures, trouble walking, or significant arthralgias. She has depression, anxiety, ad psychogenic nonepileptic seizures and has not seen psychiatry in several years. She denies recent seizure activity. No recent Lamictal levels noted. She is a heavy smoker, greater than 30 pack year history and has not had lung cancer screening completed. She has exertional shortness of breath at times and is scheduled to see pulmonology soon. Denies new symptoms or worsening shortness of breath.       10/31/2022   12:03 PM 04/18/2022   11:25 AM 09/14/2021    8:35 AM 03/17/2021    2:42 PM  GAD 7 : Generalized Anxiety Score  Nervous, Anxious, on Edge _0 Control/stop worrying 0 _1 Worry too much - different things 0 _2 Trouble relaxing _3 Restless 0 _4 Easily annoyed or irritable 0 _5 Afraid - awful might happen 0 1 1 0  Total GAD 7 Score _6 Anxiety Difficulty Somewhat difficult Somewhat difficult Somewhat difficult Not difficult at all       10/31/2022   12:02 PM 07/31/2022    1:32 PM 04/18/2022   11:26 AM 04/18/2022   11:25 AM 01/04/2022    9:59 AM  Depression screen PHQ 2/9  Decreased Interest 1 0  0 0  Down, Depressed, Hopeless 2 0  0 0  PHQ - 2 Score 3 0  0 0  Altered sleeping 3  2    Tired, decreased energy 2  2    Change in appetite 2  2    Feeling bad or failure about yourself  0  0    Trouble concentrating 2  0    Moving slowly or fidgety/restless 0  0    Suicidal thoughts 0  0    PHQ-9 Score 12      Difficult doing work/chores Somewhat difficult  Somewhat difficult       Relevant past medical, surgical, family, and social history reviewed and updated as indicated.  Allergies and medications reviewed and updated. Data reviewed: Chart in Epic.   Past Medical History:  Diagnosis Date   Arthritis    Barrett's esophagus    CKD (chronic kidney disease) stage 3,  GFR 30-59 ml/min (HCC)    Dyspnea    GERD (gastroesophageal reflux disease)    History of kidney stones    Hyperlipidemia    Kidney stones    Lung mass    PET negative on 05/22/18   Seizures (HCC)    Stroke ([place])    Vitamin D deficiency     Past Surgical History:  Procedure Laterality Date   BREAST BIOPSY WITH RADIO FREQUENCY LOCALIZER Left 06/28/2022   Procedure: BREAST BIOPSY WITH RADIO FREQUENCY LOCALIZER;  Surgeon: Rusty Aus, DO;  Location: [facility];  Service: General;  Laterality: Left;   CYSTOSCOPY W/ URETERAL STENT PLACEMENT  05/05/2021   Procedure: CYSTOSCOPY WITH LEFT URETERAL STENT REPLACEMENT;  Surgeon: Cleon Gustin, MD;  Location: [facility];  Service: Urology;;   [place], URETEROSCOPY AND STENT PLACEMENT Left 05/05/2021   Procedure: CYSTOSCOPY WITH BILATERAL  RETROGRADE PYELOGRAM, BILATERAL URETEROSCOPY,  RIGHT URETERAL [place];  Surgeon: Cleon Gustin, MD;  Location: [facility];  Service: Urology;  Laterality: Left;   HIP SURGERY Left    HOLMIUM LASER APPLICATION Left 1/61/0960   Procedure: HOLMIUM LASER LITHOTRIPSY RIGHT URETERAL CALCULUS;  Surgeon: Cleon Gustin, MD;  Location: [facility];  Service: Urology;  Laterality: Left;   KNEE SURGERY Left    STONE EXTRACTION WITH BASKET  05/05/2021   Procedure: LEFT URETERAL STONE EXTRACTION WITH BASKET;  Surgeon: Cleon Gustin, MD;  Location: [facility];  Service: Urology;;    Social History   Socioeconomic History   Marital status: Single    Spouse name: Not on file   Number of children: 1   Years of education: Not on file   Highest education level: 8th grade  Occupational History   Occupation: disability due to seizures and strokes  Tobacco Use   Smoking status: Every Day    Packs/day: 1.00    Years: 35.00    Total pack years: 35.00    Types: Cigarettes   Smokeless tobacco: Never  Vaping Use   Vaping Use: Never used  Substance and Sexual Activity   Alcohol use: Never   Drug use: Never   Sexual activity: Not Currently    Birth control/protection: None  Other Topics Concern   Not on file  Social History Narrative   Lives with a roommate - has one son - he lives hours away   Social Determinants of Health   Financial Resource Strain: Low Risk  (07/28/2021)   Overall Financial Resource Strain (CARDIA)    Difficulty of Paying Living Expenses: Not hard at all  Food Insecurity: No Food Insecurity (07/28/2021)   Hunger Vital Sign    Worried About Running Out of Food in the Last Year: Never true    Ran Out of Food in the Last Year: Never true  Transportation Needs: No Transportation Needs (07/28/2021)   PRAPARE - Hydrologist (Medical): No    Lack of Transportation (Non-Medical): No  Physical Activity: Insufficiently Active (07/28/2021)   Exercise  Vital Sign    Days of Exercise per Week: 3 days    Minutes of Exercise per Session: 30 min  Stress: No Stress Concern Present (07/28/2021)   [place]    Feeling of Stress : Only a little  Social Connections: Moderately Isolated (07/28/2021)   Social Connection and Isolation Panel [NHANES]    Frequency of Communication with Friends and Family: More than three times a week  Frequency of Social Gatherings with Friends and Family: More than three times a week    Attends Religious Services: Never    Marine scientist or Organizations: No    Attends Archivist Meetings: Never    Marital Status: Living with partner  Intimate Partner Violence: Not At Risk (07/28/2021)   Humiliation, Afraid, Rape, and Kick questionnaire    Fear of Current or Ex-Partner: No    Emotionally Abused: No    Physically Abused: No    Sexually Abused: No    Outpatient Encounter Medications as of 10/31/2022  Medication Sig   ANORO ELLIPTA 62.5-25 MCG/ACT AEPB INHALE 1 PUFF INTO THE LUNGS DAILY AT 6 AM.   aspirin EC 81 MG tablet Take 1 tablet (81 mg total) by mouth at bedtime.   nortriptyline (PAMELOR) 50 MG capsule TAKE 2 CAPSULES IN THE EVENING. (Patient taking differently: Take 100 mg by mouth at bedtime.)   [DISCONTINUED] albuterol (VENTOLIN HFA) 108 (90 Base) MCG/ACT inhaler Inhale 2 puffs into the lungs every 6 (six) hours as needed.   [DISCONTINUED] atorvastatin (LIPITOR) 20 MG tablet Take 1 tablet (20 mg total) by mouth daily.   [DISCONTINUED] dapagliflozin propanediol (FARXIGA) 10 MG TABS tablet Take 1 tablet (10 mg total) by mouth daily before breakfast.   [DISCONTINUED] docusate sodium (COLACE) 100 MG capsule Take 1 capsule (100 mg total) by mouth 2 (two) times daily.   [DISCONTINUED] gabapentin (NEURONTIN) 300 MG capsule TAKE 1 CAPSULE DAILY. (Patient taking differently: Take 300 mg by mouth at bedtime.)   [DISCONTINUED]  lamoTRIgine (LAMICTAL) 200 MG tablet TAKE 1 TABLET ONCE DAILY. (Patient taking differently: Take 200 mg by mouth at bedtime.)   [DISCONTINUED] omeprazole (PRILOSEC) 40 MG capsule Take 1 capsule (40 mg total) by mouth daily.   albuterol (VENTOLIN HFA) 108 (90 Base) MCG/ACT inhaler Inhale 2 puffs into the lungs every 6 (six) hours as needed.   atorvastatin (LIPITOR) 20 MG tablet Take 1 tablet (20 mg total) by mouth daily.   dapagliflozin propanediol (FARXIGA) 10 MG TABS tablet Take 1 tablet (10 mg total) by mouth daily before breakfast.   docusate sodium (COLACE) 100 MG capsule Take 1 capsule (100 mg total) by mouth 2 (two) times daily.   gabapentin (NEURONTIN) 300 MG capsule Take 1 capsule (300 mg total) by mouth daily.   lamoTRIgine (LAMICTAL) 200 MG tablet Take 1 tablet (200 mg total) by mouth daily.   omeprazole (PRILOSEC) 40 MG capsule Take 1 capsule (40 mg total) by mouth daily.   No facility-administered encounter medications on file as of 10/31/2022.    Allergies  Allergen Reactions   Meperidine Hives and Nausea And Vomiting    Fainting and n/v (Demerol)    Other     Bleach-chest tightness/nausea & vomiting/ felt as if I can't breathe   Bee Venom    Dakin's [Sodium Hypochlorite] Nausea And Vomiting    Acitve ingredient within bleach    Review of Systems  Constitutional:  Positive for activity change, appetite change and fatigue. Negative for chills, diaphoresis, fever and unexpected weight change.  HENT: Negative.    Eyes: Negative.  Negative for photophobia and visual disturbance.  Respiratory:  Positive for shortness of breath (exertional, scheduled to see pulmonology). Negative for cough and chest tightness.   Cardiovascular:  Negative for chest pain, palpitations and leg swelling.  Gastrointestinal:  Negative for abdominal pain, blood in stool, constipation, diarrhea, nausea and vomiting.  Endocrine: Negative.  Negative for cold intolerance, heat intolerance, polydipsia,  polyphagia and polyuria.  Genitourinary:  Negative for decreased urine volume, difficulty urinating, dysuria, frequency and urgency.  Musculoskeletal:  Negative for arthralgias and myalgias.  Skin:  Positive for wound.  Allergic/Immunologic: Negative.   Neurological:  Negative for dizziness, tremors, seizures, syncope, speech difficulty, weakness, light-headedness, numbness and headaches.  Hematological: Negative.   Psychiatric/Behavioral:  Positive for decreased concentration, dysphoric mood and sleep disturbance. Negative for agitation, behavioral problems, confusion, hallucinations, self-injury and suicidal ideas. The patient is nervous/anxious. The patient is not hyperactive.   All other systems reviewed and are negative.       Objective:  BP 122/73   Pulse 83   Temp 98.1 F (36.7 C) (Temporal)   Ht _0  (1.575 m)   Wt 221 lb 3.2 oz (100.3 kg)   LMP  (LMP Unknown)   SpO2 96%   BMI 40.46 kg/m    Wt Readings from Last 3 Encounters:  10/31/22 221 lb 3.2 oz (100.3 kg)  07/31/22 216 lb 12.8 oz (98.3 kg)  07/12/22 215 lb (97.5 kg)    Physical Exam Vitals and nursing note reviewed.  Constitutional:      General: She is not in acute distress.    Appearance: Normal appearance. She is morbidly obese. She is not ill-appearing, toxic-appearing or diaphoretic.  HENT:     Head: Normocephalic and atraumatic.     Right Ear: Tympanic membrane, ear canal and external ear normal.     Left Ear: Tympanic membrane, ear canal and external ear normal.     Nose: Nose normal.     Mouth/Throat:     Mouth: Mucous membranes are moist.     Dentition: Abnormal dentition.     Pharynx: Oropharynx is clear.     Comments: Edentulous  Eyes:     Conjunctiva/sclera: Conjunctivae normal.     Pupils: Pupils are equal, round, and reactive to light.  Cardiovascular:     Rate and Rhythm: Normal rate and regular rhythm.     Heart sounds: Normal heart sounds.  Pulmonary:     Effort: Pulmonary effort is  normal.     Breath sounds: Normal breath sounds.  Musculoskeletal:     Cervical back: Normal range of motion and neck supple.     Right lower leg: No edema.     Left lower leg: No edema.  Skin:    General: Skin is warm and dry.     Capillary Refill: Capillary refill takes less than 2 seconds.     Comments: Scattered healing lesions to bilateral arms, forehead, and chin  Neurological:     General: No focal deficit present.     Mental Status: She is alert and oriented to person, place, and time.     Gait: Gait abnormal (hunched over, slow).  Psychiatric:        Mood and Affect: Mood normal.        Behavior: Behavior normal. Behavior is cooperative.        Thought Content: Thought content normal.        Judgment: Judgment normal.     Results for orders placed or performed during the hospital encounter of 06/28/22  Pregnancy, urine POC  Result Value Ref Range   Preg Test, Ur NEGATIVE NEGATIVE  Surgical pathology  Result Value Ref Range   SURGICAL PATHOLOGY      SURGICAL PATHOLOGY CASE: [id number] PATIENT: [facility] Surgical Pathology Report     Clinical History: complex sclerosing breast lesion, left     FINAL MICROSCOPIC  DIAGNOSIS:  A. BREAST, LEFT, BIOPSY: Radial sclerosing lesion with biopsy site changes. Fibrocystic changes with usual epithelial hyperplasia. Negative for ADH, ALH, DCIS and invasive carcinoma. The margins of resection are normal.  B. BREAST, MEDIAL, LEFT, BIOPSY: Normal adipose tissue and breast tissue with focal duct ectasia. Negative for ADH, ALH, DCIS and invasive carcinoma.  C. BREAST, POSTERIOR, INFERIOR, LEFT, BIOPSY: Two benign lymph nodes in a segment of adipose tissue. Mammary ducts and lobules are not identified. Negative for malignancy.   GROSS DESCRIPTION:  A.  Specimen type: Received in formalin is a clinically left lumpectomy specimen with a "radiofrequency tag" that is identified.  TIF and CIT are not provided, nor  are they identified within [employer]. Size: 8.4  x 7.2 x 4.0 cm (A-P x M-L x S-I) Orientation: Anterior = green, posterior = black, superior = red, inferior = blue, medial = yellow, and lateral = orange Cut surface: The specimen is sectioned from anterior to posterior revealing a X shaped clip within white, fibrous, poorly defined lesion that measures 1.4 x 1.1 x 0.7 cm Margins: The lesion is 1.1 cm from the inferior margin, 1.1 cm from the superior margin, 1.4 cm from the lateral margin, 3.2 cm from the medial margin, 1.7 cm from the posterior margin, and 4.3 cm from the anterior margin. Block summary: A1-A2 lesion, central, no true margin A3 Superior margin closest to lesion A4 inferior margin closest to lesion A5 medial margin closest to lesion A6 lateral margin closest to lesion A7 anterior margin closest to lesion A8 posterior margin closest to lesion   B.  Received in formalin is "medial margin".  The specimen consists of an uninked, undesignated portion of fibroadipose tissue measuring 5.1 x 4.0 cm, presu mably excised to depth of 1.0 cm.  The new true margin is not designated.  The entire external surface is inked black..  The specimen is serially sectioned revealing an underlying homogenous, fatty cut surface.  The specimen is entirely submitted in B1-B12.  C.  Received in formalin is "posterior inferior margin".  The specimen consists of an uninked, undesignated portion of fibroadipose tissue measuring 6.7 x 5.5 x 2.0 cm.  The new true margin is not designated. The entire external surface is inked black.  The specimen is serially sectioned revealing an underlying homogenous, fatty cut surface. There is 2 white-tan, firm, well-defined nodules that measure 1.0 cm and 0.9. Both nodules approach the black inked surfaces.  The nodules are 2.9 cm from each other and may represent lymph node candidates.  No further distinct lesions are grossly identified.  Representative  sections are submitted as follows: C1-C2 larger lymph node candidate, entirely to black inked surfaces C3  smaller lymph node candidate, bisected to black inked surfaces C4-C10 representative sections to black inked surface (KW, 06/29/2022).   Final Diagnosis performed by Unknown Jim, MD.   Electronically signed 06/30/2022 Technical component performed at [facility], [place] [address], [place], [place] [address].  Professional component performed at [employer]. [facility], [place] [address] [place], [place] [address].  Immunohistochemistry Technical component (if applicable) was performed at [facility]. [address], [place], [place],  [address].   IMMUNOHISTOCHEMISTRY DISCLAIMER (if applicable): Some of these immunohistochemical stains may have been developed and the performance characteristics determine by [facility]. Some may not have been cleared or approved by the U.S. Food and Drug Administration. The FDA has determined that such clearance or approval is not necessary. This  test is used for clinical pu rposes. It should not be regarded as investigational or for research. This laboratory is certified under the [place] (CLIA-88) as qualified to perform high complexity clinical laboratory testing.  The controls stained appropriately.        Pertinent labs & imaging results that were available during my care of the patient were reviewed by me and considered in my medical decision making.  Assessment & Plan:  Shykeria was seen today for establish care and medical management of chronic issues.  Diagnoses and all orders for this visit:  Mixed hyperlipidemia Diet encouraged - increase intake of fresh fruits and vegetables, increase intake of lean proteins. Bake, broil, or grill foods. Avoid fried, greasy, and fatty foods. Avoid fast foods. Increase intake of fiber-rich whole  grains. Exercise encouraged - at least 150 minutes per week and advance as tolerated. Goal BMI < 25. Continue medications as prescribed. Follow up in 3-6 months as discussed.  -     atorvastatin (LIPITOR) 20 MG tablet; Take 1 tablet (20 mg total) by mouth daily. -     CMP14+EGFR -     Lipid panel  Stage 3b chronic kidney disease ([place]) Encouraged to see nephrology on a yearly basis to stay established with practice. Labs pending. Continue Farxiga.  -     dapagliflozin propanediol (FARXIGA) 10 MG TABS tablet; Take 1 tablet (10 mg total) by mouth daily before breakfast. -     CMP14+EGFR -     CBC with Differential/Platelet -     VITAMIN D 25 Hydroxy (Vit-D Deficiency, Fractures)  History of seizure Has not been to neurology. Has been on medications for several years. Will refer to get reestablished with neurology. Will check Lamictal levels.   -     gabapentin (NEURONTIN) 300 MG capsule; Take 1 capsule (300 mg total) by mouth daily. -     lamoTRIgine (LAMICTAL) 200 MG tablet; Take 1 tablet (200 mg total) by mouth daily. -     Ambulatory referral to Neurology  Gastroesophageal reflux disease, unspecified whether esophagitis present No red flags present. Diet discussed. Avoid fried, spicy, fatty, greasy, and acidic foods. Avoid caffeine, nicotine, and alcohol. Do not eat 2-3 hours before bedtime and stay upright for at least 1-2 hours after eating. Eat small frequent meals. Avoid NSAID's like motrin and aleve. Medications as prescribed. Report any new or worsening symptoms. Follow up as discussed or sooner if needed.   -     omeprazole (PRILOSEC) 40 MG capsule; Take 1 capsule (40 mg total) by mouth daily.  Psychogenic nonepileptic seizure Will refer to reestablish with neurology and psychiatry.  -     gabapentin (NEURONTIN) 300 MG capsule; Take 1 capsule (300 mg total) by mouth daily. -     lamoTRIgine (LAMICTAL) 200 MG tablet; Take 1 tablet (200 mg total) by mouth daily. -     Ambulatory  referral to Psychiatry -     Ambulatory referral to Neurology  Vitamin D deficiency Labs pending. Continue repletion therapy. If indicated, will change repletion dosage. Eat foods rich in Vit D including milk, orange juice, yogurt with vitamin D added, salmon or mackerel, canned tuna fish, cereals with vitamin D added, and cod liver oil. Get out in the sun but make sure to wear at least SPF 30 sunscreen.  -     CMP14+EGFR -     VITAMIN D 25 Hydroxy (Vit-D Deficiency, Fractures)  Recurrent major depressive disorder in partial  remission ([place]) Generalized anxiety disorder Referral to psychiatry. Will check thyroid function.  -     Thyroid Panel With TSH -     Ambulatory referral to Psychiatry  Morbid obesity ([place]) Diet and exercise encouraged. Will obtain labs today.  -     CMP14+EGFR -     CBC with Differential/Platelet -     Lipid panel -     Thyroid Panel With TSH  Exertional shortness of breath Has follow up with pulmonology scheduled. Will refill below for as needed use.  -     albuterol (VENTOLIN HFA) 108 (90 Base) MCG/ACT inhaler; Inhale 2 puffs into the lungs every 6 (six) hours as needed.  High risk medication use Will check lamictal levels today along with renal and hepatic function.  -     lamoTRIgine (LAMICTAL) 200 MG tablet; Take 1 tablet (200 mg total) by mouth daily. -     Lamotrigine level -     CMP14+EGFR  Screening for colorectal cancer Referral placed -     Ambulatory referral to Gastroenterology  Screening for lung cancer Smoking greater than 30 pack years Order placed for LDCT chest for lung cancer screening.  -     CT CHEST LUNG CA SCREEN LOW DOSE W/O CM; Future  Slow transit constipation High fiber and adequate water intake discussed in detail. Continue below.  -     docusate sodium (COLACE) 100 MG capsule; Take 1 capsule (100 mg total) by mouth 2 (two) times daily.  Need for vaccination -     Zoster Recombinant (Shingrix )     Continue all other  maintenance medications.  Follow up plan: Return in about 3 months (around 01/30/2023), or if symptoms worsen or fail to improve, for chronic follow up.   Continue healthy lifestyle choices, including diet (rich in fruits, vegetables, and lean proteins, and low in salt and simple carbohydrates) and exercise (at least 30 minutes of moderate physical activity daily).  Educational handout given for health maintenance.  The above assessment and management plan was discussed with the patient. The patient verbalized understanding of and has agreed to the management plan. Patient is aware to call the clinic if they develop any new symptoms or if symptoms persist or worsen. Patient is aware when to return to the clinic for a follow-up visit. Patient educated on when it is appropriate to go to the emergency department.   Monia Pouch, FNP-C [facility] [phone]

## 2022-11-01 LAB — CBC WITH DIFFERENTIAL/PLATELET
Basophils Absolute: 0 10*3/uL (ref 0.0–0.2)
Basos: 0 %
EOS (ABSOLUTE): 0.1 10*3/uL (ref 0.0–0.4)
Eos: 2 %
Hematocrit: 40.8 % (ref 34.0–46.6)
Hemoglobin: 12.8 g/dL (ref 11.1–15.9)
Immature Grans (Abs): 0 10*3/uL (ref 0.0–0.1)
Immature Granulocytes: 0 %
Lymphocytes Absolute: 1.3 10*3/uL (ref 0.7–3.1)
Lymphs: 22 %
MCH: 25.4 pg — ABNORMAL LOW (ref 26.6–33.0)
MCHC: 31.4 g/dL — ABNORMAL LOW (ref 31.5–35.7)
MCV: 81 fL (ref 79–97)
Monocytes Absolute: 0.4 10*3/uL (ref 0.1–0.9)
Monocytes: 6 %
Neutrophils Absolute: 4.2 10*3/uL (ref 1.4–7.0)
Neutrophils: 70 %
Platelets: 214 10*3/uL (ref 150–450)
RBC: 5.03 x10E6/uL (ref 3.77–5.28)
RDW: 18.2 % — ABNORMAL HIGH (ref 11.7–15.4)
WBC: 6 10*3/uL (ref 3.4–10.8)

## 2022-11-01 LAB — CMP14+EGFR
ALT: 7 IU/L (ref 0–32)
AST: 10 IU/L (ref 0–40)
Albumin/Globulin Ratio: 1.3 (ref 1.2–2.2)
Albumin: 4 g/dL (ref 3.8–4.9)
Alkaline Phosphatase: 121 IU/L (ref 44–121)
BUN/Creatinine Ratio: 7 — ABNORMAL LOW (ref 9–23)
BUN: 9 mg/dL (ref 6–24)
Bilirubin Total: <0.2 mg/dL (ref 0.0–1.2)
CO2: 22 mmol/L (ref 20–29)
Calcium: 9.6 mg/dL (ref 8.7–10.2)
Chloride: 103 mmol/L (ref 96–106)
Creatinine, Ser: 1.21 mg/dL — ABNORMAL HIGH (ref 0.57–1.00)
Globulin, Total: 3.1 g/dL (ref 1.5–4.5)
Glucose: 85 mg/dL (ref 70–99)
Potassium: 4.5 mmol/L (ref 3.5–5.2)
Sodium: 141 mmol/L (ref 134–144)
Total Protein: 7.1 g/dL (ref 6.0–8.5)
eGFR: 52 mL/min/{1.73_m2} — ABNORMAL LOW (ref >59)

## 2022-11-01 LAB — LIPID PANEL
Chol/HDL Ratio: 3.8 ratio (ref 0.0–4.4)
Cholesterol, Total: 183 mg/dL (ref 100–199)
HDL: 48 mg/dL (ref >39)
LDL Chol Calc (NIH): 109 mg/dL — ABNORMAL HIGH (ref 0–99)
Triglycerides: 146 mg/dL (ref 0–149)
VLDL Cholesterol Cal: 26 mg/dL (ref 5–40)

## 2022-11-01 LAB — THYROID PANEL WITH TSH
Free Thyroxine Index: 1.8 (ref 1.2–4.9)
T3 Uptake Ratio: 21 % — ABNORMAL LOW (ref 24–39)
T4, Total: 8.4 ug/dL (ref 4.5–12.0)
TSH: 2.08 u[IU]/mL (ref 0.450–4.500)

## 2022-11-01 LAB — LAMOTRIGINE LEVEL: Lamotrigine Lvl: 6.3 ug/mL (ref 2.0–20.0)

## 2022-11-01 LAB — VITAMIN D 25 HYDROXY (VIT D DEFICIENCY, FRACTURES): Vit D, 25-Hydroxy: 12.1 ng/mL — ABNORMAL LOW (ref 30.0–100.0)

## 2022-11-01 MED ORDER — VITAMIN D (ERGOCALCIFEROL) 1.25 MG (50000 UNIT) PO CAPS: 50000.0000 [IU] | ORAL_CAPSULE | ORAL | 3 refills | Status: DC

## 2022-11-01 NOTE — Addendum Note (Signed)
Addended by: Sonny Masters on: 11/01/2022 01:34 PM   Modules accepted: Orders

## 2022-11-07 ENCOUNTER — Ambulatory Visit (HOSPITAL_COMMUNITY): Admission: RE | Admit: 2022-11-07 | Payer: Medicare HMO | Source: Ambulatory Visit

## 2022-12-05 ENCOUNTER — Other Ambulatory Visit: Payer: Self-pay | Admitting: Family Medicine

## 2022-12-05 DIAGNOSIS — G43009 Migraine without aura, not intractable, without status migrainosus: Secondary | ICD-10-CM

## 2022-12-05 NOTE — Addendum Note (Signed)
Addended by: Antonietta Barcelona D on: 12/05/2022 01:35 PM   Modules accepted: Orders

## 2022-12-07 ENCOUNTER — Encounter: Payer: Self-pay | Admitting: *Deleted

## 2022-12-21 ENCOUNTER — Encounter: Payer: Self-pay | Admitting: Family Medicine

## 2022-12-21 ENCOUNTER — Ambulatory Visit (INDEPENDENT_AMBULATORY_CARE_PROVIDER_SITE_OTHER): Payer: Medicare HMO | Admitting: Family Medicine

## 2022-12-21 ENCOUNTER — Ambulatory Visit: Payer: Medicare HMO | Admitting: Family Medicine

## 2022-12-21 VITALS — BP 110/79 | HR 92 | Temp 97.6°F | Ht 62.0 in | Wt 210.5 lb

## 2022-12-21 DIAGNOSIS — F445 Conversion disorder with seizures or convulsions: Secondary | ICD-10-CM

## 2022-12-21 DIAGNOSIS — R69 Illness, unspecified: Secondary | ICD-10-CM | POA: Diagnosis not present

## 2022-12-21 DIAGNOSIS — G43709 Chronic migraine without aura, not intractable, without status migrainosus: Secondary | ICD-10-CM

## 2022-12-21 MED ORDER — NORTRIPTYLINE HCL 25 MG PO CAPS: 25.0000 mg | ORAL_CAPSULE | Freq: Every day | ORAL | 0 refills | Status: DC

## 2022-12-21 NOTE — Progress Notes (Signed)
Acute Office Visit  Subjective:     Patient ID: Maria Campbell, female    DOB: [birth year], 57 y.o.   MRN: [id number]  Chief Complaint  Patient presents with   Migraine    Migraine    Patient is in today for chronic migraine follow up. She has been on amitriptyline 100 mg nightly for prevention for many years with good control. She reports that she has been out of this for 2-3 weeks now. Now that she has been out, she is having a daily headache with nausea. She denies any other symptoms. She was referred to neurology by her PCP for management of her migraines and seizure disorder as she is on multiple high risk medications. Maria Campbell has not yet scheduled this appointment. She denies any recent seizures.      12/21/2022   11:03 AM 10/31/2022   12:02 PM 07/31/2022    1:32 PM  Depression screen PHQ 2/9  Decreased Interest 2 1 0  Down, Depressed, Hopeless 1 2 0  PHQ - 2 Score 3 3 0  Altered sleeping 3 3   Tired, decreased energy 2 2   Change in appetite 2 2   Feeling bad or failure about yourself  0 0   Trouble concentrating 1 2   Moving slowly or fidgety/restless 0 0   Suicidal thoughts 0 0   PHQ-9 Score 11 12   Difficult doing work/chores Somewhat difficult Somewhat difficult       12/21/2022   11:04 AM 10/31/2022   12:03 PM 04/18/2022   11:25 AM 09/14/2021    8:35 AM  GAD 7 : Generalized Anxiety Score  Nervous, Anxious, on Edge 1 2 2 2   Control/stop worrying 1 0 1 1  Worry too much - different things 2 0 2 2  Trouble relaxing 2 3 2 2   Restless 2 0 1 1  Easily annoyed or irritable 2 0 2 2  Afraid - awful might happen 0 0 1 1  Total GAD 7 Score 10 5 11 11   Anxiety Difficulty Somewhat difficult Somewhat difficult Somewhat difficult Somewhat difficult      ROS Negative unless specially indicated above in HPI.     Objective:    BP 110/79   Pulse 92   Temp 97.6 F (36.4 C) (Temporal)   Ht 5\' 2"  (1.575 m)   Wt 210 lb 8 oz (95.5 kg)   LMP  (LMP Unknown)   SpO2 97%    BMI 38.50 kg/m    Physical Exam Vitals and nursing note reviewed.  Constitutional:      General: She is not in acute distress.    Appearance: She is obese. She is not ill-appearing, toxic-appearing or diaphoretic.  Cardiovascular:     Rate and Rhythm: Normal rate and regular rhythm.     Heart sounds: Normal heart sounds. No murmur heard. Pulmonary:     Effort: Pulmonary effort is normal. No respiratory distress.     Breath sounds: Normal breath sounds.  Musculoskeletal:     Cervical back: Neck supple. No rigidity.     Right lower leg: No edema.     Left lower leg: No edema.  Skin:    General: Skin is warm and dry.  Neurological:     General: No focal deficit present.     Mental Status: She is alert and oriented to person, place, and time.  Psychiatric:        Mood and Affect: Mood normal.  Behavior: Behavior normal.        Thought Content: Thought content normal.        Judgment: Judgment normal.     No results found for any visits on 12/21/22.      Assessment & Plan:   Maria Campbell was seen today for migraine.  Diagnoses and all orders for this visit:  Chronic migraine without aura without status migrainosus, not intractable Psychogenic nonepileptic seizure Uncontrolled now that she has ran out of nortriptyline. Discussed neurology referral given concurrent seizure disorder and high risk medications. Will restart nortriptyline at 25 mg but instructed patient to schedule appt with [facility] neurology for further evaluation and management.  -     nortriptyline (PAMELOR) 25 MG capsule; Take 1 capsule (25 mg total) by mouth at bedtime. Needs to establish with neurology for further refills.   Return if symptoms worsen or fail to improve, for follow up as scheduled with PCP.  The patient indicates understanding of these issues and agrees with the plan.  Gwenlyn Perking, FNP

## 2022-12-21 NOTE — Patient Instructions (Addendum)
[facility] Address: [address], [place], [place] [address] Hours:  Open ? Closes 4:30?PM Phone: [phone]  [place] [facility]. [address], [address] [place],  [place]  [address] Get Driving Directions Main: [phone]

## 2022-12-22 ENCOUNTER — Encounter: Payer: Self-pay | Admitting: Neurology

## 2022-12-27 ENCOUNTER — Ambulatory Visit (INDEPENDENT_AMBULATORY_CARE_PROVIDER_SITE_OTHER): Payer: Medicare HMO | Admitting: Pulmonary Disease

## 2022-12-27 ENCOUNTER — Encounter: Payer: Self-pay | Admitting: Pulmonary Disease

## 2022-12-27 VITALS — BP 128/82 | HR 100 | Ht 62.0 in | Wt 213.0 lb

## 2022-12-27 DIAGNOSIS — G4721 Circadian rhythm sleep disorder, delayed sleep phase type: Secondary | ICD-10-CM | POA: Diagnosis not present

## 2022-12-27 DIAGNOSIS — J4489 Other specified chronic obstructive pulmonary disease: Secondary | ICD-10-CM

## 2022-12-27 DIAGNOSIS — Z720 Tobacco use: Secondary | ICD-10-CM

## 2022-12-27 DIAGNOSIS — G4733 Obstructive sleep apnea (adult) (pediatric): Secondary | ICD-10-CM | POA: Diagnosis not present

## 2022-12-27 DIAGNOSIS — J439 Emphysema, unspecified: Secondary | ICD-10-CM

## 2022-12-27 NOTE — Assessment & Plan Note (Signed)
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

## 2022-12-27 NOTE — Progress Notes (Signed)
   Subjective:    Patient ID: Maria Campbell, female    DOB: 22-Jul-1966, 57 y.o.   MRN: [id number]  HPI  57 year old smoker for follow-up of COPD and OSA She smoked about 2 packs/day since age 33,  more than 50 pack years.  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    Chief Complaint  Patient presents with   Follow-up    Has not had pft or HST done yet  States she has not received a call about getting them done    26-month follow-up visit. On her last visit we ordered PFTs and home sleep test but she canceled both appointments. She continues to smoke about a pack per day. Bedtime can be as late as 3 AM and wake up time is generally 10 AM.  She continues to come sleepiness in the daytime. Shortness of breath is controlled on Anoro, she uses albuterol about 3-4 times a week    Significant tests/ events reviewed   PFTs 04/2018 no airway obstruction, ratio 79, FEV1 94%, DLCO 76%   CT chest 04/2018 1 cm LL pleural based nodule, PET neg PET 05/2018  Subpleural left lower lobe scarring is stable from abdominal CTs dating back to 02/17/2015, and  not associated with any abnormal metabolic activity.   Review of Systems neg for any significant sore throat, dysphagia, itching, sneezing, nasal congestion or excess/ purulent secretions, fever, chills, sweats, unintended wt loss, pleuritic or exertional cp, hempoptysis, orthopnea pnd or change in chronic leg swelling. Also denies presyncope, palpitations, heartburn, abdominal pain, nausea, vomiting, diarrhea or change in bowel or urinary habits, dysuria,hematuria, rash, arthralgias, visual complaints, headache, numbness weakness or ataxia.     Objective:   Physical Exam  Gen. Pleasant, obese, in no distress, appears  older than stated age ENT - no lesions, no post nasal drip Neck: No JVD, no thyromegaly, no carotid bruits Lungs: no use of accessory muscles, no dullness to percussion, decreased without rales or rhonchi  Cardiovascular: Rhythm regular, heart sounds  normal, no murmurs or gallops, no peripheral edema Musculoskeletal: No deformities, no cyanosis or clubbing , no tremors       Assessment & Plan:   OSA -high clinical likelihood, we will reschedule home sleep test.  COPD -most likely, will schedule PFTs

## 2022-12-27 NOTE — Patient Instructions (Signed)
  X schedule HST & PFTs  X Lung cancer screening scan

## 2022-12-27 NOTE — Assessment & Plan Note (Signed)
Smoking cessation was again emphasized is the most important intervention that would add years to her life. Will proceed with lung cancer screening CT.  We reviewed risks and benefits given her long history of smoking and the possibility of finding new nodules or other findings that might provoke anxiety

## 2023-01-05 ENCOUNTER — Ambulatory Visit: Payer: Medicare HMO | Admitting: Family Medicine

## 2023-01-09 ENCOUNTER — Ambulatory Visit (HOSPITAL_COMMUNITY)
Admission: RE | Admit: 2023-01-09 | Discharge: 2023-01-09 | Disposition: A | Discharge status: Home / Self Care | Payer: Medicare HMO | Source: Ambulatory Visit | Attending: Pulmonary Disease | Admitting: Pulmonary Disease

## 2023-01-09 DIAGNOSIS — R0609 Other forms of dyspnea: Secondary | ICD-10-CM | POA: Diagnosis present

## 2023-01-09 LAB — PULMONARY FUNCTION TEST
DL/VA % pred: 98 %
DL/VA: 4.23 ml/min/mmHg/L
DLCO unc % pred: 89 %
DLCO unc: 17.12 ml/min/mmHg
FEF 25-75 Pre: 2.5 L/sec
FEF2575-%Pred-Pre: 105 %
FEV1-%Change-Post: -42 %
FEV1-%Pred-Post: 54 %
FEV1-%Pred-Pre: 94 %
FEV1-Post: 1.34 L
FEV1-Pre: 2.31 L
FEV1FVC-%Change-Post: -22 %
FEV1FVC-%Pred-Pre: 106 %
FEV6-%Change-Post: -26 %
FEV6-%Pred-Post: 66 %
FEV6-%Pred-Pre: 90 %
FEV6-Post: 2.03 L
FEV6-Pre: 2.76 L
FEV6FVC-%Change-Post: -1 %
FEV6FVC-%Pred-Post: 102 %
FEV6FVC-%Pred-Pre: 103 %
FVC-%Change-Post: -25 %
FVC-%Pred-Post: 65 %
FVC-%Pred-Pre: 87 %
FVC-Post: 2.05 L
FVC-Pre: 2.76 L
Post FEV1/FVC ratio: 65 %
Post FEV6/FVC ratio: 99 %
Pre FEV1/FVC ratio: 84 %
Pre FEV6/FVC Ratio: 100 %

## 2023-01-09 MED ORDER — ALBUTEROL SULFATE (2.5 MG/3ML) 0.083% IN NEBU
2.5000 mg | INHALATION_SOLUTION | Freq: Once | RESPIRATORY_TRACT | Status: AC
  Administered 2023-01-09: 2.5 mg via RESPIRATORY_TRACT

## 2023-01-19 ENCOUNTER — Ambulatory Visit (INDEPENDENT_AMBULATORY_CARE_PROVIDER_SITE_OTHER): Payer: Medicare HMO | Admitting: Neurology

## 2023-01-19 ENCOUNTER — Encounter: Payer: Self-pay | Admitting: Neurology

## 2023-01-19 VITALS — BP 109/80 | HR 96 | Ht 62.0 in | Wt 211.5 lb

## 2023-01-19 DIAGNOSIS — G43709 Chronic migraine without aura, not intractable, without status migrainosus: Secondary | ICD-10-CM | POA: Diagnosis not present

## 2023-01-19 DIAGNOSIS — Z87898 Personal history of other specified conditions: Secondary | ICD-10-CM

## 2023-01-19 MED ORDER — NORTRIPTYLINE HCL 50 MG PO CAPS: 50.0000 mg | ORAL_CAPSULE | Freq: Every day | ORAL | 3 refills | Status: DC

## 2023-01-19 NOTE — Patient Instructions (Signed)
Good to meet you.  Refills sent for nortriptyline '50mg'$ : take 1 capsule every night  2. Continue all your other medications with your PCP  3. Schedule EEG  4. Follow-up in 6 months, call for any changes
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
## 2023-01-24 ENCOUNTER — Other Ambulatory Visit: Payer: Medicare HMO

## 2023-01-31 ENCOUNTER — Ambulatory Visit (INDEPENDENT_AMBULATORY_CARE_PROVIDER_SITE_OTHER): Payer: Medicare HMO | Admitting: Neurology

## 2023-01-31 DIAGNOSIS — Z87898 Personal history of other specified conditions: Secondary | ICD-10-CM

## 2023-01-31 DIAGNOSIS — R569 Unspecified convulsions: Secondary | ICD-10-CM

## 2023-01-31 NOTE — Progress Notes (Signed)
EEG complete - results pending 

## 2023-02-13 ENCOUNTER — Ambulatory Visit (INDEPENDENT_AMBULATORY_CARE_PROVIDER_SITE_OTHER): Payer: Medicare HMO | Admitting: Family Medicine

## 2023-02-13 ENCOUNTER — Encounter: Payer: Self-pay | Admitting: Family Medicine

## 2023-02-13 DIAGNOSIS — K219 Gastro-esophageal reflux disease without esophagitis: Secondary | ICD-10-CM

## 2023-02-13 MED ORDER — OMEPRAZOLE 40 MG PO CPDR: 40.0000 mg | DELAYED_RELEASE_CAPSULE | Freq: Every day | ORAL | 0 refills | Status: DC

## 2023-02-13 NOTE — Progress Notes (Signed)
No need for appointment today

## 2023-02-15 DIAGNOSIS — R69 Illness, unspecified: Secondary | ICD-10-CM | POA: Diagnosis not present

## 2023-02-15 DIAGNOSIS — Z8249 Family history of ischemic heart disease and other diseases of the circulatory system: Secondary | ICD-10-CM | POA: Diagnosis not present

## 2023-02-15 DIAGNOSIS — G2581 Restless legs syndrome: Secondary | ICD-10-CM | POA: Diagnosis not present

## 2023-02-15 DIAGNOSIS — G4733 Obstructive sleep apnea (adult) (pediatric): Secondary | ICD-10-CM | POA: Diagnosis not present

## 2023-02-15 DIAGNOSIS — I700 Atherosclerosis of aorta: Secondary | ICD-10-CM | POA: Diagnosis not present

## 2023-02-15 DIAGNOSIS — K219 Gastro-esophageal reflux disease without esophagitis: Secondary | ICD-10-CM | POA: Diagnosis not present

## 2023-02-15 DIAGNOSIS — E669 Obesity, unspecified: Secondary | ICD-10-CM | POA: Diagnosis not present

## 2023-02-15 DIAGNOSIS — G40909 Epilepsy, unspecified, not intractable, without status epilepticus: Secondary | ICD-10-CM | POA: Diagnosis not present

## 2023-02-15 DIAGNOSIS — E785 Hyperlipidemia, unspecified: Secondary | ICD-10-CM | POA: Diagnosis not present

## 2023-02-15 DIAGNOSIS — Z7984 Long term (current) use of oral hypoglycemic drugs: Secondary | ICD-10-CM | POA: Diagnosis not present

## 2023-02-15 DIAGNOSIS — Z008 Encounter for other general examination: Secondary | ICD-10-CM | POA: Diagnosis not present

## 2023-02-15 DIAGNOSIS — Z885 Allergy status to narcotic agent status: Secondary | ICD-10-CM | POA: Diagnosis not present

## 2023-02-15 DIAGNOSIS — F411 Generalized anxiety disorder: Secondary | ICD-10-CM | POA: Diagnosis not present

## 2023-02-15 DIAGNOSIS — J449 Chronic obstructive pulmonary disease, unspecified: Secondary | ICD-10-CM | POA: Diagnosis not present

## 2023-03-01 ENCOUNTER — Telehealth: Payer: Self-pay | Admitting: Anesthesiology

## 2023-03-01 NOTE — Telephone Encounter (Signed)
Pt called requesting EEG results.  °

## 2023-03-02 NOTE — Procedures (Signed)
ELECTROENCEPHALOGRAM REPORT  Date of Study: 01/31/2023  Patient's Name: Maria Campbell MRN: [id number] Date of Birth: 1966/06/20  Referring Provider: Dr. Patrcia Dolly  Clinical History: This is a 57 year old woman with a history of shaking spells, non-epileptic seizures. EEG for classification.  Medications: Lamotrigine,Gabapentin, nortriptyline, atorvastatin  Technical Summary: A multichannel digital EEG recording measured by the international 10-20 system with electrodes applied with paste and impedances below 5000 ohms performed in our laboratory with EKG monitoring in an awake and asleep patient.  Hyperventilation was not performed. Photic stimulation was performed.  The digital EEG was referentially recorded, reformatted, and digitally filtered in a variety of bipolar and referential montages for optimal display.    Description: The patient is awake and asleep during the recording.  During maximal wakefulness, there is a symmetric, medium voltage 10.5 Hz posterior dominant rhythm that attenuates with eye opening.  The record is symmetric.  During drowsiness and sleep, there is an increase in theta slowing of the background.  Vertex waves and symmetric sleep spindles were seen. Photic stimulation did not elicit any abnormalities.  There were no epileptiform discharges or electrographic seizures seen.    EKG lead was unremarkable.  Impression: This awake and asleep EEG is normal.    Clinical Correlation: A normal EEG does not exclude a clinical diagnosis of epilepsy.  If further clinical questions remain, prolonged EEG may be helpful.  Clinical correlation is advised.   Patrcia Dolly, M.D.

## 2023-03-02 NOTE — Telephone Encounter (Signed)
Pt called an informed that EEG is normal. Continue all medications.

## 2023-03-02 NOTE — Telephone Encounter (Signed)
Pls let her know EEG is normal. Continue all medications. Thanks

## 2023-03-06 ENCOUNTER — Telehealth: Payer: Self-pay | Admitting: Family Medicine

## 2023-03-06 NOTE — Telephone Encounter (Signed)
Contacted [name] to schedule their annual wellness visit. Appointment made for 03/15/2023.  Thank you,  Judeth Cornfield,  AMB Clinical Support [facility] AWV Program Direct Dial ??[phone]

## 2023-03-07 ENCOUNTER — Ambulatory Visit (HOSPITAL_COMMUNITY): Payer: Medicare HMO | Admitting: Psychiatry

## 2023-03-07 ENCOUNTER — Other Ambulatory Visit: Payer: Self-pay | Admitting: Family Medicine

## 2023-03-07 DIAGNOSIS — Z87898 Personal history of other specified conditions: Secondary | ICD-10-CM

## 2023-03-07 DIAGNOSIS — F445 Conversion disorder with seizures or convulsions: Secondary | ICD-10-CM

## 2023-03-07 DIAGNOSIS — E782 Mixed hyperlipidemia: Secondary | ICD-10-CM

## 2023-03-07 DIAGNOSIS — Z79899 Other long term (current) drug therapy: Secondary | ICD-10-CM

## 2023-03-07 DIAGNOSIS — N1832 Chronic kidney disease, stage 3b: Secondary | ICD-10-CM

## 2023-03-15 ENCOUNTER — Telehealth: Payer: Self-pay | Admitting: Family Medicine

## 2023-03-15 NOTE — Telephone Encounter (Signed)
Contacted Maria Campbell to schedule their annual wellness visit. Appointment made for 03/28/2023.  Thank you,  Judeth Cornfield,  AMB Clinical Support [facility] AWV Program Direct Dial ??[phone]

## 2023-03-20 ENCOUNTER — Telehealth: Payer: Self-pay

## 2023-03-20 NOTE — Telephone Encounter (Signed)
Pt called informed EEG was normal

## 2023-03-20 NOTE — Telephone Encounter (Signed)
-----   Message from Van Clines, MD sent at 03/16/2023  1:46 PM EDT ----- Pls let her know the EEG was normal. Thanks

## 2023-03-28 ENCOUNTER — Ambulatory Visit (INDEPENDENT_AMBULATORY_CARE_PROVIDER_SITE_OTHER): Payer: Medicare HMO

## 2023-03-28 VITALS — Ht 62.0 in | Wt 215.0 lb

## 2023-03-28 DIAGNOSIS — Z122 Encounter for screening for malignant neoplasm of respiratory organs: Secondary | ICD-10-CM

## 2023-03-28 DIAGNOSIS — Z0000 Encounter for general adult medical examination without abnormal findings: Secondary | ICD-10-CM | POA: Diagnosis not present

## 2023-03-28 NOTE — Progress Notes (Signed)
Subjective:   Maria Campbell is a 56 y.o. female who presents for Medicare Annual (Subsequent) preventive examination. I connected with  [name] on 03/28/23 by a audio enabled telemedicine application and verified that I am speaking with the correct person using two identifiers.  Patient Location: Home  Provider Location: Home Office  I discussed the limitations of evaluation and management by telemedicine. The patient expressed understanding and agreed to proceed.  Review of Systems     Cardiac Risk Factors include: advanced age (>16men, >71 women);hypertension;dyslipidemia     Objective:    Today's Vitals   03/28/23 1431  Weight: 215 lb (97.5 kg)  Height: 5\' 2"  (1.575 m)   Body mass index is 39.32 kg/m.     03/28/2023    2:35 PM 01/19/2023   10:51 AM 07/31/2022    1:29 PM 06/28/2022    8:07 AM 06/23/2022   11:07 AM 04/25/2022   10:01 AM 07/28/2021    1:19 PM  Advanced Directives  Does Patient Have a Medical Advance Directive? No No No No No No No  Would patient like information on creating a medical advance directive? No - Patient declined  No - Patient declined  No - Patient declined  No - Patient declined    Current Medications (verified) Outpatient Encounter Medications as of 03/28/2023  Medication Sig   albuterol (VENTOLIN HFA) 108 (90 Base) MCG/ACT inhaler Inhale 2 puffs into the lungs every 6 (six) hours as needed.   ANORO ELLIPTA 62.5-25 MCG/ACT AEPB INHALE 1 PUFF INTO THE LUNGS DAILY AT 6 AM.   aspirin EC 81 MG tablet Take 1 tablet (81 mg total) by mouth at bedtime.   atorvastatin (LIPITOR) 20 MG tablet Take 1 tablet (20 mg total) by mouth daily. (NEEDS TO BE SEEN BEFORE NEXT REFILL)   dapagliflozin propanediol (FARXIGA) 10 MG TABS tablet Take 1 tablet (10 mg total) by mouth daily before breakfast. (NEEDS TO BE SEEN BEFORE NEXT REFILL)   gabapentin (NEURONTIN) 300 MG capsule Take 1 capsule (300 mg total) by mouth daily.   lamoTRIgine (LAMICTAL) 200 MG tablet Take  1 tablet (200 mg total) by mouth daily. (NEEDS TO BE SEEN BEFORE NEXT REFILL)   nortriptyline (PAMELOR) 50 MG capsule Take 1 capsule (50 mg total) by mouth at bedtime.   omeprazole (PRILOSEC) 40 MG capsule Take 1 capsule (40 mg total) by mouth daily.   Vitamin D, Ergocalciferol, (DRISDOL) 1.25 MG (50000 UNIT) CAPS capsule Take 1 capsule (50,000 Units total) by mouth every 7 (seven) days.   No facility-administered encounter medications on file as of 03/28/2023.    Allergies (verified) Meperidine, Other, Bee venom, and Dakin's [sodium hypochlorite]   History: Past Medical History:  Diagnosis Date   Arthritis    Barrett's esophagus    CKD (chronic kidney disease) stage 3, GFR 30-59 ml/min (HCC)    Dyspnea    GERD (gastroesophageal reflux disease)    History of kidney stones    Hyperlipidemia    Kidney stones    Lung mass    PET negative on 05/22/18   Seizures (HCC)    Stroke (HCC)    Vitamin D deficiency    Past Surgical History:  Procedure Laterality Date   BREAST BIOPSY WITH RADIO FREQUENCY LOCALIZER Left 06/28/2022   Procedure: BREAST BIOPSY WITH RADIO FREQUENCY LOCALIZER;  Surgeon: Lewie Chamber, DO;  Location: [facility];  Service: General;  Laterality: Left;   CYSTOSCOPY W/ URETERAL STENT PLACEMENT  05/05/2021  Procedure: CYSTOSCOPY WITH LEFT URETERAL STENT REPLACEMENT;  Surgeon: Malen Gauze, MD;  Location: [facility];  Service: Urology;;   CYSTOSCOPY WITH RETROGRADE PYELOGRAM, URETEROSCOPY AND STENT PLACEMENT Left 05/05/2021   Procedure: CYSTOSCOPY WITH BILATERAL RETROGRADE PYELOGRAM, BILATERAL URETEROSCOPY,  RIGHT URETERAL STENT PLACEMENT;  Surgeon: Malen Gauze, MD;  Location: [facility];  Service: Urology;  Laterality: Left;   HIP SURGERY Left    HOLMIUM LASER APPLICATION Left 05/05/2021   Procedure: HOLMIUM LASER LITHOTRIPSY RIGHT URETERAL CALCULUS;  Surgeon: Malen Gauze, MD;  Location: [facility];  Service: Urology;  Laterality: Left;   KNEE SURGERY Left     STONE EXTRACTION WITH BASKET  05/05/2021   Procedure: LEFT URETERAL STONE EXTRACTION WITH BASKET;  Surgeon: Malen Gauze, MD;  Location: [facility];  Service: Urology;;   Family History  Problem Relation Age of Onset   Heart disease Mother    Heart attack Mother    Heart disease Father    Heart failure Father    Heart disease Maternal Grandmother    Heart disease Maternal Grandfather    Diabetes Paternal Grandmother    Breast cancer Neg Hx    Social History   Socioeconomic History   Marital status: Single    Spouse name: Not on file   Number of children: 1   Years of education: Not on file   Highest education level: 8th grade  Occupational History   Occupation: disability due to seizures and strokes  Tobacco Use   Smoking status: Every Day    Packs/day: 1.00    Years: 35.00    Additional pack years: 0.00    Total pack years: 35.00    Types: Cigarettes   Smokeless tobacco: Never  Vaping Use   Vaping Use: Never used  Substance and Sexual Activity   Alcohol use: Never   Drug use: Never   Sexual activity: Not Currently    Birth control/protection: None  Other Topics Concern   Not on file  Social History Narrative   Lives with a roommate - has one son - he lives hours away   Right handed    Social Determinants of Health   Financial Resource Strain: Low Risk  (03/28/2023)   Overall Financial Resource Strain (CARDIA)    Difficulty of Paying Living Expenses: Not hard at all  Food Insecurity: No Food Insecurity (03/28/2023)   Hunger Vital Sign    Worried About Running Out of Food in the Last Year: Never true    Ran Out of Food in the Last Year: Never true  Transportation Needs: No Transportation Needs (03/28/2023)   PRAPARE - Administrator, Civil Service (Medical): No    Lack of Transportation (Non-Medical): No  Physical Activity: Insufficiently Active (03/28/2023)   Exercise Vital Sign    Days of Exercise per Week: 3 days    Minutes of Exercise per Session:  30 min  Stress: No Stress Concern Present (03/28/2023)   [employer] of Occupational Health - Occupational Stress Questionnaire    Feeling of Stress : Not at all  Social Connections: Socially Isolated (03/28/2023)   Social Connection and Isolation Panel [NHANES]    Frequency of Communication with Friends and Family: More than three times a week    Frequency of Social Gatherings with Friends and Family: More than three times a week    Attends Religious Services: Never    Database administrator or Organizations: No    Attends Banker Meetings: Never  Marital Status: Never married    Tobacco Counseling Ready to quit: Not Answered Counseling given: Not Answered   Clinical Intake:  Pre-visit preparation completed: Yes  Pain : No/denies pain     Nutritional Risks: None Diabetes: No  How often do you need to have someone help you when you read instructions, pamphlets, or other written materials from your doctor or pharmacy?: 1 - Never  Diabetic?no   Interpreter Needed?: No  Information entered by :: Renie Ora, LPN   Activities of Daily Living    03/28/2023    2:35 PM 07/31/2022    1:29 PM  In your present state of health, do you have any difficulty performing the following activities:  Hearing? 0 1  Comment  At times  Vision? 0 0  Comment  Wears glasses  Difficulty concentrating or making decisions? 0 1  Comment  Remembering at times  Walking or climbing stairs? 0 0  Dressing or bathing? 0 0  Doing errands, shopping? 0 0  Preparing Food and eating ? N N  Using the Toilet? N N  In the past six months, have you accidently leaked urine? N N  Do you have problems with loss of bowel control? N N  Managing your Medications? N N  Managing your Finances? N N  Housekeeping or managing your Housekeeping? N Y  Comment  able to manage but it does get hard at times    Patient Care Team: Sonny Masters, FNP as PCP - General (Family Medicine) McKenzie,  Mardene Celeste, MD as Consulting Physician (Urology) Michaelle Copas, MD as Referring Physician (Optometry) Karel Jarvis Lesle Chris, MD as Consulting Physician (Neurology)  Indicate any recent Medical Services you may have received from other than [facility] providers in the past year (date may be approximate).     Assessment:   This is a routine wellness examination for Ladawna.  Hearing/Vision screen Vision Screening - Comments:: Wears rx glasses - up to date with routine eye exams with  Dr. Nedra Hai   Dietary issues and exercise activities discussed: Current Exercise Habits: The patient does not participate in regular exercise at present, Exercise limited by: orthopedic condition(s)   Goals Addressed             This Visit's Progress    AWV   On track    07/31/2022 AWV Goal: Fall Prevention  Over the next year, patient will decrease their risk for falls by: Using assistive devices, such as a cane or walker, as needed Identifying fall risks within their home and correcting them by: Removing throw rugs Adding handrails to stairs or ramps Removing clutter and keeping a clear pathway throughout the home Increasing light, especially at night Adding shower handles/bars Raising toilet seat Identifying potential personal risk factors for falls: Medication side effects Incontinence/urgency Vestibular dysfunction Hearing loss Musculoskeletal disorders Neurological disorders Orthostatic hypotension         Depression Screen    03/28/2023    2:34 PM 12/21/2022   11:03 AM 10/31/2022   12:02 PM 07/31/2022    1:32 PM 04/18/2022   11:25 AM 01/04/2022    9:59 AM 09/14/2021    8:35 AM  PHQ 2/9 Scores  PHQ - 2 Score 1 3 3  0 0 0 2  PHQ- 9 Score  11 12    8     Fall Risk    03/28/2023    2:32 PM 01/19/2023   10:51 AM 12/21/2022   11:01 AM 10/31/2022   12:02 PM  07/31/2022    1:29 PM  Fall Risk   Falls in the past year? 0 1 1 1 1   Number falls in past yr: 0 1 1 1 1   Injury with Fall? 0 1 1 1 1   Risk  for fall due to : No Fall Risks  History of fall(s)  History of fall(s)  Follow up Falls prevention discussed Falls evaluation completed Falls evaluation completed Falls prevention discussed Education provided    FALL RISK PREVENTION PERTAINING TO THE HOME:  Any stairs in or around the home? Yes  If so, are there any without handrails? No  Home free of loose throw rugs in walkways, pet beds, electrical cords, etc? Yes  Adequate lighting in your home to reduce risk of falls? Yes   ASSISTIVE DEVICES UTILIZED TO PREVENT FALLS:  Life alert? No  Use of a cane, walker or w/c? No  Grab bars in the bathroom? Yes  Shower chair or bench in shower? No  Elevated toilet seat or a handicapped toilet? No          03/28/2023    2:35 PM 07/31/2022    1:33 PM 07/28/2021    1:16 PM  6CIT Screen  What Year? 0 points 0 points 0 points  What month? 0 points 0 points 0 points  What time? 0 points 0 points 0 points  Count back from 20 0 points 2 points 0 points  Months in reverse 0 points 0 points 2 points  Repeat phrase 0 points 0 points 6 points  Total Score 0 points 2 points 8 points    Immunizations Immunization History  Administered Date(s) Administered   Zoster Recombinat (Shingrix) 01/04/2022, 10/31/2022    TDAP status: Due, Education has been provided regarding the importance of this vaccine. Advised may receive this vaccine at local pharmacy or Health Dept. Aware to provide a copy of the vaccination record if obtained from local pharmacy or Health Dept. Verbalized acceptance and understanding.  Flu Vaccine status: Declined, Education has been provided regarding the importance of this vaccine but patient still declined. Advised may receive this vaccine at local pharmacy or Health Dept. Aware to provide a copy of the vaccination record if obtained from local pharmacy or Health Dept. Verbalized acceptance and understanding.  Pneumococcal vaccine status: Declined,  Education has been provided  regarding the importance of this vaccine but patient still declined. Advised may receive this vaccine at local pharmacy or Health Dept. Aware to provide a copy of the vaccination record if obtained from local pharmacy or Health Dept. Verbalized acceptance and understanding.   Covid-19 vaccine status: Completed vaccines  Qualifies for Shingles Vaccine? Yes   Zostavax completed Yes   Shingrix Completed?: Yes  Screening Tests Health Maintenance  Topic Date Due   DTaP/Tdap/Td (1 - Tdap) Never done   PAP SMEAR-Modifier  Never done   COLONOSCOPY (Pts 45-53yrs Insurance coverage will need to be confirmed)  Never done   Lung Cancer Screening  05/06/2019   COVID-19 Vaccine (1) 04/13/2023 (Originally 04/29/1971)   INFLUENZA VACCINE  06/21/2023   Medicare Annual Wellness (AWV)  03/27/2024   MAMMOGRAM  04/18/2024   Hepatitis C Screening  Completed   HIV Screening  Completed   Zoster Vaccines- Shingrix  Completed   HPV VACCINES  Aged Out    Health Maintenance  Health Maintenance Due  Topic Date Due   DTaP/Tdap/Td (1 - Tdap) Never done   PAP SMEAR-Modifier  Never done   COLONOSCOPY (Pts 45-4yrs Insurance coverage  will need to be confirmed)  Never done   Lung Cancer Screening  05/06/2019    Colorectal cancer screening: Referral to GI placed declined with PCP . Pt aware the office will call re: appt.  Mammogram status: Completed 04/18/2022. Repeat every year  Bone Density status: Ordered not of age . Pt provided with contact info and advised to call to schedule appt.  Lung Cancer Screening: (Low Dose CT Chest recommended if Age 25-80 years, 30 pack-year currently smoking OR have quit w/in 15years.) does qualify.   Lung Cancer Screening Referral: n/a  Additional Screening:  Hepatitis C Screening: does not qualify; Completed 01/04/2022  Vision Screening: Recommended annual ophthalmology exams for early detection of glaucoma and other disorders of the eye. Is the patient up to date  with their annual eye exam?  Yes  Who is the provider or what is the name of the office in which the patient attends annual eye exams? Dr.Lee  If pt is not established with a provider, would they like to be referred to a provider to establish care? No .   Dental Screening: Recommended annual dental exams for proper oral hygiene  Community Resource Referral / Chronic Care Management: CRR required this visit?  No   CCM required this visit?  No      Plan:     I have personally reviewed and noted the following in the patient's chart:   Medical and social history Use of alcohol, tobacco or illicit drugs  Current medications and supplements including opioid prescriptions. Patient is not currently taking opioid prescriptions. Functional ability and status Nutritional status Physical activity Advanced directives List of other physicians Hospitalizations, surgeries, and ER visits in previous 12 months Vitals Screenings to include cognitive, depression, and falls Referrals and appointments  In addition, I have reviewed and discussed with patient certain preventive protocols, quality metrics, and best practice recommendations. A written personalized care plan for preventive services as well as general preventive health recommendations were provided to patient.     Lorrene Reid, LPN   11/25/1094   Nurse Notes: none

## 2023-03-28 NOTE — Patient Instructions (Signed)
Maria Campbell , Thank you for taking time to come for your Medicare Wellness Visit. I appreciate your ongoing commitment to your health goals. Please review the following plan we discussed and let me know if I can assist you in the future.   These are the goals we discussed:  Goals      AWV     07/31/2022 AWV Goal: Fall Prevention  Over the next year, patient will decrease their risk for falls by: Using assistive devices, such as a cane or walker, as needed Identifying fall risks within their home and correcting them by: Removing throw rugs Adding handrails to stairs or ramps Removing clutter and keeping a clear pathway throughout the home Increasing light, especially at night Adding shower handles/bars Raising toilet seat Identifying potential personal risk factors for falls: Medication side effects Incontinence/urgency Vestibular dysfunction Hearing loss Musculoskeletal disorders Neurological disorders Orthostatic hypotension       Exercise 3x per week (30 min per time)     Weight under 200 by next year at this time        This is a list of the screening recommended for you and due dates:  Health Maintenance  Topic Date Due   DTaP/Tdap/Td vaccine (1 - Tdap) Never done   Pap Smear  Never done   Colon Cancer Screening  Never done   Screening for Lung Cancer  05/06/2019   COVID-19 Vaccine (1) 04/13/2023*   Flu Shot  06/21/2023   Medicare Annual Wellness Visit  03/27/2024   Mammogram  04/18/2024   Hepatitis C Screening: USPSTF Recommendation to screen - Ages 18-79 yo.  Completed   HIV Screening  Completed   Zoster (Shingles) Vaccine  Completed   HPV Vaccine  Aged Out  *Topic was postponed. The date shown is not the original due date.    Advanced directives: Advance directive discussed with you today. I have provided a copy for you to complete at home and have notarized. Once this is complete please bring a copy in to our office so we can scan it into your chart.    Conditions/risks identified: Aim for 30 minutes of exercise or brisk walking, 6-8 glasses of water, and 5 servings of fruits and vegetables each day.   Next appointment: Follow up in one year for your annual wellness visit.   Preventive Care 40-64 Years, Female Preventive care refers to lifestyle choices and visits with your health care provider that can promote health and wellness. What does preventive care include? A yearly physical exam. This is also called an annual well check. Dental exams once or twice a year. Routine eye exams. Ask your health care provider how often you should have your eyes checked. Personal lifestyle choices, including: Daily care of your teeth and gums. Regular physical activity. Eating a healthy diet. Avoiding tobacco and drug use. Limiting alcohol use. Practicing safe sex. Taking low-dose aspirin daily starting at age 97. Taking vitamin and mineral supplements as recommended by your health care provider. What happens during an annual well check? The services and screenings done by your health care provider during your annual well check will depend on your age, overall health, lifestyle risk factors, and family history of disease. Counseling  Your health care provider may ask you questions about your: Alcohol use. Tobacco use. Drug use. Emotional well-being. Home and relationship well-being. Sexual activity. Eating habits. Work and work Astron[employer]. Method of birth control. Menstrual cycle. Pregnancy history. Screening  You may have the following tests or measurements:  Height, weight, and BMI. Blood pressure. Lipid and cholesterol levels. These may be checked every 5 years, or more frequently if you are over 86 years old. Skin check. Lung cancer screening. You may have this screening every year starting at age 68 if you have a 30-pack-year history of smoking and currently smoke or have quit within the past 15 years. Fecal occult blood test  (FOBT) of the stool. You may have this test every year starting at age 8. Flexible sigmoidoscopy or colonoscopy. You may have a sigmoidoscopy every 5 years or a colonoscopy every 10 years starting at age 3. Hepatitis C blood test. Hepatitis B blood test. Sexually transmitted disease (STD) testing. Diabetes screening. This is done by checking your blood sugar (glucose) after you have not eaten for a while (fasting). You may have this done every 1-3 years. Mammogram. This may be done every 1-2 years. Talk to your health care provider about when you should start having regular mammograms. This may depend on whether you have a family history of breast cancer. BRCA-related cancer screening. This may be done if you have a family history of breast, ovarian, tubal, or peritoneal cancers. Pelvic exam and Pap test. This may be done every 3 years starting at age 71. Starting at age 11, this may be done every 5 years if you have a Pap test in combination with an HPV test. Bone density scan. This is done to screen for osteoporosis. You may have this scan if you are at high risk for osteoporosis. Discuss your test results, treatment options, and if necessary, the need for more tests with your health care provider. Vaccines  Your health care provider may recommend certain vaccines, such as: Influenza vaccine. This is recommended every year. Tetanus, diphtheria, and acellular pertussis (Tdap, Td) vaccine. You may need a Td booster every 10 years. Zoster vaccine. You may need this after age 55. Pneumococcal 13-valent conjugate (PCV13) vaccine. You may need this if you have certain conditions and were not previously vaccinated. Pneumococcal polysaccharide (PPSV23) vaccine. You may need one or two doses if you smoke cigarettes or if you have certain conditions. Talk to your health care provider about which screenings and vaccines you need and how often you need them. This information is not intended to replace  advice given to you by your health care provider. Make sure you discuss any questions you have with your health care provider. Document Released: 12/03/2015 Document Revised: 07/26/2016 Document Reviewed: 09/07/2015 Elsevier Interactive Patient Education  2017 [employer].    Fall Prevention in the Home Falls can cause injuries. They can happen to people of all ages. There are many things you can do to make your home safe and to help prevent falls. What can I do on the outside of my home? Regularly fix the edges of walkways and driveways and fix any cracks. Remove anything that might make you trip as you walk through a door, such as a raised step or threshold. Trim any bushes or trees on the path to your home. Use bright outdoor lighting. Clear any walking paths of anything that might make someone trip, such as rocks or tools. Regularly check to see if handrails are loose or broken. Make sure that both sides of any steps have handrails. Any raised decks and porches should have guardrails on the edges. Have any leaves, snow, or ice cleared regularly. Use sand or salt on walking paths during winter. Clean up any spills in your garage right away. This  includes oil or grease spills. What can I do in the bathroom? Use night lights. Install grab bars by the toilet and in the tub and shower. Do not use towel bars as grab bars. Use non-skid mats or decals in the tub or shower. If you need to sit down in the shower, use a plastic, non-slip stool. Keep the floor dry. Clean up any water that spills on the floor as soon as it happens. Remove soap buildup in the tub or shower regularly. Attach bath mats securely with double-sided non-slip rug tape. Do not have throw rugs and other things on the floor that can make you trip. What can I do in the bedroom? Use night lights. Make sure that you have a light by your bed that is easy to reach. Do not use any sheets or blankets that are too big for your  bed. They should not hang down onto the floor. Have a firm chair that has side arms. You can use this for support while you get dressed. Do not have throw rugs and other things on the floor that can make you trip. What can I do in the kitchen? Clean up any spills right away. Avoid walking on wet floors. Keep items that you use a lot in easy-to-reach places. If you need to reach something above you, use a strong step stool that has a grab bar. Keep electrical cords out of the way. Do not use floor polish or wax that makes floors slippery. If you must use wax, use non-skid floor wax. Do not have throw rugs and other things on the floor that can make you trip. What can I do with my stairs? Do not leave any items on the stairs. Make sure that there are handrails on both sides of the stairs and use them. Fix handrails that are broken or loose. Make sure that handrails are as long as the stairways. Check any carpeting to make sure that it is firmly attached to the stairs. Fix any carpet that is loose or worn. Avoid having throw rugs at the top or bottom of the stairs. If you do have throw rugs, attach them to the floor with carpet tape. Make sure that you have a light switch at the top of the stairs and the bottom of the stairs. If you do not have them, ask someone to add them for you. What else can I do to help prevent falls? Wear shoes that: Do not have high heels. Have rubber bottoms. Are comfortable and fit you well. Are closed at the toe. Do not wear sandals. If you use a stepladder: Make sure that it is fully opened. Do not climb a closed stepladder. Make sure that both sides of the stepladder are locked into place. Ask someone to hold it for you, if possible. Clearly mark and make sure that you can see: Any grab bars or handrails. First and last steps. Where the edge of each step is. Use tools that help you move around (mobility aids) if they are needed. These  include: Canes. Walkers. Scooters. Crutches. Turn on the lights when you go into a dark area. Replace any light bulbs as soon as they burn out. Set up your furniture so you have a clear path. Avoid moving your furniture around. If any of your floors are uneven, fix them. If there are any pets around you, be aware of where they are. Review your medicines with your doctor. Some medicines can make you feel dizzy.  This can increase your chance of falling. Ask your doctor what other things that you can do to help prevent falls. This information is not intended to replace advice given to you by your health care provider. Make sure you discuss any questions you have with your health care provider. Document Released: 09/02/2009 Document Revised: 04/13/2016 Document Reviewed: 12/11/2014 Elsevier Interactive Patient Education  2017 [employer].

## 2023-04-02 ENCOUNTER — Ambulatory Visit (HOSPITAL_COMMUNITY): Payer: Medicare HMO | Admitting: Psychiatry

## 2023-04-19 ENCOUNTER — Ambulatory Visit (INDEPENDENT_AMBULATORY_CARE_PROVIDER_SITE_OTHER): Payer: Medicare HMO | Admitting: Family Medicine

## 2023-04-19 ENCOUNTER — Encounter: Payer: Self-pay | Admitting: Family Medicine

## 2023-04-19 VITALS — BP 106/74 | HR 91 | Temp 98.0°F | Ht 62.0 in | Wt 211.0 lb

## 2023-04-19 DIAGNOSIS — Z1212 Encounter for screening for malignant neoplasm of rectum: Secondary | ICD-10-CM

## 2023-04-19 DIAGNOSIS — Z79899 Other long term (current) drug therapy: Secondary | ICD-10-CM

## 2023-04-19 DIAGNOSIS — Z87898 Personal history of other specified conditions: Secondary | ICD-10-CM | POA: Diagnosis not present

## 2023-04-19 DIAGNOSIS — Z6838 Body mass index (BMI) 38.0-38.9, adult: Secondary | ICD-10-CM | POA: Diagnosis not present

## 2023-04-19 DIAGNOSIS — E782 Mixed hyperlipidemia: Secondary | ICD-10-CM

## 2023-04-19 DIAGNOSIS — E559 Vitamin D deficiency, unspecified: Secondary | ICD-10-CM

## 2023-04-19 DIAGNOSIS — F445 Conversion disorder with seizures or convulsions: Secondary | ICD-10-CM | POA: Diagnosis not present

## 2023-04-19 DIAGNOSIS — Z1211 Encounter for screening for malignant neoplasm of colon: Secondary | ICD-10-CM

## 2023-04-19 DIAGNOSIS — K219 Gastro-esophageal reflux disease without esophagitis: Secondary | ICD-10-CM | POA: Diagnosis not present

## 2023-04-19 DIAGNOSIS — G2581 Restless legs syndrome: Secondary | ICD-10-CM

## 2023-04-19 DIAGNOSIS — N1832 Chronic kidney disease, stage 3b: Secondary | ICD-10-CM | POA: Diagnosis not present

## 2023-04-19 LAB — CMP14+EGFR

## 2023-04-19 LAB — CBC WITH DIFFERENTIAL/PLATELET
Basos: 0 %
Immature Grans (Abs): 0 10*3/uL (ref 0.0–0.1)
Immature Granulocytes: 0 %
Lymphs: 28 %
MCHC: 32.7 g/dL (ref 31.5–35.7)
MCV: 88 fL (ref 79–97)
Neutrophils: 61 %

## 2023-04-19 LAB — LIPID PANEL

## 2023-04-19 LAB — THYROID PANEL WITH TSH

## 2023-04-19 MED ORDER — OMEPRAZOLE 40 MG PO CPDR: 40.0000 mg | DELAYED_RELEASE_CAPSULE | Freq: Every day | ORAL | 1 refills | Status: DC

## 2023-04-19 MED ORDER — ATORVASTATIN CALCIUM 20 MG PO TABS: 20.0000 mg | ORAL_TABLET | Freq: Every day | ORAL | 1 refills | Status: DC

## 2023-04-19 MED ORDER — DAPAGLIFLOZIN PROPANEDIOL 10 MG PO TABS: 10.0000 mg | ORAL_TABLET | Freq: Every day | ORAL | 1 refills | Status: DC

## 2023-04-19 MED ORDER — LAMOTRIGINE 200 MG PO TABS: 200.0000 mg | ORAL_TABLET | Freq: Every day | ORAL | 1 refills | Status: DC

## 2023-04-19 MED ORDER — GABAPENTIN 300 MG PO CAPS: 300.0000 mg | ORAL_CAPSULE | Freq: Every day | ORAL | 1 refills | Status: DC

## 2023-04-19 NOTE — Progress Notes (Signed)
Subjective:  Patient ID: Maria Campbell, female    DOB: January 16, 1966, 57 y.o.   MRN: [id number]  Patient Care Team: Sonny Masters, FNP as PCP - General (Family Medicine) Ronne Binning Mardene Celeste, MD as Consulting Physician (Urology) Michaelle Copas, MD as Referring Physician (Optometry) Karel Jarvis Lesle Chris, MD as Consulting Physician (Neurology)   Chief Complaint:  Medical Management of Chronic Issues (Chronic check up /) and Joint Swelling (Left knee x 1 1/2 weeks.  States she feels like her knee is popping )   HPI: Maria Campbell is a 57 y.o. female presenting on 04/19/2023 for Medical Management of Chronic Issues (Chronic check up /) and Joint Swelling (Left knee x 1 1/2 weeks.  States she feels like her knee is popping )    1. Mixed hyperlipidemia Compliant with medications - Yes Current medications - lipitor Side effects from medications - No Diet - general Exercise - no  2. Stage 3b chronic kidney disease (HCC) On farxiga and tolerating well. Discussed the need to follow up with neurology and psychiatry to try to get off of Lamictal due to declining renal function, states she will make an appointment.   3. History of seizure 4. Psychogenic nonepileptic seizure 5. High risk medication use Has been well controlled on current regimen. Discussed the need to follow up with neurology and psychiatry to try to get off of Lamictal due to declining renal function, states she will make an appointment.   6. Restless legs On gabapentin and tolerating well.   7. Gastroesophageal reflux disease, unspecified whether esophagitis present Compliant with medications - Yes Current medications - Prilosec Adverse side effects - No Cough - No Sore throat - No Voice change - No Hemoptysis - No Dysphagia or dyspepsia - No Water brash - No Red Flags (weight loss, hematochezia, melena, weight loss, early satiety, fevers, odynophagia, or persistent vomiting) - No  8. Vitamin D deficiency Pt is  taking oral repletion therapy. Denies bone pain and tenderness, muscle weakness, fracture, and difficulty walking. Lab Results  Component Value Date   VD25OH 12.1 (L) 10/31/2022   VD25OH 25.7 (L) 01/04/2022   VD25OH 20.1 (L) 03/17/2021   Lab Results  Component Value Date   CALCIUM 9.6 10/31/2022   PHOS 3.7 09/14/2021     9. Morbid obesity (HCC) Does not follow a healthy diet or exercise routine.      Relevant past medical, surgical, family, and social history reviewed and updated as indicated.  Allergies and medications reviewed and updated. Data reviewed: Chart in Epic.   Past Medical History:  Diagnosis Date   Arthritis    Barrett's esophagus    CKD (chronic kidney disease) stage 3, GFR 30-59 ml/min (HCC)    Dyspnea    GERD (gastroesophageal reflux disease)    History of kidney stones    Hyperlipidemia    Kidney stones    Lung mass    PET negative on 05/22/18   Seizures (HCC)    Stroke (HCC)    Vitamin D deficiency     Past Surgical History:  Procedure Laterality Date   BREAST BIOPSY WITH RADIO FREQUENCY LOCALIZER Left 06/28/2022   Procedure: BREAST BIOPSY WITH RADIO FREQUENCY LOCALIZER;  Surgeon: Lewie Chamber, DO;  Location: [facility];  Service: General;  Laterality: Left;   CYSTOSCOPY W/ URETERAL STENT PLACEMENT  05/05/2021   Procedure: CYSTOSCOPY WITH LEFT URETERAL STENT REPLACEMENT;  Surgeon: Malen Gauze, MD;  Location: [facility];  Service:  Urology;;   CYSTOSCOPY WITH RETROGRADE PYELOGRAM, URETEROSCOPY AND STENT PLACEMENT Left 05/05/2021   Procedure: CYSTOSCOPY WITH BILATERAL RETROGRADE PYELOGRAM, BILATERAL URETEROSCOPY,  RIGHT URETERAL STENT PLACEMENT;  Surgeon: Malen Gauze, MD;  Location: [facility];  Service: Urology;  Laterality: Left;   HIP SURGERY Left    HOLMIUM LASER APPLICATION Left 05/05/2021   Procedure: HOLMIUM LASER LITHOTRIPSY RIGHT URETERAL CALCULUS;  Surgeon: Malen Gauze, MD;  Location: [facility];  Service: Urology;  Laterality:  Left;   KNEE SURGERY Left    STONE EXTRACTION WITH BASKET  05/05/2021   Procedure: LEFT URETERAL STONE EXTRACTION WITH BASKET;  Surgeon: Malen Gauze, MD;  Location: [facility];  Service: Urology;;    Social History   Socioeconomic History   Marital status: Single    Spouse name: Not on file   Number of children: 1   Years of education: Not on file   Highest education level: 8th grade  Occupational History   Occupation: disability due to seizures and strokes  Tobacco Use   Smoking status: Every Day    Packs/day: 1.00    Years: 35.00    Additional pack years: 0.00    Total pack years: 35.00    Types: Cigarettes   Smokeless tobacco: Never  Vaping Use   Vaping Use: Never used  Substance and Sexual Activity   Alcohol use: Never   Drug use: Never   Sexual activity: Not Currently    Birth control/protection: None  Other Topics Concern   Not on file  Social History Narrative   Lives with a roommate - has one son - he lives hours away   Right handed    Social Determinants of Health   Financial Resource Strain: Low Risk  (03/28/2023)   Overall Financial Resource Strain (CARDIA)    Difficulty of Paying Living Expenses: Not hard at all  Food Insecurity: No Food Insecurity (03/28/2023)   Hunger Vital Sign    Worried About Running Out of Food in the Last Year: Never true    Ran Out of Food in the Last Year: Never true  Transportation Needs: No Transportation Needs (03/28/2023)   PRAPARE - Administrator, Civil Service (Medical): No    Lack of Transportation (Non-Medical): No  Physical Activity: Insufficiently Active (03/28/2023)   Exercise Vital Sign    Days of Exercise per Week: 3 days    Minutes of Exercise per Session: 30 min  Stress: No Stress Concern Present (03/28/2023)   [employer] of Occupational Health - Occupational Stress Questionnaire    Feeling of Stress : Not at all  Social Connections: Socially Isolated (03/28/2023)   Social Connection and Isolation  Panel [NHANES]    Frequency of Communication with Friends and Family: More than three times a week    Frequency of Social Gatherings with Friends and Family: More than three times a week    Attends Religious Services: Never    Database administrator or Organizations: No    Attends Banker Meetings: Never    Marital Status: Never married  Intimate Partner Violence: Not At Risk (03/28/2023)   Humiliation, Afraid, Rape, and Kick questionnaire    Fear of Current or Ex-Partner: No    Emotionally Abused: No    Physically Abused: No    Sexually Abused: No    Outpatient Encounter Medications as of 04/19/2023  Medication Sig   albuterol (VENTOLIN HFA) 108 (90 Base) MCG/ACT inhaler Inhale 2 puffs into the lungs every  6 (six) hours as needed.   ANORO ELLIPTA 62.5-25 MCG/ACT AEPB INHALE 1 PUFF INTO THE LUNGS DAILY AT 6 AM.   aspirin EC 81 MG tablet Take 1 tablet (81 mg total) by mouth at bedtime.   nortriptyline (PAMELOR) 50 MG capsule Take 1 capsule (50 mg total) by mouth at bedtime.   Vitamin D, Ergocalciferol, (DRISDOL) 1.25 MG (50000 UNIT) CAPS capsule Take 1 capsule (50,000 Units total) by mouth every 7 (seven) days.   [DISCONTINUED] atorvastatin (LIPITOR) 20 MG tablet Take 1 tablet (20 mg total) by mouth daily. (NEEDS TO BE SEEN BEFORE NEXT REFILL)   [DISCONTINUED] dapagliflozin propanediol (FARXIGA) 10 MG TABS tablet Take 1 tablet (10 mg total) by mouth daily before breakfast. (NEEDS TO BE SEEN BEFORE NEXT REFILL)   [DISCONTINUED] gabapentin (NEURONTIN) 300 MG capsule Take 1 capsule (300 mg total) by mouth daily.   [DISCONTINUED] lamoTRIgine (LAMICTAL) 200 MG tablet Take 1 tablet (200 mg total) by mouth daily. (NEEDS TO BE SEEN BEFORE NEXT REFILL)   [DISCONTINUED] omeprazole (PRILOSEC) 40 MG capsule Take 1 capsule (40 mg total) by mouth daily.   atorvastatin (LIPITOR) 20 MG tablet Take 1 tablet (20 mg total) by mouth daily.   dapagliflozin propanediol (FARXIGA) 10 MG TABS tablet Take  1 tablet (10 mg total) by mouth daily before breakfast.   gabapentin (NEURONTIN) 300 MG capsule Take 1 capsule (300 mg total) by mouth daily.   lamoTRIgine (LAMICTAL) 200 MG tablet Take 1 tablet (200 mg total) by mouth daily.   omeprazole (PRILOSEC) 40 MG capsule Take 1 capsule (40 mg total) by mouth daily.   No facility-administered encounter medications on file as of 04/19/2023.    Allergies  Allergen Reactions   Meperidine Hives and Nausea And Vomiting    Fainting and n/v (Demerol)    Other     Bleach-chest tightness/nausea & vomiting/ felt as if I can't breathe   Bee Venom    Dakin's [Sodium Hypochlorite] Nausea And Vomiting    Acitve ingredient within bleach    Review of Systems  Constitutional:  Negative for activity change, appetite change, chills, diaphoresis, fatigue, fever and unexpected weight change.  HENT: Negative.    Eyes: Negative.  Negative for visual disturbance.  Respiratory:  Negative for cough, chest tightness and shortness of breath.   Cardiovascular:  Negative for chest pain, palpitations and leg swelling.  Gastrointestinal:  Negative for abdominal pain, blood in stool, constipation, diarrhea, nausea and vomiting.  Endocrine: Negative.   Genitourinary:  Negative for difficulty urinating, dysuria, frequency and urgency.  Musculoskeletal:  Negative for arthralgias and myalgias.  Skin: Negative.   Allergic/Immunologic: Negative.   Neurological:  Negative for dizziness, tremors, seizures, syncope, facial asymmetry, speech difficulty, weakness, light-headedness, numbness and headaches.  Hematological: Negative.   Psychiatric/Behavioral:  Negative for confusion, hallucinations, sleep disturbance and suicidal ideas.   All other systems reviewed and are negative.       Objective:  BP 106/74   Pulse 91   Temp 98 F (36.7 C) (Temporal)   Ht 5\' 2"  (1.575 m)   Wt 211 lb (95.7 kg)   LMP  (LMP Unknown)   SpO2 95%   BMI 38.59 kg/m    Wt Readings from Last 3  Encounters:  04/19/23 211 lb (95.7 kg)  03/28/23 215 lb (97.5 kg)  01/19/23 211 lb 8 oz (95.9 kg)    Physical Exam Vitals and nursing note reviewed.  Constitutional:      General: She is not in acute distress.  Appearance: Normal appearance. She is well-developed and well-groomed. She is morbidly obese. She is not ill-appearing, toxic-appearing or diaphoretic.  HENT:     Head: Normocephalic and atraumatic.     Jaw: There is normal jaw occlusion.     Right Ear: Hearing normal.     Left Ear: Hearing normal.     Nose: Nose normal.     Mouth/Throat:     Lips: Pink.     Mouth: Mucous membranes are moist.     Dentition: Abnormal dentition.     Pharynx: Oropharynx is clear. Uvula midline.     Comments: Edentulous, EPS symptoms with abnormal tongue movements Eyes:     General: Lids are normal.     Extraocular Movements: Extraocular movements intact.     Conjunctiva/sclera: Conjunctivae normal.     Pupils: Pupils are equal, round, and reactive to light.  Neck:     Thyroid: No thyroid mass, thyromegaly or thyroid tenderness.     Vascular: No carotid bruit or JVD.     Trachea: Trachea and phonation normal.  Cardiovascular:     Rate and Rhythm: Normal rate and regular rhythm.     Chest Wall: PMI is not displaced.     Pulses: Normal pulses.     Heart sounds: Normal heart sounds. No murmur heard.    No friction rub. No gallop.  Pulmonary:     Effort: Pulmonary effort is normal. No respiratory distress.     Breath sounds: Normal breath sounds. No wheezing.  Abdominal:     General: Bowel sounds are normal. There is no distension or abdominal bruit.     Palpations: Abdomen is soft. There is no hepatomegaly or splenomegaly.     Tenderness: There is no abdominal tenderness. There is no right CVA tenderness or left CVA tenderness.     Hernia: No hernia is present.  Musculoskeletal:        General: Normal range of motion.     Cervical back: Normal range of motion and neck supple.      Right lower leg: No edema.     Left lower leg: No edema.  Lymphadenopathy:     Cervical: No cervical adenopathy.  Skin:    General: Skin is warm and dry.     Capillary Refill: Capillary refill takes less than 2 seconds.     Coloration: Skin is not cyanotic, jaundiced or pale.     Findings: No rash.     Comments: Scattered healing wounds to bilateral arms and legs  Neurological:     General: No focal deficit present.     Mental Status: She is alert and oriented to person, place, and time.     Sensory: Sensation is intact.     Motor: Motor function is intact.     Coordination: Coordination is intact.     Gait: Gait abnormal (slow, hunched over).     Deep Tendon Reflexes: Reflexes are normal and symmetric.  Psychiatric:        Attention and Perception: Attention and perception normal.        Mood and Affect: Mood and affect normal.        Speech: Speech normal.        Behavior: Behavior normal. Behavior is cooperative.        Thought Content: Thought content normal.        Cognition and Memory: Cognition and memory normal.        Judgment: Judgment normal.     Results for  orders placed or performed during the hospital encounter of 01/09/23  Pulmonary Function Test  Result Value Ref Range   FVC-Pre 2.76 L   FVC-%Pred-Pre 87 %   FVC-Post 2.05 L   FVC-%Pred-Post 65 %   FVC-%Change-Post -25 %   FEV1-Pre 2.31 L   FEV1-%Pred-Pre 94 %   FEV1-Post 1.34 L   FEV1-%Pred-Post 54 %   FEV1-%Change-Post -42 %   FEV6-Pre 2.76 L   FEV6-%Pred-Pre 90 %   FEV6-Post 2.03 L   FEV6-%Pred-Post 66 %   FEV6-%Change-Post -26 %   Pre FEV1/FVC ratio 84 %   FEV1FVC-%Pred-Pre 106 %   Post FEV1/FVC ratio 65 %   FEV1FVC-%Change-Post -22 %   Pre FEV6/FVC Ratio 100 %   FEV6FVC-%Pred-Pre 103 %   Post FEV6/FVC ratio 99 %   FEV6FVC-%Pred-Post 102 %   FEV6FVC-%Change-Post -1 %   FEF 25-75 Pre 2.50 L/sec   FEF2575-%Pred-Pre 105 %   DLCO unc 17.12 ml/min/mmHg   DLCO unc % pred 89 %   DL/VA 1.19  ml/min/mmHg/L   DL/VA % pred 98 %       Pertinent labs & imaging results that were available during my care of the patient were reviewed by me and considered in my medical decision making.  Assessment & Plan:  Maria Campbell was seen today for medical management of chronic issues and joint swelling.  Diagnoses and all orders for this visit:  Mixed hyperlipidemia Diet encouraged - increase intake of fresh fruits and vegetables, increase intake of lean proteins. Bake, broil, or grill foods. Avoid fried, greasy, and fatty foods. Avoid fast foods. Increase intake of fiber-rich whole grains. Exercise encouraged - at least 150 minutes per week and advance as tolerated.  Goal BMI < 25. Continue medications as prescribed. Follow up in 3-6 months as discussed.  -     atorvastatin (LIPITOR) 20 MG tablet; Take 1 tablet (20 mg total) by mouth daily. -     CMP14+EGFR -     Lipid panel -     AMB Referral to Chronic Care Management Services  Stage 3b chronic kidney disease (HCC) Discussed the need to stop certain medications t=due to declining renal function. Pt aware to follow up with neurology and psychiatry. Referral to CCM placed. Labs pending.  -     dapagliflozin propanediol (FARXIGA) 10 MG TABS tablet; Take 1 tablet (10 mg total) by mouth daily before breakfast. -     CBC with Differential/Platelet -     CMP14+EGFR -     VITAMIN D 25 Hydroxy (Vit-D Deficiency, Fractures) -     AMB Referral to Chronic Care Management Services  History of seizure Psychogenic nonepileptic seizure High risk medication use Discussed the need to follow up with neurology and psychiatry to try to get off of Lamictal due to declining renal function, states she will make an appointment.  -     lamoTRIgine (LAMICTAL) 200 MG tablet; Take 1 tablet (200 mg total) by mouth daily. -     Lamotrigine level -     Ambulatory referral to Psychiatry -     AMB Referral to Chronic Care Management Services  Restless legs Doing well on  below, will continue.  -     gabapentin (NEURONTIN) 300 MG capsule; Take 1 capsule (300 mg total) by mouth daily. -     CBC with Differential/Platelet -     AMB Referral to Chronic Care Management Services  Gastroesophageal reflux disease, unspecified whether esophagitis present No red flags present.  Diet discussed. Avoid fried, spicy, fatty, greasy, and acidic foods. Avoid caffeine, nicotine, and alcohol. Do not eat 2-3 hours before bedtime and stay upright for at least 1-2 hours after eating. Eat small frequent meals. Avoid NSAID's like motrin and aleve. Medications as prescribed. Report any new or worsening symptoms. Follow up as discussed or sooner if needed.   -     omeprazole (PRILOSEC) 40 MG capsule; Take 1 capsule (40 mg total) by mouth daily. -     CBC with Differential/Platelet -     AMB Referral to Chronic Care Management Services  Vitamin D deficiency Labs pending. Continue repletion therapy. If indicated, will change repletion dosage. Eat foods rich in Vit D including milk, orange juice, yogurt with vitamin D added, salmon or mackerel, canned tuna fish, cereals with vitamin D added, and cod liver oil. Get out in the sun but make sure to wear at least SPF 30 sunscreen.  -     VITAMIN D 25 Hydroxy (Vit-D Deficiency, Fractures) -     AMB Referral to Chronic Care Management Services  Morbid obesity (HCC) Diet and exercise encouraged.  -     CBC with Differential/Platelet -     CMP14+EGFR -     Lipid panel -     Thyroid Panel With TSH -     AMB Referral to Chronic Care Management Services  Polypharmacy -     AMB Referral to Chronic Care Management Services  Screening for colorectal cancer -     Ambulatory referral to Gastroenterology -     AMB Referral to Chronic Care Management Services     Continue all other maintenance medications.  Follow up plan: Return in about 3 months (around 07/20/2023), or if symptoms worsen or fail to improve, for chronic follow up.   Continue  healthy lifestyle choices, including diet (rich in fruits, vegetables, and lean proteins, and low in salt and simple carbohydrates) and exercise (at least 30 minutes of moderate physical activity daily).  Educational handout given for health maintenance   The above assessment and management plan was discussed with the patient. The patient verbalized understanding of and has agreed to the management plan. Patient is aware to call the clinic if they develop any new symptoms or if symptoms persist or worsen. Patient is aware when to return to the clinic for a follow-up visit. Patient educated on when it is appropriate to go to the emergency department.   Kari Baars, FNP-C [facility] [phone]

## 2023-04-20 LAB — CMP14+EGFR
ALT: 11 IU/L (ref 0–32)
AST: 12 IU/L (ref 0–40)
Albumin/Globulin Ratio: 1.6 (ref 1.2–2.2)
Albumin: 4 g/dL (ref 3.8–4.9)
Alkaline Phosphatase: 123 IU/L — ABNORMAL HIGH (ref 44–121)
BUN/Creatinine Ratio: 7 — ABNORMAL LOW (ref 9–23)
BUN: 7 mg/dL (ref 6–24)
Calcium: 9.4 mg/dL (ref 8.7–10.2)
Chloride: 103 mmol/L (ref 96–106)
Creatinine, Ser: 1.07 mg/dL — ABNORMAL HIGH (ref 0.57–1.00)
Sodium: 138 mmol/L (ref 134–144)
Total Protein: 6.5 g/dL (ref 6.0–8.5)
eGFR: 61 mL/min/{1.73_m2} (ref >59)

## 2023-04-20 LAB — CBC WITH DIFFERENTIAL/PLATELET
Basophils Absolute: 0 10*3/uL (ref 0.0–0.2)
EOS (ABSOLUTE): 0.2 10*3/uL (ref 0.0–0.4)
Eos: 4 %
Hematocrit: 38.5 % (ref 34.0–46.6)
Hemoglobin: 12.6 g/dL (ref 11.1–15.9)
Lymphocytes Absolute: 1.4 10*3/uL (ref 0.7–3.1)
MCH: 28.8 pg (ref 26.6–33.0)
Monocytes Absolute: 0.4 10*3/uL (ref 0.1–0.9)
Monocytes: 7 %
Neutrophils Absolute: 3 10*3/uL (ref 1.4–7.0)
Platelets: 182 10*3/uL (ref 150–450)
RBC: 4.37 x10E6/uL (ref 3.77–5.28)
RDW: 17 % — ABNORMAL HIGH (ref 11.7–15.4)
WBC: 4.9 10*3/uL (ref 3.4–10.8)

## 2023-04-20 LAB — THYROID PANEL WITH TSH
Free Thyroxine Index: 2 (ref 1.2–4.9)
T4, Total: 8.5 ug/dL (ref 4.5–12.0)
TSH: 2.26 u[IU]/mL (ref 0.450–4.500)

## 2023-04-20 LAB — LIPID PANEL
Chol/HDL Ratio: 4 ratio (ref 0.0–4.4)
Cholesterol, Total: 172 mg/dL (ref 100–199)
LDL Chol Calc (NIH): 104 mg/dL — ABNORMAL HIGH (ref 0–99)
Triglycerides: 143 mg/dL (ref 0–149)

## 2023-04-20 LAB — VITAMIN D 25 HYDROXY (VIT D DEFICIENCY, FRACTURES): Vit D, 25-Hydroxy: 11 ng/mL — ABNORMAL LOW (ref 30.0–100.0)

## 2023-04-20 LAB — LAMOTRIGINE LEVEL: Lamotrigine Lvl: 6.3 ug/mL (ref 2.0–20.0)

## 2023-04-24 ENCOUNTER — Telehealth: Payer: Self-pay

## 2023-04-24 ENCOUNTER — Encounter (INDEPENDENT_AMBULATORY_CARE_PROVIDER_SITE_OTHER): Payer: Self-pay | Admitting: *Deleted

## 2023-04-24 NOTE — Progress Notes (Signed)
  Chronic Care Management   Note  04/24/2023 Name: Maria Campbell MRN: [id number] DOB: Jul 06, 1966  [name] is a 57 y.o. year old female who is a primary care patient of Rakes, Doralee Albino, FNP. I reached out to [name] by phone today in response to a referral sent by Ms. Versie Starks PCP.  Ms. Sterner was given information about Chronic Care Management services today including:  CCM service includes personalized support from designated clinical staff supervised by the physician, including individualized plan of care and coordination with other care providers 24/7 contact phone numbers for assistance for urgent and routine care needs. Service will only be billed when office clinical staff spend 20 minutes or more in a month to coordinate care. Only one practitioner may furnish and bill the service in a calendar month. The patient may stop CCM services at amy time (effective at the end of the month) by phone call to the office staff. The patient will be responsible for cost sharing (co-pay) or up to 20% of the service fee (after annual deductible is met)  Ms. TAUSHA SHIFF  declinedto scheduling an appointment with the CCM RN Case Manager   Follow up plan: Patient did not agree to scheduling an appointment with the RN Case Manager, Pharm d or LCSW . The ordering provider has been notified.   Penne Lash, RMA Care Guide [facility]  [place], [place] [address] Direct Dial: [phone] [email or web address]

## 2023-05-11 ENCOUNTER — Telehealth: Payer: Self-pay | Admitting: Neurology

## 2023-05-11 ENCOUNTER — Other Ambulatory Visit: Payer: Self-pay | Admitting: Family Medicine

## 2023-05-11 NOTE — Telephone Encounter (Signed)
Pt needs a refill on the nortriptyline 50 mg to [facility] 

## 2023-05-14 ENCOUNTER — Telehealth: Payer: Self-pay | Admitting: Neurology

## 2023-05-14 MED ORDER — NORTRIPTYLINE HCL 50 MG PO CAPS: 50.0000 mg | ORAL_CAPSULE | Freq: Every day | ORAL | 0 refills | Status: DC

## 2023-05-14 NOTE — Telephone Encounter (Signed)
Refill sent in this morning.

## 2023-05-14 NOTE — Telephone Encounter (Signed)
Refill sent in for pt. 

## 2023-05-14 NOTE — Telephone Encounter (Signed)
Patient is needing Dr. Karel Jarvis to approve the refill on the Nortriptyline . /kb

## 2023-05-15 ENCOUNTER — Encounter (INDEPENDENT_AMBULATORY_CARE_PROVIDER_SITE_OTHER): Payer: Self-pay | Admitting: *Deleted

## 2023-06-15 IMAGING — US US RENAL
1 series · 14 of 25 positions shown · non-contrast
Comparison: Radiograph 04/04/2021.  CT 11/23/2020

CLINICAL DATA: Nephrolithiasis.  Bilateral lithotripsy 05/05/2021.

EXAM:
RENAL / URINARY TRACT ULTRASOUND COMPLETE

[Series 1: us renal · 0.26mm/px · 14 of 49 slices shown]
[im 1/49]
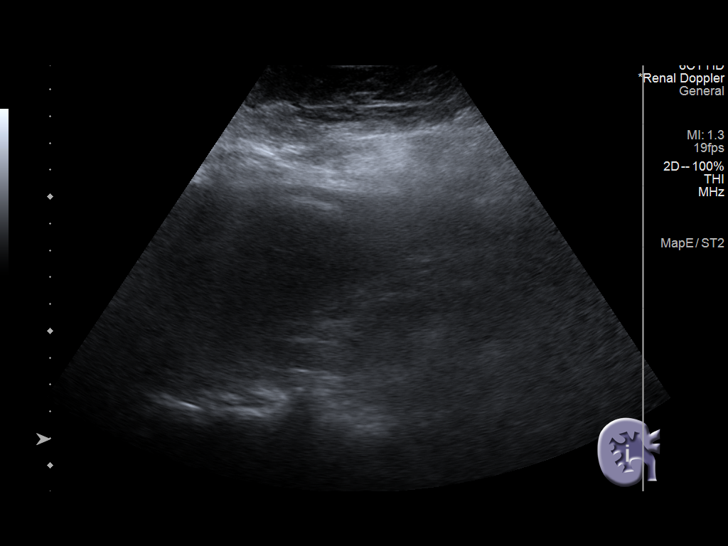
[im 5/49]
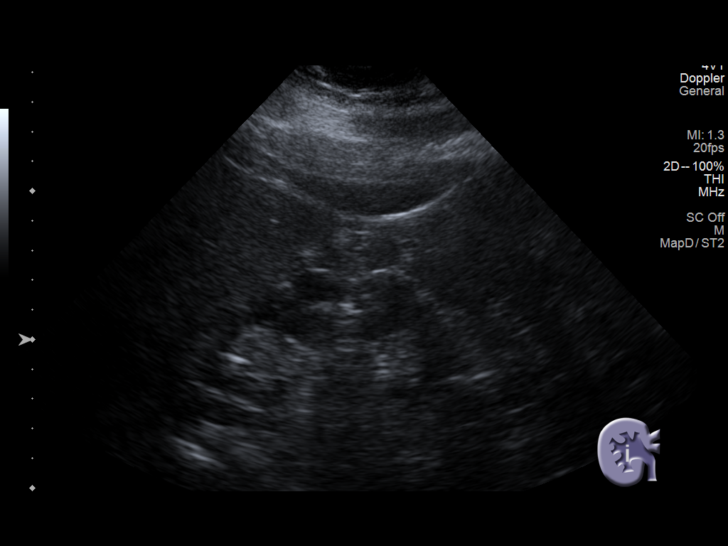
[im 9/49]
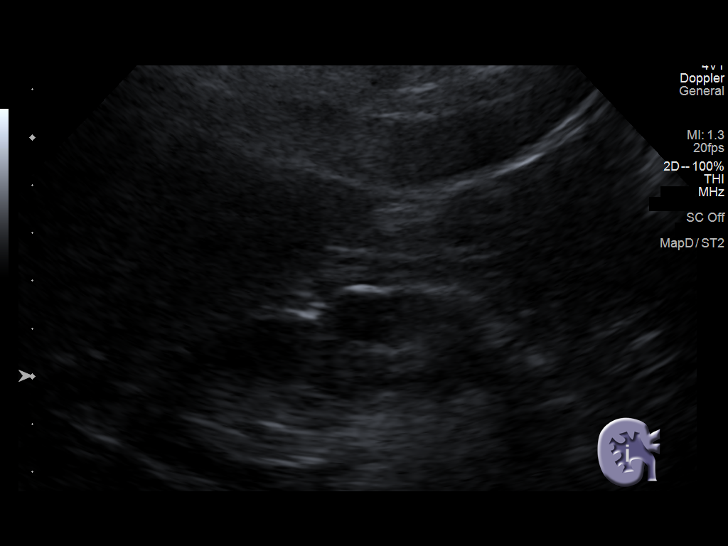
[im 13/49]
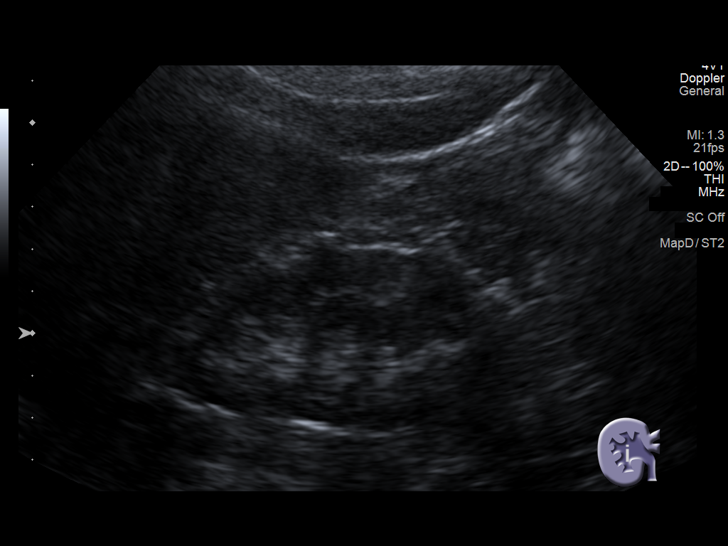
[im 17/49]
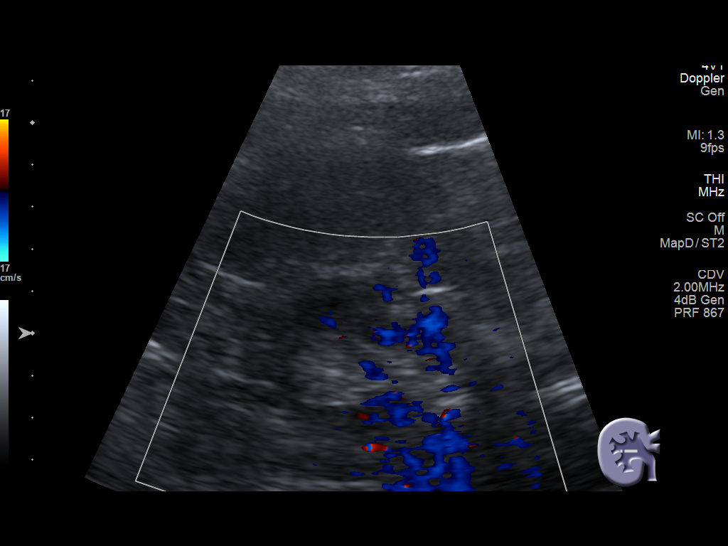
[im 19/49]
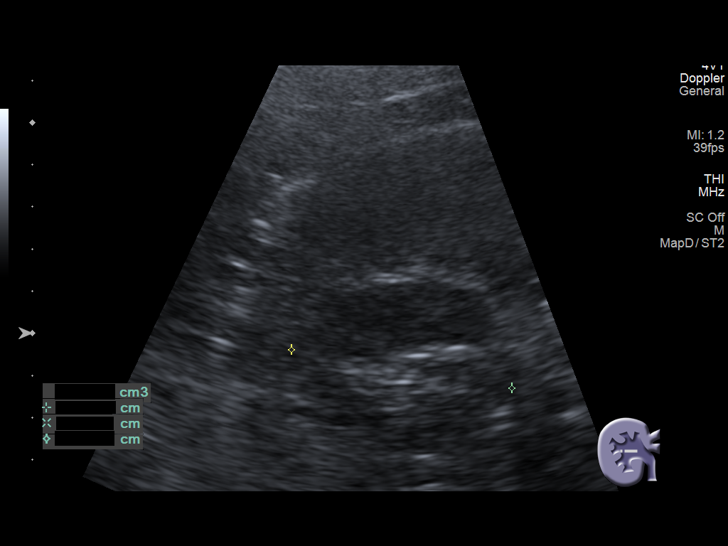
[im 23/49]
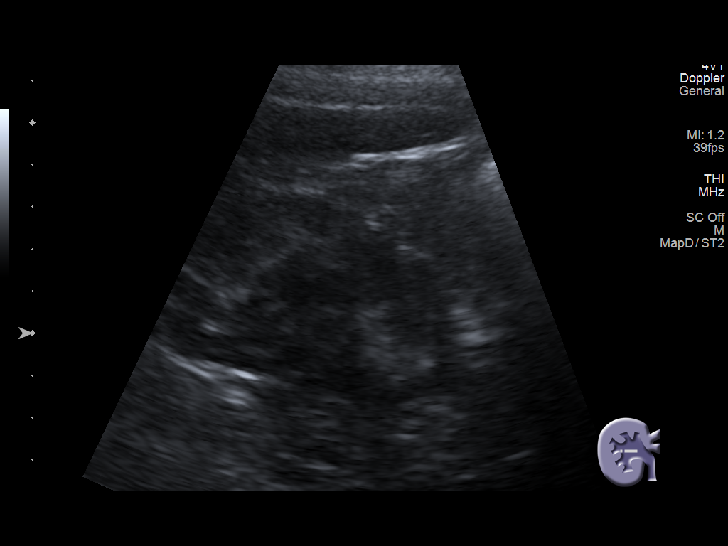
[im 27/49]
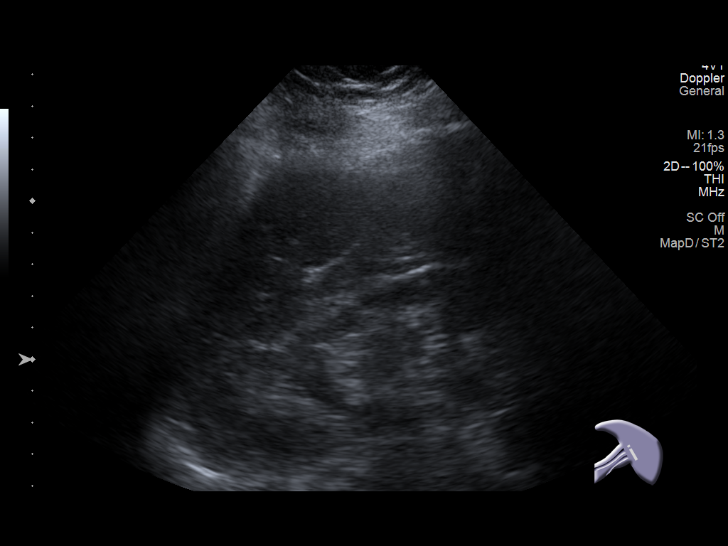
[im 31/49]
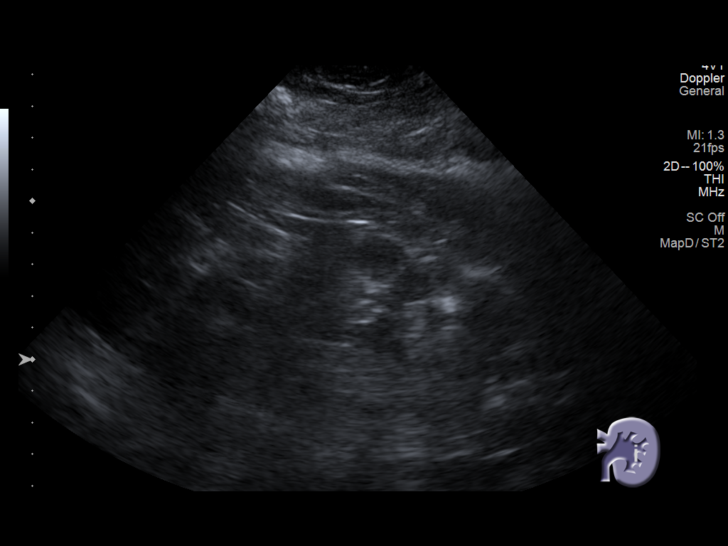
[im 33/49]
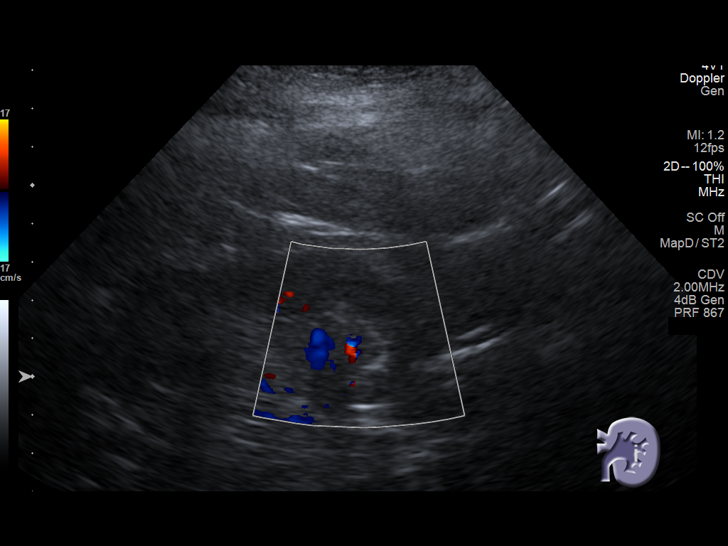
[im 37/49]
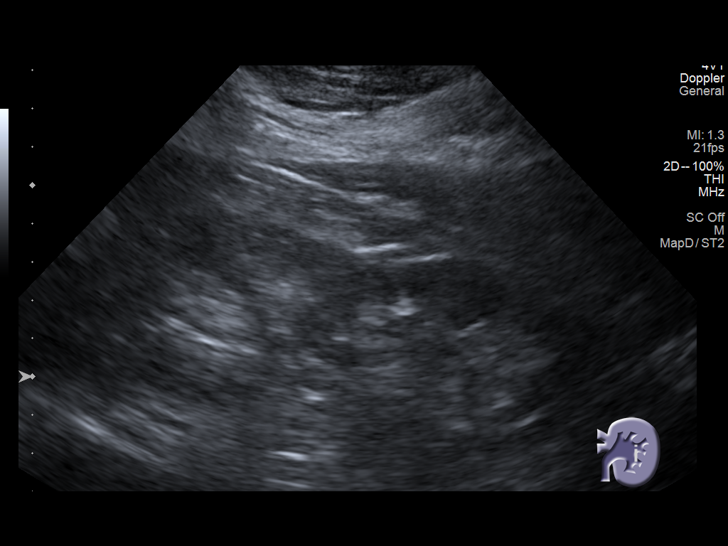
[im 41/49]
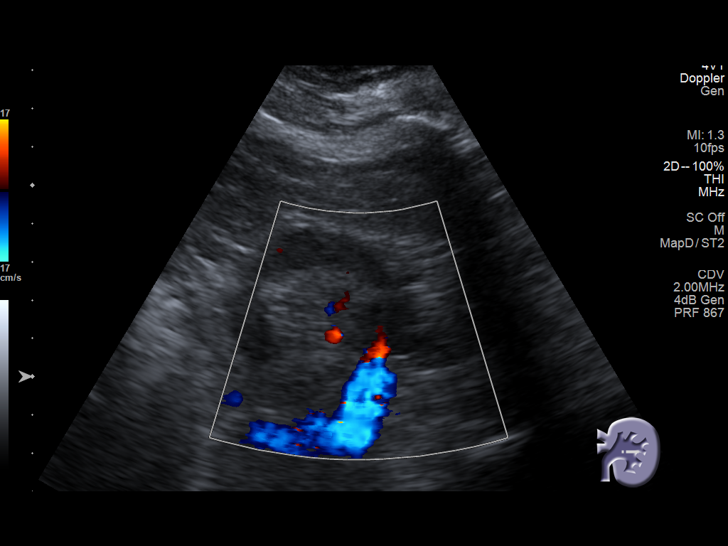
[im 45/49]
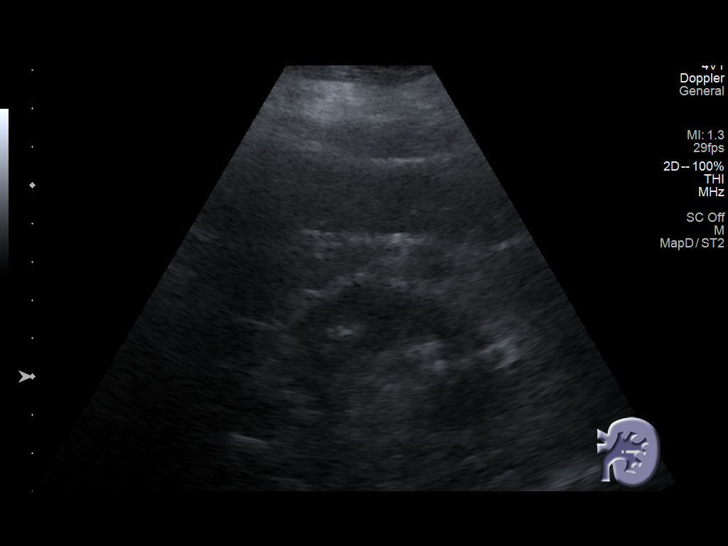
[im 49/49]
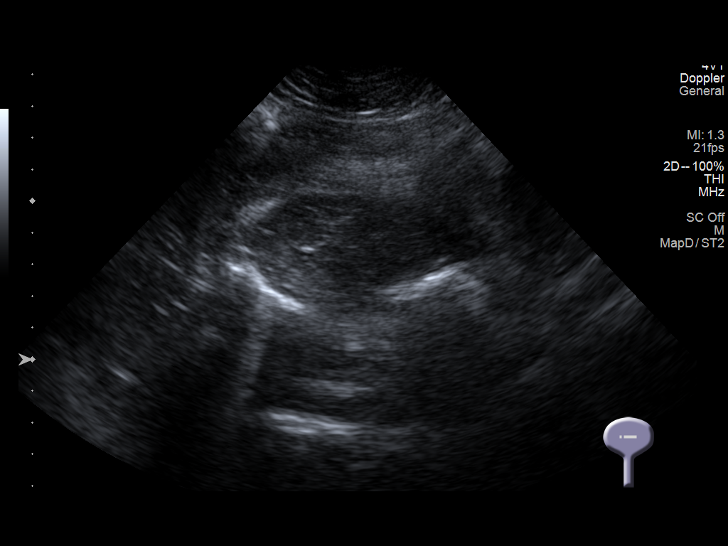

[14 of 25 positions shown; findings below may reference images not displayed]

FINDINGS: Right Kidney:

Renal measurements: 9.7 x 5.0 x 5.3 cm = volume: 135 mL. Lobulated
contour consistent with scarring. No hydronephrosis. Lower pole
calcification measures approximately 7 mm. 1.7 cm cyst in the mid
kidney. No obvious solid lesion.

Left Kidney:

Renal measurements: 11.6 x 5.3 x 4.4 cm = volume: 141 mL. Lobulated
contour consistent with scarring. No hydronephrosis. Pinargote 2
shadowing calcifications in the left kidney, larger measuring 7 mm
in the interpolar region.

Bladder:

Nondistended and not well evaluated.

Other:

Technically challenging exam due to habitus.
IMPRESSION: 1. No hydronephrosis.
2. Bilateral intrarenal calculi, 7 mm stone on the right and 2
stones on the left, largest 7 mm.
3. Right renal cyst.
4. Urinary bladder nondistended and not well evaluated.

## 2023-07-02 ENCOUNTER — Ambulatory Visit (INDEPENDENT_AMBULATORY_CARE_PROVIDER_SITE_OTHER): Payer: Medicare HMO | Admitting: Gastroenterology

## 2023-07-11 ENCOUNTER — Ambulatory Visit (INDEPENDENT_AMBULATORY_CARE_PROVIDER_SITE_OTHER): Payer: Medicare HMO | Admitting: Gastroenterology

## 2023-07-11 ENCOUNTER — Encounter (INDEPENDENT_AMBULATORY_CARE_PROVIDER_SITE_OTHER): Payer: Self-pay | Admitting: Gastroenterology

## 2023-07-11 VITALS — BP 102/64 | HR 92 | Temp 98.2°F | Ht 62.0 in | Wt 205.5 lb

## 2023-07-11 DIAGNOSIS — R197 Diarrhea, unspecified: Secondary | ICD-10-CM | POA: Diagnosis not present

## 2023-07-11 DIAGNOSIS — K219 Gastro-esophageal reflux disease without esophagitis: Secondary | ICD-10-CM | POA: Diagnosis not present

## 2023-07-11 DIAGNOSIS — R1319 Other dysphagia: Secondary | ICD-10-CM | POA: Diagnosis not present

## 2023-07-11 DIAGNOSIS — Z1211 Encounter for screening for malignant neoplasm of colon: Secondary | ICD-10-CM | POA: Diagnosis not present

## 2023-07-11 DIAGNOSIS — Z6837 Body mass index (BMI) 37.0-37.9, adult: Secondary | ICD-10-CM | POA: Diagnosis not present

## 2023-07-11 MED ORDER — POLYETHYLENE GLYCOL 3350 17 G PO PACK
17.0000 g | PACK | Freq: Two times a day (BID) | ORAL | 0 refills | Status: AC
Start: 2023-07-11 — End: 2023-10-09

## 2023-07-11 MED ORDER — PSYLLIUM 58.6 % PO PACK
1.0000 | PACK | Freq: Two times a day (BID) | ORAL | 2 refills | Status: AC
Start: 2023-07-11 — End: 2023-10-09

## 2023-07-11 MED ORDER — PEG 3350-KCL-NA BICARB-NACL 420 G PO SOLR: 4000.0000 mL | Freq: Once | ORAL | 0 refills | Status: AC

## 2023-07-11 NOTE — H&P (View-Only) (Signed)
Maria Campbell , M.D. Gastroenterology & Hepatology [facility] Gastroenterology [address] [place], [place] [address] Primary Care Physician: Sonny Masters, FNP [address] [place] [address]  Chief Complaint: Colon cancer screening, abdominal pain with altered bowel movement ,GERD and dysphagia  History of Present Illness:  Maria Campbell is a 57 y.o. female CVA, seizures, GERD, CKD who presents for evaluation of Colon cancer screening, abdominal pain with altered bowel movement ,GERD and dysphagia.  Patient reports that for past few months she has altered bowel movement which she reports as alternating between hard stools and liquid stool.  She mostly had diarrhea 3-4 times a day but would have days where she would not defecate at all.  She has days where she has to strain a lot without any bowel.   Reports that sometimes eating meat is difficult as she has to chop it up in small pieces and feels as if she is in a choke and will get stuck in middle of her chest.  Patient reports that acid reflux and takes PPI as needed  Patient has diffuse abdominal pain dull ache like nonradiating improves after defecation present at least 3 times a week.  Reports last colonoscopy was 6 years ago but was unable to determine adequate prep and she never had a full colonoscopy at  Patient was found to have diffuse excoriation and upon questioning reports because of her cat she has to scratch in place with skin  The patient denies having any nausea, vomiting, fever, chills, hematochezia, melena, hematemesis, , jaundice, pruritus or weight loss.  Last GNF:AOZH Last Colonoscopy:  Reports last colonoscopy was 6 years ago but was unable to determine adequate prep and she never had a full colonoscopy at  FHx: neg for any gastrointestinal/liver disease, no malignancies Social: neg smoking, alcohol or illicit drug use Surgical: no abdominal surgeries  Past  Medical History: Past Medical History:  Diagnosis Date   Arthritis    Barrett's esophagus    CKD (chronic kidney disease) stage 3, GFR 30-59 ml/min (HCC)    Dyspnea    GERD (gastroesophageal reflux disease)    History of kidney stones    Hyperlipidemia    Kidney stones    Lung mass    PET negative on 05/22/18   Seizures (HCC)    Stroke (HCC)    Vitamin D deficiency     Past Surgical History: Past Surgical History:  Procedure Laterality Date   BREAST BIOPSY WITH RADIO FREQUENCY LOCALIZER Left 06/28/2022   Procedure: BREAST BIOPSY WITH RADIO FREQUENCY LOCALIZER;  Surgeon: Theophilus Kinds A, DO;  Location: [facility];  Service: General;  Laterality: Left;   CYSTOSCOPY W/ URETERAL STENT PLACEMENT  05/05/2021   Procedure: CYSTOSCOPY WITH LEFT URETERAL STENT REPLACEMENT;  Surgeon: Malen Gauze, MD;  Location: [facility];  Service: Urology;;   CYSTOSCOPY WITH RETROGRADE PYELOGRAM, URETEROSCOPY AND STENT PLACEMENT Left 05/05/2021   Procedure: CYSTOSCOPY WITH BILATERAL RETROGRADE PYELOGRAM, BILATERAL URETEROSCOPY,  RIGHT URETERAL STENT PLACEMENT;  Surgeon: Malen Gauze, MD;  Location: [facility];  Service: Urology;  Laterality: Left;   HIP SURGERY Left    HOLMIUM LASER APPLICATION Left 05/05/2021   Procedure: HOLMIUM LASER LITHOTRIPSY RIGHT URETERAL CALCULUS;  Surgeon: Malen Gauze, MD;  Location: [facility];  Service: Urology;  Laterality: Left;   KNEE SURGERY Left    STONE EXTRACTION WITH BASKET  05/05/2021   Procedure: LEFT URETERAL STONE EXTRACTION WITH BASKET;  Surgeon: Wilkie Aye  L, MD;  Location: [facility];  Service: Urology;;    Family History: Family History  Problem Relation Age of Onset   Heart disease Mother    Heart attack Mother    Heart disease Father    Heart failure Father    Heart disease Maternal Grandmother    Heart disease Maternal Grandfather    Diabetes Paternal Grandmother    Breast cancer Neg Hx     Social History: Social History   Tobacco Use   Smoking Status Every Day   Current packs/day: 1.00   Average packs/day: 1 pack/day for 35.0 years (35.0 ttl pk-yrs)   Types: Cigarettes  Smokeless Tobacco Never   Social History   Substance and Sexual Activity  Alcohol Use Never   Social History   Substance and Sexual Activity  Drug Use Never    Allergies: Allergies  Allergen Reactions   Meperidine Hives and Nausea And Vomiting    Fainting and n/v (Demerol)    Other     Bleach-chest tightness/nausea & vomiting/ felt as if I can't breathe   Bee Venom    Dakin's [Sodium Hypochlorite] Nausea And Vomiting    Acitve ingredient within bleach    Medications: Current Outpatient Medications  Medication Sig Dispense Refill   albuterol (VENTOLIN HFA) 108 (90 Base) MCG/ACT inhaler Inhale 2 puffs into the lungs every 6 (six) hours as needed. 18 g 0   ANORO ELLIPTA 62.5-25 MCG/ACT AEPB INHALE 1 PUFF INTO THE LUNGS DAILY AT 6 AM. 60 each 0   aspirin EC 81 MG tablet Take 1 tablet (81 mg total) by mouth at bedtime. 30 tablet 12   atorvastatin (LIPITOR) 20 MG tablet Take 1 tablet (20 mg total) by mouth daily. 90 tablet 1   dapagliflozin propanediol (FARXIGA) 10 MG TABS tablet Take 1 tablet (10 mg total) by mouth daily before breakfast. 90 tablet 1   gabapentin (NEURONTIN) 300 MG capsule Take 1 capsule (300 mg total) by mouth daily. 90 capsule 1   lamoTRIgine (LAMICTAL) 200 MG tablet Take 1 tablet (200 mg total) by mouth daily. 90 tablet 1   nortriptyline (PAMELOR) 50 MG capsule Take 1 capsule (50 mg total) by mouth at bedtime. 90 capsule 0   omeprazole (PRILOSEC) 40 MG capsule Take 1 capsule (40 mg total) by mouth daily. 90 capsule 1   polyethylene glycol (MIRALAX / GLYCOLAX) 17 g packet Take 17 g by mouth 2 (two) times daily. 180 packet 0   polyethylene glycol-electrolytes (NULYTELY) 420 g solution Take 4,000 mLs by mouth once for 1 dose. 4000 mL 0   psyllium (METAMUCIL) 58.6 % packet Take 1 packet by mouth 2 (two) times daily. 60 packet  2   Vitamin D, Ergocalciferol, (DRISDOL) 1.25 MG (50000 UNIT) CAPS capsule Take 1 capsule (50,000 Units total) by mouth every 7 (seven) days. 5 capsule 3   No current facility-administered medications for this visit.    Review of Systems: GENERAL: negative for malaise, night sweats HEENT: No changes in hearing or vision, no nose bleeds or other nasal problems. NECK: Negative for lumps, goiter, pain and significant neck swelling RESPIRATORY: Negative for cough, wheezing CARDIOVASCULAR: Negative for chest pain, leg swelling, palpitations, orthopnea GI: SEE HPI MUSCULOSKELETAL: Negative for joint pain or swelling, back pain, and muscle pain. SKIN: Negative for lesions, rash HEMATOLOGY Negative for prolonged bleeding, bruising easily, and swollen nodes. ENDOCRINE: Negative for cold or heat intolerance, polyuria, polydipsia and goiter. NEURO: negative for tremor, gait imbalance, syncope and seizures. The remainder  of the review of systems is noncontributory.   Physical Exam: BP 102/64 (BP Location: Left Arm, Patient Position: Sitting, Cuff Size: Large)   Pulse 92   Temp 98.2 F (36.8 C) (Oral)   Ht 5\' 2"  (1.575 m)   Wt 205 lb 8 oz (93.2 kg)   LMP  (LMP Unknown)   BMI 37.59 kg/m  GENERAL: The patient is AO x3, in no acute distress.  Excoriation on extremities and abdomen HEENT: Head is normocephalic and atraumatic. EOMI are intact. Mouth is well hydrated and without lesions. NECK: Supple. No masses LUNGS: Clear to auscultation. No presence of rhonchi/wheezing/rales. Adequate chest expansion HEART: RRR, normal s1 and s2. ABDOMEN: Soft, positive epigastric tenderness, no guarding, no peritoneal signs, and nondistended. BS +. No masses. EXTREMITIES: Without any cyanosis, clubbing, rash, lesions or edema. NEUROLOGIC: AOx3, no focal motor deficit. SKIN: no jaundice, no rashes   Imaging/Labs: as above  I personally reviewed and interpreted the available labs, imaging and endoscopic  files.  Impression and Plan:   Maria Campbell is a 57 y.o. female CVA, seizures, GERD, CKD who presents for evaluation of Colon cancer screening, abdominal pain with altered bowel movement ,GERD and dysphagia.  #Dysphagia Patient has solid food dysphagia mostly with meat and has to chop her food.  She has chronic GERD and it is imperative to proceed with diagnostic endoscopy to evaluate for any peptic stricture and rule out EOE  On exam patient has epigastric tenderness hence another indication to proceed with upper endoscopy to rule out peptic ulcer disease  Upper endoscopy with esophageal biopsies plus and minus dilation  #GERD This is exacerbated by BMI of 37  PPI 40 mg, advised to take 30 min before breakfast 1) Avoid coffee, tea, cola beverages, carbonated beverages, spicy foods, greasy foods, foods high in acid content (e.g. tomatoes and citrus fruits), chocolate, and peppermint 2) Avoid drinking alcoholic beverages 3) Avoid smoking 4) Eat small meals and keep weight within normal range 5) Avoid recumbent posture for 3 hours post-prandially 6) Elevate head of bed  #Abdominal pain with altered bowel movements  Patient has altered bowel movement with mostly diarrhea with constipation and much straining.  This could be overflow diarrhea or IBS mixed type  She does have abdominal pain which improves with defecation present at least 3 times a week for past 3 months hence qualifies for Rome IV for criteria for IBS  It is low in fiber.  Last colonoscopy was poor prep with hard stools per patient hence patient could be chronically constipated  Abdominal x-ray to evaluate for stool burden Ensure adequate fluid intake: Aim for 8 glasses of water daily. Follow a high fiber diet: Include foods such as dates, prunes, pears, and kiwi. Take Miralax twice a day for the first week, then reduce to once daily thereafter. Use Metamucil twice a day.  #CRC Screening  Reports last colonoscopy  was 6 years ago but was noted due to poor prep hence she never had a full colonoscopy   Will proceed with colorectal cancer screening colonoscopy   All questions were answered.      Maria Lawman, MD Gastroenterology and Hepatology [facility] Gastroenterology   This chart has been completed using [facility] Dictation software, and while attempts have been made to ensure accuracy , certain words and phrases may not be transcribed as intended

## 2023-07-11 NOTE — Progress Notes (Signed)
Vista Lawman , M.D. Gastroenterology & Hepatology [facility] Gastroenterology [address] [place], [place] [address] Primary Care Physician: Sonny Masters, FNP [address] [place] [address]  Chief Complaint: Colon cancer screening, abdominal pain with altered bowel movement ,GERD and dysphagia  History of Present Illness:  Maria Campbell is a 57 y.o. female CVA, seizures, GERD, CKD who presents for evaluation of Colon cancer screening, abdominal pain with altered bowel movement ,GERD and dysphagia.  Patient reports that for past few months she has altered bowel movement which she reports as alternating between hard stools and liquid stool.  She mostly had diarrhea 3-4 times a day but would have days where she would not defecate at all.  She has days where she has to strain a lot without any bowel.   Reports that sometimes eating meat is difficult as she has to chop it up in small pieces and feels as if she is in a choke and will get stuck in middle of her chest.  Patient reports that acid reflux and takes PPI as needed  Patient has diffuse abdominal pain dull ache like nonradiating improves after defecation present at least 3 times a week.  Reports last colonoscopy was 6 years ago but was unable to determine adequate prep and she never had a full colonoscopy at  Patient was found to have diffuse excoriation and upon questioning reports because of her cat she has to scratch in place with skin  The patient denies having any nausea, vomiting, fever, chills, hematochezia, melena, hematemesis, , jaundice, pruritus or weight loss.  Last GNF:AOZH Last Colonoscopy:  Reports last colonoscopy was 6 years ago but was unable to determine adequate prep and she never had a full colonoscopy at  FHx: neg for any gastrointestinal/liver disease, no malignancies Social: neg smoking, alcohol or illicit drug use Surgical: no abdominal surgeries  Past  Medical History: Past Medical History:  Diagnosis Date   Arthritis    Barrett's esophagus    CKD (chronic kidney disease) stage 3, GFR 30-59 ml/min (HCC)    Dyspnea    GERD (gastroesophageal reflux disease)    History of kidney stones    Hyperlipidemia    Kidney stones    Lung mass    PET negative on 05/22/18   Seizures (HCC)    Stroke (HCC)    Vitamin D deficiency     Past Surgical History: Past Surgical History:  Procedure Laterality Date   BREAST BIOPSY WITH RADIO FREQUENCY LOCALIZER Left 06/28/2022   Procedure: BREAST BIOPSY WITH RADIO FREQUENCY LOCALIZER;  Surgeon: Theophilus Kinds A, DO;  Location: [facility];  Service: General;  Laterality: Left;   CYSTOSCOPY W/ URETERAL STENT PLACEMENT  05/05/2021   Procedure: CYSTOSCOPY WITH LEFT URETERAL STENT REPLACEMENT;  Surgeon: Malen Gauze, MD;  Location: [facility];  Service: Urology;;   CYSTOSCOPY WITH RETROGRADE PYELOGRAM, URETEROSCOPY AND STENT PLACEMENT Left 05/05/2021   Procedure: CYSTOSCOPY WITH BILATERAL RETROGRADE PYELOGRAM, BILATERAL URETEROSCOPY,  RIGHT URETERAL STENT PLACEMENT;  Surgeon: Malen Gauze, MD;  Location: [facility];  Service: Urology;  Laterality: Left;   HIP SURGERY Left    HOLMIUM LASER APPLICATION Left 05/05/2021   Procedure: HOLMIUM LASER LITHOTRIPSY RIGHT URETERAL CALCULUS;  Surgeon: Malen Gauze, MD;  Location: [facility];  Service: Urology;  Laterality: Left;   KNEE SURGERY Left    STONE EXTRACTION WITH BASKET  05/05/2021   Procedure: LEFT URETERAL STONE EXTRACTION WITH BASKET;  Surgeon: Wilkie Aye  L, MD;  Location: [facility];  Service: Urology;;    Family History: Family History  Problem Relation Age of Onset   Heart disease Mother    Heart attack Mother    Heart disease Father    Heart failure Father    Heart disease Maternal Grandmother    Heart disease Maternal Grandfather    Diabetes Paternal Grandmother    Breast cancer Neg Hx     Social History: Social History   Tobacco Use   Smoking Status Every Day   Current packs/day: 1.00   Average packs/day: 1 pack/day for 35.0 years (35.0 ttl pk-yrs)   Types: Cigarettes  Smokeless Tobacco Never   Social History   Substance and Sexual Activity  Alcohol Use Never   Social History   Substance and Sexual Activity  Drug Use Never    Allergies: Allergies  Allergen Reactions   Meperidine Hives and Nausea And Vomiting    Fainting and n/v (Demerol)    Other     Bleach-chest tightness/nausea & vomiting/ felt as if I can't breathe   Bee Venom    Dakin's [Sodium Hypochlorite] Nausea And Vomiting    Acitve ingredient within bleach    Medications: Current Outpatient Medications  Medication Sig Dispense Refill   albuterol (VENTOLIN HFA) 108 (90 Base) MCG/ACT inhaler Inhale 2 puffs into the lungs every 6 (six) hours as needed. 18 g 0   ANORO ELLIPTA 62.5-25 MCG/ACT AEPB INHALE 1 PUFF INTO THE LUNGS DAILY AT 6 AM. 60 each 0   aspirin EC 81 MG tablet Take 1 tablet (81 mg total) by mouth at bedtime. 30 tablet 12   atorvastatin (LIPITOR) 20 MG tablet Take 1 tablet (20 mg total) by mouth daily. 90 tablet 1   dapagliflozin propanediol (FARXIGA) 10 MG TABS tablet Take 1 tablet (10 mg total) by mouth daily before breakfast. 90 tablet 1   gabapentin (NEURONTIN) 300 MG capsule Take 1 capsule (300 mg total) by mouth daily. 90 capsule 1   lamoTRIgine (LAMICTAL) 200 MG tablet Take 1 tablet (200 mg total) by mouth daily. 90 tablet 1   nortriptyline (PAMELOR) 50 MG capsule Take 1 capsule (50 mg total) by mouth at bedtime. 90 capsule 0   omeprazole (PRILOSEC) 40 MG capsule Take 1 capsule (40 mg total) by mouth daily. 90 capsule 1   polyethylene glycol (MIRALAX / GLYCOLAX) 17 g packet Take 17 g by mouth 2 (two) times daily. 180 packet 0   polyethylene glycol-electrolytes (NULYTELY) 420 g solution Take 4,000 mLs by mouth once for 1 dose. 4000 mL 0   psyllium (METAMUCIL) 58.6 % packet Take 1 packet by mouth 2 (two) times daily. 60 packet  2   Vitamin D, Ergocalciferol, (DRISDOL) 1.25 MG (50000 UNIT) CAPS capsule Take 1 capsule (50,000 Units total) by mouth every 7 (seven) days. 5 capsule 3   No current facility-administered medications for this visit.    Review of Systems: GENERAL: negative for malaise, night sweats HEENT: No changes in hearing or vision, no nose bleeds or other nasal problems. NECK: Negative for lumps, goiter, pain and significant neck swelling RESPIRATORY: Negative for cough, wheezing CARDIOVASCULAR: Negative for chest pain, leg swelling, palpitations, orthopnea GI: SEE HPI MUSCULOSKELETAL: Negative for joint pain or swelling, back pain, and muscle pain. SKIN: Negative for lesions, rash HEMATOLOGY Negative for prolonged bleeding, bruising easily, and swollen nodes. ENDOCRINE: Negative for cold or heat intolerance, polyuria, polydipsia and goiter. NEURO: negative for tremor, gait imbalance, syncope and seizures. The remainder  of the review of systems is noncontributory.   Physical Exam: BP 102/64 (BP Location: Left Arm, Patient Position: Sitting, Cuff Size: Large)   Pulse 92   Temp 98.2 F (36.8 C) (Oral)   Ht 5\' 2"  (1.575 m)   Wt 205 lb 8 oz (93.2 kg)   LMP  (LMP Unknown)   BMI 37.59 kg/m  GENERAL: The patient is AO x3, in no acute distress.  Excoriation on extremities and abdomen HEENT: Head is normocephalic and atraumatic. EOMI are intact. Mouth is well hydrated and without lesions. NECK: Supple. No masses LUNGS: Clear to auscultation. No presence of rhonchi/wheezing/rales. Adequate chest expansion HEART: RRR, normal s1 and s2. ABDOMEN: Soft, positive epigastric tenderness, no guarding, no peritoneal signs, and nondistended. BS +. No masses. EXTREMITIES: Without any cyanosis, clubbing, rash, lesions or edema. NEUROLOGIC: AOx3, no focal motor deficit. SKIN: no jaundice, no rashes   Imaging/Labs: as above  I personally reviewed and interpreted the available labs, imaging and endoscopic  files.  Impression and Plan:   Maria Campbell is a 57 y.o. female CVA, seizures, GERD, CKD who presents for evaluation of Colon cancer screening, abdominal pain with altered bowel movement ,GERD and dysphagia.  #Dysphagia Patient has solid food dysphagia mostly with meat and has to chop her food.  She has chronic GERD and it is imperative to proceed with diagnostic endoscopy to evaluate for any peptic stricture and rule out EOE  On exam patient has epigastric tenderness hence another indication to proceed with upper endoscopy to rule out peptic ulcer disease  Upper endoscopy with esophageal biopsies plus and minus dilation  #GERD This is exacerbated by BMI of 37  PPI 40 mg, advised to take 30 min before breakfast 1) Avoid coffee, tea, cola beverages, carbonated beverages, spicy foods, greasy foods, foods high in acid content (e.g. tomatoes and citrus fruits), chocolate, and peppermint 2) Avoid drinking alcoholic beverages 3) Avoid smoking 4) Eat small meals and keep weight within normal range 5) Avoid recumbent posture for 3 hours post-prandially 6) Elevate head of bed  #Abdominal pain with altered bowel movements  Patient has altered bowel movement with mostly diarrhea with constipation and much straining.  This could be overflow diarrhea or IBS mixed type  She does have abdominal pain which improves with defecation present at least 3 times a week for past 3 months hence qualifies for Rome IV for criteria for IBS  It is low in fiber.  Last colonoscopy was poor prep with hard stools per patient hence patient could be chronically constipated  Abdominal x-ray to evaluate for stool burden Ensure adequate fluid intake: Aim for 8 glasses of water daily. Follow a high fiber diet: Include foods such as dates, prunes, pears, and kiwi. Take Miralax twice a day for the first week, then reduce to once daily thereafter. Use Metamucil twice a day.  #CRC Screening  Reports last colonoscopy  was 6 years ago but was noted due to poor prep hence she never had a full colonoscopy   Will proceed with colorectal cancer screening colonoscopy   All questions were answered.      Vista Lawman, MD Gastroenterology and Hepatology [facility] Gastroenterology   This chart has been completed using [facility] Dictation software, and while attempts have been made to ensure accuracy , certain words and phrases may not be transcribed as intended

## 2023-07-11 NOTE — Patient Instructions (Signed)
It was very nice to meet you today, as dicussed with will plan for the following :  1) Upper endoscopy and colonoscopy  2) Xray abdomen ,  Ensure adequate fluid intake: Aim for 8 glasses of water daily. Follow a high fiber diet: Include foods such as dates, prunes, pears, and kiwi. Take Miralax twice a day for the first week, then reduce to once daily thereafter. Use Metamucil twice a day.

## 2023-07-16 ENCOUNTER — Encounter (INDEPENDENT_AMBULATORY_CARE_PROVIDER_SITE_OTHER): Payer: Self-pay

## 2023-07-25 ENCOUNTER — Ambulatory Visit (INDEPENDENT_AMBULATORY_CARE_PROVIDER_SITE_OTHER): Payer: Medicare HMO | Admitting: Neurology

## 2023-07-25 ENCOUNTER — Other Ambulatory Visit: Payer: Medicare HMO

## 2023-07-25 ENCOUNTER — Encounter: Payer: Self-pay | Admitting: Neurology

## 2023-07-25 VITALS — BP 95/71 | HR 102 | Ht 62.0 in | Wt 204.6 lb

## 2023-07-25 DIAGNOSIS — E559 Vitamin D deficiency, unspecified: Secondary | ICD-10-CM

## 2023-07-25 DIAGNOSIS — Z87898 Personal history of other specified conditions: Secondary | ICD-10-CM | POA: Diagnosis not present

## 2023-07-25 DIAGNOSIS — G43709 Chronic migraine without aura, not intractable, without status migrainosus: Secondary | ICD-10-CM | POA: Diagnosis not present

## 2023-07-25 DIAGNOSIS — N1832 Chronic kidney disease, stage 3b: Secondary | ICD-10-CM

## 2023-07-25 DIAGNOSIS — R202 Paresthesia of skin: Secondary | ICD-10-CM | POA: Diagnosis not present

## 2023-07-25 LAB — VITAMIN B12: Vitamin B-12: 78 pg/mL — ABNORMAL LOW (ref 211–911)

## 2023-07-25 LAB — VITAMIN D 25 HYDROXY (VIT D DEFICIENCY, FRACTURES): VITD: 12.86 ng/mL — ABNORMAL LOW (ref 30.00–100.00)

## 2023-07-25 MED ORDER — NORTRIPTYLINE HCL 50 MG PO CAPS: 50.0000 mg | ORAL_CAPSULE | Freq: Every day | ORAL | 3 refills | Status: DC

## 2023-07-25 MED ORDER — LAMOTRIGINE 100 MG PO TABS: ORAL_TABLET | ORAL | 3 refills | Status: DC
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
## 2023-07-25 NOTE — Patient Instructions (Signed)
Good to see you.  Have bloodwork done for vitamin D and vitamin B12  2. Reduce Lamotrigine to 100mg : Take 1 tablet every night  3. Continue to monitor kidney function with PCP as we reduce Lamotrigine dose  4. Continue nortriptyline 50mg  every night  5. Follow-up in 6 months, call for any changes

## 2023-07-26 ENCOUNTER — Telehealth (INDEPENDENT_AMBULATORY_CARE_PROVIDER_SITE_OTHER): Payer: Self-pay | Admitting: Gastroenterology

## 2023-07-26 NOTE — Patient Instructions (Addendum)
Maria Campbell  07/26/2023     @PREFPERIOPPHARMACY @   Your procedure is scheduled on  08/02/2023.   Report to [facility] at  0730  A.M.   Call this number if you have problems the morning of surgery:  [phone]  If you experience any cold or flu symptoms such as cough, fever, chills, shortness of breath, etc. between now and your scheduled surgery, please notify us at the above number.   Remember:  Follow the diet and prep instructions given to you by the office.      Your last dose of farxiga should be on 07/29/2023.     Take these medicines the morning of surgery with A SIP OF WATER                                    gabapentin, omeprazole.     Do not wear jewelry, make-up or nail polish, including gel polish,  artificial nails, or any other type of covering on natural nails (fingers and  toes).  Do not wear lotions, powders, or perfumes, or deodorant.  Do not shave 48 hours prior to surgery.  Men may shave face and neck.  Do not bring valuables to the hospital.  [facility] is not responsible for any belongings or valuables.  Contacts, dentures or bridgework may not be worn into surgery.  Leave your suitcase in the car.  After surgery it may be brought to your room.  For patients admitted to the hospital, discharge time will be determined by your treatment team.  Patients discharged the day of surgery will not be allowed to drive home and must have someone with them for 24 hours.    Special instructions:   DO NOT smoke tobacco or vape for 24 hours before your procedure.   Please read over the following fact sheets that you were given. Anesthesia Post-op Instructions and Care and Recovery After Surgery       Upper Endoscopy, Adult, Care After After the procedure, it is common to have a sore throat. It is also common to have: Mild stomach pain or discomfort. Bloating. Nausea. Follow these instructions at home: The instructions below may help you  care for yourself at home. Your health care provider may give you more instructions. If you have questions, ask your health care provider. If you were given a sedative during the procedure, it can affect you for several hours. Do not drive or operate machinery until your health care provider says that it is safe. If you will be going home right after the procedure, plan to have a responsible adult: Take you home from the hospital or clinic. You will not be allowed to drive. Care for you for the time you are told. Follow instructions from your health care provider about what you may eat and drink. Return to your normal activities as told by your health care provider. Ask your health care provider what activities are safe for you. Take over-the-counter and prescription medicines only as told by your health care provider. Contact a health care provider if you: Have a sore throat that lasts longer than one day. Have trouble swallowing. Have a fever. Get help right away if you: Vomit blood or your vomit looks like coffee grounds. Have bloody, black, or tarry stools. Have a very bad sore throat or you cannot swallow. Have difficulty breathing or  very bad pain in your chest or abdomen. These symptoms may be an emergency. Get help right away. Call 911. Do not wait to see if the symptoms will go away. Do not drive yourself to the hospital. Summary After the procedure, it is common to have a sore throat, mild stomach discomfort, bloating, and nausea. If you were given a sedative during the procedure, it can affect you for several hours. Do not drive until your health care provider says that it is safe. Follow instructions from your health care provider about what you may eat and drink. Return to your normal activities as told by your health care provider. This information is not intended to replace advice given to you by your health care provider. Make sure you discuss any questions you have with your  health care provider. Document Revised: 02/15/2022 Document Reviewed: 02/15/2022 Elsevier Patient Education  2024 Else[employer]. Colonoscopy, Adult, Care After The following information offers guidance on how to care for yourself after your procedure. Your health care provider may also give you more specific instructions. If you have problems or questions, contact your health care provider. What can I expect after the procedure? After the procedure, it is common to have: A small amount of blood in your stool for 24 hours after the procedure. Some gas. Mild cramping or bloating of your abdomen. Follow these instructions at home: Eating and drinking  Drink enough fluid to keep your urine pale yellow. Follow instructions from your health care provider about eating or drinking restrictions. Resume your normal diet as told by your health care provider. Avoid heavy or fried foods that are hard to digest. Activity Rest as told by your health care provider. Avoid sitting for a long time without moving. Get up to take short walks every 1-2 hours. This is important to improve blood flow and breathing. Ask for help if you feel weak or unsteady. Return to your normal activities as told by your health care provider. Ask your health care provider what activities are safe for you. Managing cramping and bloating  Try walking around when you have cramps or feel bloated. If directed, apply heat to your abdomen as told by your health care provider. Use the heat source that your health care provider recommends, such as a moist heat pack or a heating pad. Place a towel between your skin and the heat source. Leave the heat on for 20-30 minutes. Remove the heat if your skin turns bright red. This is especially important if you are unable to feel pain, heat, or cold. You have a greater risk of getting burned. General instructions If you were given a sedative during the procedure, it can affect you for several  hours. Do not drive or operate machinery until your health care provider says that it is safe. For the first 24 hours after the procedure: Do not sign important documents. Do not drink alcohol. Do your regular daily activities at a slower pace than normal. Eat soft foods that are easy to digest. Take over-the-counter and prescription medicines only as told by your health care provider. Keep all follow-up visits. This is important. Contact a health care provider if: You have blood in your stool 2-3 days after the procedure. Get help right away if: You have more than a small spotting of blood in your stool. You have large blood clots in your stool. You have swelling of your abdomen. You have nausea or vomiting. You have a fever. You have increasing pain in  your abdomen that is not relieved with medicine. These symptoms may be an emergency. Get help right away. Call 911. Do not wait to see if the symptoms will go away. Do not drive yourself to the hospital. Summary After the procedure, it is common to have a small amount of blood in your stool. You may also have mild cramping and bloating of your abdomen. If you were given a sedative during the procedure, it can affect you for several hours. Do not drive or operate machinery until your health care provider says that it is safe. Get help right away if you have a lot of blood in your stool, nausea or vomiting, a fever, or increased pain in your abdomen. This information is not intended to replace advice given to you by your health care provider. Make sure you discuss any questions you have with your health care provider. Document Revised: 12/19/2022 Document Reviewed: 06/29/2021 Elsevier Patient Education  2024 Elsevier Inc. Monitored Anesthesia Care, Care After The following information offers guidance on how to care for yourself after your procedure. Your health care provider may also give you more specific instructions. If you have  problems or questions, contact your health care provider. What can I expect after the procedure? After the procedure, it is common to have: Tiredness. Little or no memory about what happened during or after the procedure. Impaired judgment when it comes to making decisions. Nausea or vomiting. Some trouble with balance. Follow these instructions at home: For the time period you were told by your health care provider:  Rest. Do not participate in activities where you could fall or become injured. Do not drive or use machinery. Do not drink alcohol. Do not take sleeping pills or medicines that cause drowsiness. Do not make important decisions or sign legal documents. Do not take care of children on your own. Medicines Take over-the-counter and prescription medicines only as told by your health care provider. If you were prescribed antibiotics, take them as told by your health care provider. Do not stop using the antibiotic even if you start to feel better. Eating and drinking Follow instructions from your health care provider about what you may eat and drink. Drink enough fluid to keep your urine pale yellow. If you vomit: Drink clear fluids slowly and in small amounts as you are able. Clear fluids include water, ice chips, low-calorie sports drinks, and fruit juice that has water added to it (diluted fruit juice). Eat light and bland foods in small amounts as you are able. These foods include bananas, applesauce, rice, lean meats, toast, and crackers. General instructions  Have a responsible adult stay with you for the time you are told. It is important to have someone help care for you until you are awake and alert. If you have sleep apnea, surgery and some medicines can increase your risk for breathing problems. Follow instructions from your health care provider about wearing your sleep device: When you are sleeping. This includes during daytime naps. While taking prescription pain  medicines, sleeping medicines, or medicines that make you drowsy. Do not use any products that contain nicotine or tobacco. These products include cigarettes, chewing tobacco, and vaping devices, such as e-cigarettes. If you need help quitting, ask your health care provider. Contact a health care provider if: You feel nauseous or vomit every time you eat or drink. You feel light-headed. You are still sleepy or having trouble with balance after 24 hours. You get a rash. You have a fever.  You have redness or swelling around the IV site. Get help right away if: You have trouble breathing. You have new confusion after you get home. These symptoms may be an emergency. Get help right away. Call 911. Do not wait to see if the symptoms will go away. Do not drive yourself to the hospital. This information is not intended to replace advice given to you by your health care provider. Make sure you discuss any questions you have with your health care provider. Document Revised: 04/03/2022 Document Reviewed: 04/03/2022 Elsevier Patient Education  2024 [employer].

## 2023-07-26 NOTE — Telephone Encounter (Signed)
Pt left message that she is unable to make it to her pre op on Monday. Returned call to patient and advised her to call the hospital. Gave number to pre op. Pt verbalized understanding.

## 2023-07-30 ENCOUNTER — Encounter (HOSPITAL_COMMUNITY)
Admission: RE | Admit: 2023-07-30 | Discharge: 2023-07-30 | Disposition: A | Discharge status: Home / Self Care | Payer: Medicare HMO | Source: Ambulatory Visit | Attending: Gastroenterology | Admitting: Gastroenterology

## 2023-07-30 DIAGNOSIS — Z720 Tobacco use: Secondary | ICD-10-CM

## 2023-07-30 DIAGNOSIS — Z6837 Body mass index (BMI) 37.0-37.9, adult: Secondary | ICD-10-CM

## 2023-07-30 DIAGNOSIS — N1832 Chronic kidney disease, stage 3b: Secondary | ICD-10-CM

## 2023-07-30 MED ORDER — VITAMIN D (ERGOCALCIFEROL) 1.25 MG (50000 UNIT) PO CAPS
50000.0000 [IU] | ORAL_CAPSULE | ORAL | 0 refills | Status: DC
Start: 2023-07-30 — End: 2024-01-18

## 2023-07-30 NOTE — Patient Instructions (Addendum)
Your procedure is scheduled on: 08/02/2023  Report to [facility] Main Entrance at   7:45  AM.  Call this number if you have problems the morning of surgery: [phone]   Remember:              Follow Directions on the letter you received from Your Physician's office regarding the Bowel Prep              No Smoking the day of Procedure :  Hold Farxiga for 3 days prior to procedure as instructed in letter   Take these medicines the morning of surgery with A SIP OF WATER: Gabapentin, Lamictal, and omeprazole   Do not wear jewelry, make-up or nail polish.    Do not bring valuables to the hospital.  Contacts, dentures or bridgework may not be worn into surgery.  .   Patients discharged the day of surgery will not be allowed to drive home.     Colonoscopy, Adult, Care After This sheet gives you information about how to care for yourself after your procedure. Your health care provider may also give you more specific instructions. If you have problems or questions, contact your health care provider. What can I expect after the procedure? After the procedure, it is common to have: A small amount of blood in your stool for 24 hours after the procedure. Some gas. Mild abdominal cramping or bloating.  Follow these instructions at home: General instructions  For the first 24 hours after the procedure: Do not drive or use machinery. Do not sign important documents. Do not drink alcohol. Do your regular daily activities at a slower pace than normal. Eat soft, easy-to-digest foods. Rest often. Take over-the-counter or prescription medicines only as told by your health care provider. It is up to you to get the results of your procedure. Ask your health care provider, or the department performing the procedure, when your results will be ready. Relieving cramping and bloating Try walking around when you have cramps or feel bloated. Apply heat to your abdomen as told by your health care  provider. Use a heat source that your health care provider recommends, such as a moist heat pack or a heating pad. Place a towel between your skin and the heat source. Leave the heat on for 20-30 minutes. Remove the heat if your skin turns bright red. This is especially important if you are unable to feel pain, heat, or cold. You may have a greater risk of getting burned. Eating and drinking Drink enough fluid to keep your urine clear or pale yellow. Resume your normal diet as instructed by your health care provider. Avoid heavy or fried foods that are hard to digest. Avoid drinking alcohol for as long as instructed by your health care provider. Contact a health care provider if: You have blood in your stool 2-3 days after the procedure. Get help right away if: You have more than a small spotting of blood in your stool. You pass large blood clots in your stool. Your abdomen is swollen. You have nausea or vomiting. You have a fever. You have increasing abdominal pain that is not relieved with medicine. This information is not intended to replace advice given to you by your health care provider. Make sure you discuss any questions you have with your health care provider. Document Released: 06/20/2004 Document Revised: 07/31/2016 Document Reviewed: 01/18/2016 Elsevier Interactive Patient Education  2018 [employer]  Upper Endoscopy, Adult, Care After After the procedure, it  is common to have a sore throat. It is also common to have: Mild stomach pain or discomfort. Bloating. Nausea. Follow these instructions at home: The instructions below may help you care for yourself at home. Your health care provider may give you more instructions. If you have questions, ask your health care provider. If you were given a sedative during the procedure, it can affect you for several hours. Do not drive or operate machinery until your health care provider says that it is safe. If you will be going home  right after the procedure, plan to have a responsible adult: Take you home from the hospital or clinic. You will not be allowed to drive. Care for you for the time you are told. Follow instructions from your health care provider about what you may eat and drink. Return to your normal activities as told by your health care provider. Ask your health care provider what activities are safe for you. Take over-the-counter and prescription medicines only as told by your health care provider. Contact a health care provider if you: Have a sore throat that lasts longer than one day. Have trouble swallowing. Have a fever. Get help right away if you: Vomit blood or your vomit looks like coffee grounds. Have bloody, black, or tarry stools. Have a very bad sore throat or you cannot swallow. Have difficulty breathing or very bad pain in your chest or abdomen. These symptoms may be an emergency. Get help right away. Call 911. Do not wait to see if the symptoms will go away. Do not drive yourself to the hospital. Summary After the procedure, it is common to have a sore throat, mild stomach discomfort, bloating, and nausea. If you were given a sedative during the procedure, it can affect you for several hours. Do not drive until your health care provider says that it is safe. Follow instructions from your health care provider about what you may eat and drink. Return to your normal activities as told by your health care provider. This information is not intended to replace advice given to you by your health care provider. Make sure you discuss any questions you have with your health care provider. Document Revised: 02/15/2022 Document Reviewed: 02/15/2022 Elsevier Patient Education  2024 [employer].

## 2023-07-30 NOTE — Addendum Note (Signed)
Addended by: Van Clines on: 07/30/2023 12:44 PM   Modules accepted: Orders

## 2023-07-31 ENCOUNTER — Encounter (HOSPITAL_COMMUNITY): Payer: Self-pay

## 2023-07-31 ENCOUNTER — Encounter (HOSPITAL_COMMUNITY)
Admission: RE | Admit: 2023-07-31 | Discharge: 2023-07-31 | Disposition: A | Discharge status: Home / Self Care | Payer: Medicare HMO | Source: Ambulatory Visit | Attending: Gastroenterology | Admitting: Gastroenterology

## 2023-07-31 DIAGNOSIS — F17290 Nicotine dependence, other tobacco product, uncomplicated: Secondary | ICD-10-CM | POA: Diagnosis not present

## 2023-07-31 DIAGNOSIS — Z6837 Body mass index (BMI) 37.0-37.9, adult: Secondary | ICD-10-CM | POA: Diagnosis not present

## 2023-07-31 DIAGNOSIS — Z720 Tobacco use: Secondary | ICD-10-CM

## 2023-07-31 DIAGNOSIS — N1832 Chronic kidney disease, stage 3b: Secondary | ICD-10-CM | POA: Diagnosis not present

## 2023-07-31 DIAGNOSIS — Z01818 Encounter for other preprocedural examination: Secondary | ICD-10-CM | POA: Insufficient documentation

## 2023-07-31 LAB — BASIC METABOLIC PANEL
Anion gap: 7 (ref 5–15)
BUN: 14 mg/dL (ref 6–20)
CO2: 24 mmol/L (ref 22–32)
Calcium: 8.6 mg/dL — ABNORMAL LOW (ref 8.9–10.3)
Chloride: 105 mmol/L (ref 98–111)
Creatinine, Ser: 1.17 mg/dL — ABNORMAL HIGH (ref 0.44–1.00)
GFR, Estimated: 54 mL/min — ABNORMAL LOW (ref >60)
Glucose, Bld: 107 mg/dL — ABNORMAL HIGH (ref 70–99)
Potassium: 3.3 mmol/L — ABNORMAL LOW (ref 3.5–5.1)
Sodium: 136 mmol/L (ref 135–145)

## 2023-08-01 ENCOUNTER — Telehealth: Payer: Self-pay

## 2023-08-01 NOTE — Telephone Encounter (Signed)
-----   Message from Van Clines sent at 07/30/2023 12:44 PM EDT ----- Pls let her know her vitamin B12 level is very low.Recommend B12 injections, patient will need 1000 mcg daily for 1 week, then weekly for 1 month, then monthly for a year. Would it be easier/closer for her to do this with her PCP or would she like [place] to do it? Other option is we teach someone in household to give injections and they do it for her.   Her vitamin D level is also low, I sent in another prescription for high dose vitamin D 50,000 units: take 1 capsule every 7 days for 8 weeks. Thanks

## 2023-08-01 NOTE — Telephone Encounter (Signed)
Pt called informed that vitamin B12 level is very low.Recommend B12 injections, patient will need 1000 mcg daily for 1 week, then weekly for 1 month, then monthly for a year. Would it be easier/closer for her to do this with her PCP or would she like [place] to do it? Other option is we teach someone in household to give injections and they do it for her. Pt would like to go to her PCP office to get her B12  Her vitamin D level is also low, I sent in another prescription for high dose vitamin D 50,000 units: take 1 capsule every 7 days for 8 weeks . Pt stated that the RX is $8.00 and she does not have the money but she will work on getting it,

## 2023-08-02 ENCOUNTER — Ambulatory Visit (HOSPITAL_COMMUNITY): Payer: Medicare HMO | Admitting: Anesthesiology

## 2023-08-02 ENCOUNTER — Telehealth (INDEPENDENT_AMBULATORY_CARE_PROVIDER_SITE_OTHER): Payer: Self-pay | Admitting: Gastroenterology

## 2023-08-02 ENCOUNTER — Encounter (INDEPENDENT_AMBULATORY_CARE_PROVIDER_SITE_OTHER): Payer: Self-pay | Admitting: *Deleted

## 2023-08-02 ENCOUNTER — Encounter (HOSPITAL_COMMUNITY): Payer: Self-pay

## 2023-08-02 ENCOUNTER — Encounter (HOSPITAL_COMMUNITY)
Admission: RE | Disposition: A | Discharge status: Home / Self Care | Payer: Self-pay | Source: Home / Self Care | Attending: Gastroenterology

## 2023-08-02 ENCOUNTER — Ambulatory Visit (HOSPITAL_COMMUNITY)
Admission: RE | Admit: 2023-08-02 | Discharge: 2023-08-02 | Disposition: A | Discharge status: Home / Self Care | Payer: Medicare HMO | Attending: Gastroenterology | Admitting: Gastroenterology

## 2023-08-02 DIAGNOSIS — K449 Diaphragmatic hernia without obstruction or gangrene: Secondary | ICD-10-CM

## 2023-08-02 DIAGNOSIS — K319 Disease of stomach and duodenum, unspecified: Secondary | ICD-10-CM

## 2023-08-02 DIAGNOSIS — K2970 Gastritis, unspecified, without bleeding: Secondary | ICD-10-CM

## 2023-08-02 DIAGNOSIS — G40909 Epilepsy, unspecified, not intractable, without status epilepticus: Secondary | ICD-10-CM | POA: Diagnosis not present

## 2023-08-02 DIAGNOSIS — D125 Benign neoplasm of sigmoid colon: Secondary | ICD-10-CM | POA: Diagnosis not present

## 2023-08-02 DIAGNOSIS — K2289 Other specified disease of esophagus: Secondary | ICD-10-CM | POA: Insufficient documentation

## 2023-08-02 DIAGNOSIS — K209 Esophagitis, unspecified without bleeding: Secondary | ICD-10-CM

## 2023-08-02 DIAGNOSIS — Z539 Procedure and treatment not carried out, unspecified reason: Secondary | ICD-10-CM | POA: Insufficient documentation

## 2023-08-02 DIAGNOSIS — Z8673 Personal history of transient ischemic attack (TIA), and cerebral infarction without residual deficits: Secondary | ICD-10-CM | POA: Insufficient documentation

## 2023-08-02 DIAGNOSIS — K3189 Other diseases of stomach and duodenum: Secondary | ICD-10-CM | POA: Diagnosis not present

## 2023-08-02 DIAGNOSIS — F419 Anxiety disorder, unspecified: Secondary | ICD-10-CM | POA: Diagnosis not present

## 2023-08-02 DIAGNOSIS — Z1211 Encounter for screening for malignant neoplasm of colon: Secondary | ICD-10-CM

## 2023-08-02 DIAGNOSIS — Z79899 Other long term (current) drug therapy: Secondary | ICD-10-CM | POA: Diagnosis not present

## 2023-08-02 DIAGNOSIS — K635 Polyp of colon: Secondary | ICD-10-CM | POA: Diagnosis not present

## 2023-08-02 DIAGNOSIS — E782 Mixed hyperlipidemia: Secondary | ICD-10-CM | POA: Diagnosis not present

## 2023-08-02 DIAGNOSIS — F17210 Nicotine dependence, cigarettes, uncomplicated: Secondary | ICD-10-CM | POA: Insufficient documentation

## 2023-08-02 DIAGNOSIS — N183 Chronic kidney disease, stage 3 unspecified: Secondary | ICD-10-CM | POA: Diagnosis not present

## 2023-08-02 DIAGNOSIS — R12 Heartburn: Secondary | ICD-10-CM | POA: Diagnosis not present

## 2023-08-02 DIAGNOSIS — K2100 Gastro-esophageal reflux disease with esophagitis, without bleeding: Secondary | ICD-10-CM | POA: Diagnosis not present

## 2023-08-02 DIAGNOSIS — R1310 Dysphagia, unspecified: Secondary | ICD-10-CM | POA: Insufficient documentation

## 2023-08-02 HISTORY — PX: ESOPHAGOGASTRODUODENOSCOPY (EGD) WITH PROPOFOL: SHX5813

## 2023-08-02 HISTORY — PX: FLEXIBLE SIGMOIDOSCOPY: SHX5431

## 2023-08-02 HISTORY — PX: BIOPSY: SHX5522

## 2023-08-02 LAB — HM COLONOSCOPY

## 2023-08-02 SURGERY — ESOPHAGOGASTRODUODENOSCOPY (EGD) WITH PROPOFOL
Anesthesia: General

## 2023-08-02 MED ORDER — PROPOFOL 10 MG/ML IV BOLUS
INTRAVENOUS | Status: DC | PRN
  Administered 2023-08-02 (×2): 50 mg via INTRAVENOUS
  Administered 2023-08-02: 100 mg via INTRAVENOUS

## 2023-08-02 MED ORDER — PROPOFOL 500 MG/50ML IV EMUL
INTRAVENOUS | Status: DC | PRN
  Administered 2023-08-02: 150 ug/kg/min via INTRAVENOUS

## 2023-08-02 MED ORDER — POLYETHYLENE GLYCOL 3350 17 GM/SCOOP PO POWD: 8.5000 g | Freq: Every day | ORAL | Status: AC

## 2023-08-02 MED ORDER — PANTOPRAZOLE SODIUM 40 MG PO TBEC: 40.0000 mg | DELAYED_RELEASE_TABLET | Freq: Every day | ORAL | 2 refills | Status: DC

## 2023-08-02 MED ORDER — KETAMINE HCL 10 MG/ML IJ SOLN: INTRAMUSCULAR | Status: DC | PRN

## 2023-08-02 MED ORDER — LIDOCAINE HCL 1 % IJ SOLN
INTRAMUSCULAR | Status: DC | PRN
  Administered 2023-08-02: 50 mg via INTRADERMAL

## 2023-08-02 MED ORDER — LACTATED RINGERS IV SOLN: INTRAVENOUS | Status: DC

## 2023-08-02 NOTE — Op Note (Signed)
[facility] Patient Name: Maria Campbell Procedure Date: 08/02/2023 9:21 AM MRN: [id number] Date of Birth: October 06, 1966 Attending MD: Sanjuan Dame , MD, [id number] CSN: [id number] Age: 57 Admit Type: Outpatient Procedure:                Colonoscopy Indications:              Screening for colorectal malignant neoplasm Providers:                Sanjuan Dame, MD, Crystal Page, Kristine L. Jessee Avers, Technician Referring MD:              Medicines:                Monitored Anesthesia Care Complications:            No immediate complications. Estimated Blood Loss:     Estimated blood loss: none. Procedure:                Pre-Anesthesia Assessment:                           - Prior to the procedure, a History and Physical                            was performed, and patient medications and                            allergies were reviewed. The patient's tolerance of                            previous anesthesia was also reviewed. The risks                            and benefits of the procedure and the sedation                            options and risks were discussed with the patient.                            All questions were answered, and informed consent                            was obtained. Prior Anticoagulants: The patient has                            taken no anticoagulant or antiplatelet agents. ASA                            Grade Assessment: III - A patient with severe                            systemic disease. After reviewing the risks and  benefits, the patient was deemed in satisfactory                            condition to undergo the procedure.                           After obtaining informed consent, the colonoscope                            was passed under direct vision. Throughout the                            procedure, the patient's blood pressure, pulse, and                             oxygen saturations were monitored continuously. The                            [id number]) scope was introduced through the                            anus with the intention of advancing to the hepatic                            flexure. The scope was advanced to the splenic                            flexure before the procedure was aborted.                            Medications were given. The colonoscopy was aborted                            due to poor bowel prep with stool present. Lavage                            did not allow for the successful completion of the                            procedure. Scope In: 9:24:11 AM Scope Out: 9:32:40 AM Total Procedure Duration: 0 hours 8 minutes 29 seconds  Findings:      The perianal and digital rectal examinations were normal.      A 20 mm polyp was found in the sigmoid colon. The polyp was       semi-pedunculated. Polyp resection was incomplete due to inadequate       bowel preparation and poor endoscopic visualization.      Stool was found in the rectum, in the sigmoid colon and in the ascending       colon, precluding visualization. Lavage of the area was performed using       a large amount of sterile water, resulting in incomplete clearance with       continued poor visualization. Impression:               - The procedure was  aborted due to poor bowel prep                            with stool present; scope inserted up until splenic                            flexure                           - One 20 mm polyp in the sigmoid colon; not removed                            due to poor prep                           - Stool in the rectum, in the sigmoid colon and in                            the ascending colon.                           - No specimens collected. Moderate Sedation:      Per Anesthesia Care Recommendation:           - Patient has a contact number available for                            emergencies. The  signs and symptoms of potential                            delayed complications were discussed with the                            patient. Return to normal activities tomorrow.                            Written discharge instructions were provided to the                            patient.                           - Resume previous diet.                           - Continue present medications.                           - Repeat colonoscopy at the next available                            appointment because the bowel preparation was poor                            and removal of polyps                           -  Return to GI office as previously scheduled. Procedure Code(s):        --- Professional ---                           [phone], 53, Colonoscopy, flexible; diagnostic,                            including collection of specimen(s) by brushing or                            washing, when performed (separate procedure) Diagnosis Code(s):        --- Professional ---                           Z12.11, Encounter for screening for malignant                            neoplasm of colon                           D12.5, Benign neoplasm of sigmoid colon CPT copyright 2022 American Medical Association. All rights reserved. The codes documented in this report are preliminary and upon coder review may  be revised to meet current compliance requirements. Sanjuan Dame, MD Sanjuan Dame, MD 08/02/2023 9:43:13 AM This report has been signed electronically. Number of Addenda: 0

## 2023-08-02 NOTE — Anesthesia Postprocedure Evaluation (Signed)
Anesthesia Post Note  Patient: Maria Campbell  Procedure(s) Performed: ESOPHAGOGASTRODUODENOSCOPY (EGD) WITH PROPOFOL FLEXIBLE SIGMOIDOSCOPY BIOPSY  Patient location during evaluation: Short Stay Anesthesia Type: General Level of consciousness: awake and alert Pain management: pain level controlled Vital Signs Assessment: post-procedure vital signs reviewed and stable Respiratory status: spontaneous breathing Cardiovascular status: blood pressure returned to baseline and stable Postop Assessment: no apparent nausea or vomiting Anesthetic complications: no   No notable events documented.   Last Vitals:  Vitals:   08/02/23 0806  BP: (!) 124/92  Pulse: 84  Temp: 36.5 C  SpO2: 98%    Last Pain:  Vitals:   08/02/23 0903  TempSrc:   PainSc: 0-No pain                 Makell Drohan

## 2023-08-02 NOTE — Anesthesia Preprocedure Evaluation (Signed)
Anesthesia Evaluation  Patient identified by MRN, date of birth, ID band Patient awake    Reviewed: Allergy & Precautions, H&P , NPO status , Patient's Chart, lab work & pertinent test results, reviewed documented beta blocker date and time   Airway Mallampati: II  TM Distance: >3 FB Neck ROM: full    Dental no notable dental hx.    Pulmonary neg pulmonary ROS, shortness of breath, Current Smoker and Patient abstained from smoking.   Pulmonary exam normal breath sounds clear to auscultation       Cardiovascular Exercise Tolerance: Good negative cardio ROS  Rhythm:regular Rate:Normal     Neuro/Psych  Headaches, Seizures -,  PSYCHIATRIC DISORDERS Anxiety Depression    CVA negative neurological ROS  negative psych ROS   GI/Hepatic negative GI ROS, Neg liver ROS,GERD  ,,  Endo/Other  negative endocrine ROS    Renal/GU CRFRenal diseasenegative Renal ROS  negative genitourinary   Musculoskeletal   Abdominal   Peds  Hematology negative hematology ROS (+)   Anesthesia Other Findings   Reproductive/Obstetrics negative OB ROS                             Anesthesia Physical Anesthesia Plan  ASA: 3  Anesthesia Plan: General   Post-op Pain Management:    Induction:   PONV Risk Score and Plan: Propofol infusion  Airway Management Planned:   Additional Equipment:   Intra-op Plan:   Post-operative Plan:   Informed Consent: I have reviewed the patients History and Physical, chart, labs and discussed the procedure including the risks, benefits and alternatives for the proposed anesthesia with the patient or authorized representative who has indicated his/her understanding and acceptance.     Dental Advisory Given  Plan Discussed with: CRNA  Anesthesia Plan Comments:        Anesthesia Quick Evaluation

## 2023-08-02 NOTE — Telephone Encounter (Signed)
Will call pt to reschedule once I have October schedule

## 2023-08-02 NOTE — Telephone Encounter (Signed)
[name], Maria Beets, MD  Marlowe Shores, LPN Hi Kenney Houseman  This patient had poor prep colonoscopy today  Please can you reschedule with EXTENDED BOWEL PREP ( 2 day prep - 2 days of 4 liters golytely each day ) also with miralax twice daily for 15 days  Room 3  She would need teaching  Thanks

## 2023-08-02 NOTE — Discharge Instructions (Signed)

## 2023-08-02 NOTE — Interval H&P Note (Signed)
History and Physical Interval Note:  08/02/2023 8:31 AM  Maria Campbell  has presented today for surgery, with the diagnosis of dysphagia, screening.  The various methods of treatment have been discussed with the patient and family. After consideration of risks, benefits and other options for treatment, the patient has consented to  Procedure(s) with comments: ESOPHAGOGASTRODUODENOSCOPY (EGD) WITH PROPOFOL (N/A) - 9:30am;asa 3 COLONOSCOPY WITH PROPOFOL (N/A) - 9:30am;asa 3 as a surgical intervention.  The patient's history has been reviewed, patient examined, no change in status, stable for surgery.  I have reviewed the patient's chart and labs.  Questions were answered to the patient's satisfaction.     [name] Dyshawn Cangelosi

## 2023-08-02 NOTE — Transfer of Care (Signed)
Immediate Anesthesia Transfer of Care Note  Patient: Maria Campbell  Procedure(s) Performed: ESOPHAGOGASTRODUODENOSCOPY (EGD) WITH PROPOFOL FLEXIBLE SIGMOIDOSCOPY BIOPSY  Patient Location: Short Stay  Anesthesia Type:General  Level of Consciousness: awake  Airway & Oxygen Therapy: Patient Spontanous Breathing  Post-op Assessment: Report given to RN  Post vital signs: Reviewed and stable  Last Vitals:  Vitals Value Taken Time  BP    Temp    Pulse 86 08/02/23 0940  Resp 18 08/02/23 0940  SpO2 100 % 08/02/23 0940  Vitals shown include unfiled device data.  Last Pain:  Vitals:   08/02/23 0903  TempSrc:   PainSc: 0-No pain         Complications: No notable events documented.

## 2023-08-02 NOTE — Op Note (Signed)
[facility] Patient Name: Maria Campbell Procedure Date: 08/02/2023 8:58 AM MRN: [id number] Date of Birth: 10-17-1966 Attending MD: Sanjuan Dame , MD, [id number] CSN: [id number] Age: 57 Admit Type: Outpatient Procedure:                Upper GI endoscopy Indications:              Dysphagia, Heartburn Providers:                Sanjuan Dame, MD, Crystal Page, Kristine L. Jessee Avers, Technician Referring MD:              Medicines:                Monitored Anesthesia Care Complications:            No immediate complications. Estimated Blood Loss:     Estimated blood loss was minimal. Procedure:                Pre-Anesthesia Assessment:                           - Prior to the procedure, a History and Physical                            was performed, and patient medications and                            allergies were reviewed. The patient's tolerance of                            previous anesthesia was also reviewed. The risks                            and benefits of the procedure and the sedation                            options and risks were discussed with the patient.                            All questions were answered, and informed consent                            was obtained. Prior Anticoagulants: The patient has                            taken no anticoagulant or antiplatelet agents. ASA                            Grade Assessment: III - A patient with severe                            systemic disease. After reviewing the risks and  benefits, the patient was deemed in satisfactory                            condition to undergo the procedure.                           After obtaining informed consent, the endoscope was                            passed under direct vision. Throughout the                            procedure, the patient's blood pressure, pulse, and                            oxygen  saturations were monitored continuously. The                            [id number]-[id number] ([id number]) scope was introduced through the                            mouth, and advanced to the second part of duodenum.                            The upper GI endoscopy was accomplished without                            difficulty. The patient tolerated the procedure                            well. Scope In: 9:10:08 AM Scope Out: 9:18:02 AM Total Procedure Duration: 0 hours 7 minutes 54 seconds  Findings:      LA Grade C (one or more mucosal breaks continuous between tops of 2 or       more mucosal folds, less than 75% circumference) esophagitis with no       bleeding was found in the lower third of the esophagus.      Salmon-colored mucosa was present. Biopsies were taken with a cold       forceps for histology.      Esophagogastric landmarks were identified: the gastroesophageal junction       was found at 38 cm from the incisors.      The Z-line was regular and was found 35 cm from the incisors.      A 3 cm hiatal hernia was present.      The gastroesophageal flap valve was visualized endoscopically and       classified as Hill Grade III (minimal fold, loose to endoscope, hiatal       hernia likely).      Mildly erythematous mucosa without bleeding was found in the entire       examined stomach. Biopsies were taken with a cold forceps for histology.      The duodenal bulb and second portion of the duodenum were normal. Impression:               - LA Grade C reflux esophagitis with no bleeding.                           -  Salmon-colored mucosa upper esophagus, likely                            inlet patch Biopsied.                           - Esophagogastric landmarks identified.                           - Z-line regular, 35 cm from the incisors.                           - 3 cm hiatal hernia.                           - Gastroesophageal flap valve classified as Hill                             Grade III (minimal fold, loose to endoscope, hiatal                            hernia likely).                           - Erythematous mucosa in the stomach. Biopsied.                           - Normal duodenal bulb and second portion of the                            duodenum. Moderate Sedation:      Per Anesthesia Care Recommendation:           - Patient has a contact number available for                            emergencies. The signs and symptoms of potential                            delayed complications were discussed with the                            patient. Return to normal activities tomorrow.                            Written discharge instructions were provided to the                            patient.                           - Resume previous diet.                           - Continue present medications.                           -  Await pathology results. Procedure Code(s):        --- Professional ---                           [phone], Esophagogastroduodenoscopy, flexible,                            transoral; with biopsy, single or multiple Diagnosis Code(s):        --- Professional ---                           K21.00, Gastro-esophageal reflux disease with                            esophagitis, without bleeding                           K22.89, Other specified disease of esophagus                           K44.9, Diaphragmatic hernia without obstruction or                            gangrene                           K31.89, Other diseases of stomach and duodenum                           R13.10, Dysphagia, unspecified                           R12, Heartburn CPT copyright 2022 American Medical Association. All rights reserved. The codes documented in this report are preliminary and upon coder review may  be revised to meet current compliance requirements. Sanjuan Dame, MD Sanjuan Dame, MD 08/02/2023 9:34:58 AM This report has been signed  electronically. Number of Addenda: 0

## 2023-08-06 LAB — SURGICAL PATHOLOGY

## 2023-08-08 NOTE — Telephone Encounter (Signed)
08/08/23  [name], [name], MD  Marlowe Shores, LPN Kathrene Sinopoli : Can this patient be scheduled for repeat colonoscopy in 4-6 weeks ( would need 14 days of Miralax BID and 2 day prep with Golytely 4 liters x2)  Diagnosis: colon polyps   Room: 3 (Around 09/12/23)

## 2023-08-08 NOTE — Progress Notes (Signed)
Maria Campbell : Can this patient be scheduled for repeat colonoscopy in 4-6 weeks ( would need 14 days of Miralax BID and 2 day prep with Golytely 4 liters x2)  Diagnosis: colon polyps   Room: 3  I reviewed the pathology results. Ann, can you send her a letter with the findings as described below please? Repeat colonoscopy in 4-6 weeks  Thanks,  Vista Lawman, MD Gastroenterology and Hepatology [facility]  ---------------------------------------------------------------------------------------------  [facility] [address], [place], [place] [address] Phone:  [phone]   08/08/23 [place], [place]   Dear Maria Campbell,  I am writing to inform you that the biopsies taken during your recent endoscopic examination showed:  No H. Pylori bacteria in stomach , or any early cancer changes to the stomach mucosa ( Intestinal metaplasia)  Normal biopsies of the food-pipe ( no eosinophilic esophagitis )   Your colonoscopy was poor prep and there were polyps which were not removed, I recommend to have repeat colonoscopy in 4 to 6 weeks with extended bowel prep-double bowel prep.  For now continue taking MiraLAX twice a day Also take Protonix once daily 30 minutes before breakfast  ---------------------------- Please call us at [phone] if you have persistent problems or have questions about your condition that have not been fully answered at this time.  Sincerely,  Vista Lawman, MD Gastroenterology and Hepatology

## 2023-08-09 ENCOUNTER — Encounter (INDEPENDENT_AMBULATORY_CARE_PROVIDER_SITE_OTHER): Payer: Self-pay | Admitting: *Deleted

## 2023-08-13 ENCOUNTER — Encounter (HOSPITAL_COMMUNITY): Payer: Self-pay | Admitting: Gastroenterology

## 2023-08-16 NOTE — Telephone Encounter (Signed)
Called pt to schedule. She reports she will call back at a later time to schedule

## 2023-08-30 ENCOUNTER — Other Ambulatory Visit: Payer: Self-pay | Admitting: Family Medicine

## 2023-08-30 DIAGNOSIS — Z87898 Personal history of other specified conditions: Secondary | ICD-10-CM

## 2023-08-30 DIAGNOSIS — F445 Conversion disorder with seizures or convulsions: Secondary | ICD-10-CM

## 2023-08-30 DIAGNOSIS — G2581 Restless legs syndrome: Secondary | ICD-10-CM

## 2023-10-15 ENCOUNTER — Other Ambulatory Visit (INDEPENDENT_AMBULATORY_CARE_PROVIDER_SITE_OTHER): Payer: Self-pay | Admitting: *Deleted

## 2023-10-15 ENCOUNTER — Telehealth (INDEPENDENT_AMBULATORY_CARE_PROVIDER_SITE_OTHER): Payer: Self-pay | Admitting: *Deleted

## 2023-10-16 MED ORDER — PANTOPRAZOLE SODIUM 40 MG PO TBEC: 40.0000 mg | DELAYED_RELEASE_TABLET | Freq: Every day | ORAL | 2 refills | Status: DC

## 2023-10-29 NOTE — Telephone Encounter (Signed)
error 

## 2023-11-06 ENCOUNTER — Other Ambulatory Visit: Payer: Self-pay | Admitting: Family Medicine

## 2023-11-06 DIAGNOSIS — E782 Mixed hyperlipidemia: Secondary | ICD-10-CM

## 2023-11-06 DIAGNOSIS — K219 Gastro-esophageal reflux disease without esophagitis: Secondary | ICD-10-CM

## 2023-11-15 ENCOUNTER — Telehealth (INDEPENDENT_AMBULATORY_CARE_PROVIDER_SITE_OTHER): Payer: Self-pay | Admitting: *Deleted

## 2023-11-15 NOTE — Telephone Encounter (Signed)
Received fax from [place] stating patient may be on duplicate therapies. She has pantoprazole on med list filled by Dr. Tasia Catchings and Monia Pouch has that she also taking omeprzole prescribed by Gilford Silvius. I called patient and she told me she is not taking omeprazole only pantoprazole.

## 2023-12-04 ENCOUNTER — Other Ambulatory Visit: Payer: Self-pay | Admitting: Family Medicine

## 2023-12-04 DIAGNOSIS — N1832 Chronic kidney disease, stage 3b: Secondary | ICD-10-CM

## 2023-12-31 ENCOUNTER — Other Ambulatory Visit: Payer: Self-pay | Admitting: Family Medicine

## 2023-12-31 DIAGNOSIS — N1832 Chronic kidney disease, stage 3b: Secondary | ICD-10-CM

## 2023-12-31 DIAGNOSIS — E782 Mixed hyperlipidemia: Secondary | ICD-10-CM

## 2023-12-31 NOTE — Telephone Encounter (Signed)
 NEEDS TO BE SEEN BEFORE NEXT REFILL

## 2023-12-31 NOTE — Telephone Encounter (Signed)
 Copied from CRM [id number]. Topic: Clinical - Medication Refill >> Dec 31, 2023  1:56 PM Carlatta H wrote: Most Recent Primary Care Visit:  Provider: Galvin Jules  Department[facility]  Visit Type: OFFICE VISIT  Date: 04/19/2023  Medication:  atorvastatin  (LIPITOR) 20 MG tablet [[id number]] dapagliflozin  propanediol (FARXIGA ) 10 MG TABS tablet [[id number]]   Has the patient contacted their pharmacy? Yes (Agent: If no, request that the patient contact the pharmacy for the refill. If patient does not wish to contact the pharmacy document the reason why and proceed with request.) (Agent: If yes, when and what did the pharmacy advise?) [employer] called to request prescription be called in for 90day supply because its hard for the patient to get to the pharmacy every 30days  Is this the correct pharmacy for this prescription? Yes If no, delete pharmacy and type the correct one.  This is the patient's preferred pharmacy:   [facility] - [place], [place] - [address] [place] [address] Phone: [phone] Fax: [phone]   Has the prescription been filled recently? No  Is the patient out of the medication? No  Has the patient been seen for an appointment in the last year OR does the patient have an upcoming appointment? Yes  Can we respond through MyChart? No  Agent: Please be advised that Rx refills may take up to 3 business days. We ask that you follow-up with your pharmacy.

## 2024-01-01 ENCOUNTER — Other Ambulatory Visit: Payer: Self-pay | Admitting: Family Medicine

## 2024-01-01 DIAGNOSIS — F445 Conversion disorder with seizures or convulsions: Secondary | ICD-10-CM

## 2024-01-01 DIAGNOSIS — Z87898 Personal history of other specified conditions: Secondary | ICD-10-CM

## 2024-01-01 DIAGNOSIS — G2581 Restless legs syndrome: Secondary | ICD-10-CM

## 2024-01-01 DIAGNOSIS — E782 Mixed hyperlipidemia: Secondary | ICD-10-CM

## 2024-01-01 DIAGNOSIS — N1832 Chronic kidney disease, stage 3b: Secondary | ICD-10-CM

## 2024-01-02 ENCOUNTER — Other Ambulatory Visit: Payer: Self-pay | Admitting: Neurology

## 2024-01-02 ENCOUNTER — Encounter: Payer: Self-pay | Admitting: Family Medicine

## 2024-01-02 DIAGNOSIS — G2581 Restless legs syndrome: Secondary | ICD-10-CM

## 2024-01-02 DIAGNOSIS — F445 Conversion disorder with seizures or convulsions: Secondary | ICD-10-CM

## 2024-01-02 DIAGNOSIS — N1832 Chronic kidney disease, stage 3b: Secondary | ICD-10-CM

## 2024-01-02 DIAGNOSIS — Z87898 Personal history of other specified conditions: Secondary | ICD-10-CM

## 2024-01-02 DIAGNOSIS — E782 Mixed hyperlipidemia: Secondary | ICD-10-CM

## 2024-01-02 MED ORDER — DAPAGLIFLOZIN PROPANEDIOL 10 MG PO TABS: 10.0000 mg | ORAL_TABLET | Freq: Every day | ORAL | 0 refills | Status: DC

## 2024-01-02 MED ORDER — GABAPENTIN 300 MG PO CAPS: 300.0000 mg | ORAL_CAPSULE | Freq: Every day | ORAL | 0 refills | Status: DC

## 2024-01-02 MED ORDER — ATORVASTATIN CALCIUM 20 MG PO TABS: 20.0000 mg | ORAL_TABLET | Freq: Every day | ORAL | 0 refills | Status: DC

## 2024-01-02 NOTE — Telephone Encounter (Signed)
Michelle pt NTBS 30-d given 12/04/23

## 2024-01-02 NOTE — Telephone Encounter (Signed)
LMTCB to schedule appt Letter mailed

## 2024-01-02 NOTE — Addendum Note (Signed)
Addended by: Julious Payer D on: 01/02/2024 10:55 AM   Modules accepted: Orders

## 2024-01-17 ENCOUNTER — Ambulatory Visit: Payer: Medicare HMO | Admitting: Family Medicine

## 2024-01-17 ENCOUNTER — Encounter: Payer: Self-pay | Admitting: Family Medicine

## 2024-01-17 VITALS — BP 104/71 | HR 86 | Temp 96.6°F | Ht 62.0 in | Wt 192.0 lb

## 2024-01-17 DIAGNOSIS — G2581 Restless legs syndrome: Secondary | ICD-10-CM | POA: Diagnosis not present

## 2024-01-17 DIAGNOSIS — F3341 Major depressive disorder, recurrent, in partial remission: Secondary | ICD-10-CM | POA: Diagnosis not present

## 2024-01-17 DIAGNOSIS — E782 Mixed hyperlipidemia: Secondary | ICD-10-CM | POA: Diagnosis not present

## 2024-01-17 DIAGNOSIS — R569 Unspecified convulsions: Secondary | ICD-10-CM

## 2024-01-17 DIAGNOSIS — F445 Conversion disorder with seizures or convulsions: Secondary | ICD-10-CM | POA: Diagnosis not present

## 2024-01-17 DIAGNOSIS — F17210 Nicotine dependence, cigarettes, uncomplicated: Secondary | ICD-10-CM

## 2024-01-17 DIAGNOSIS — Z87898 Personal history of other specified conditions: Secondary | ICD-10-CM

## 2024-01-17 DIAGNOSIS — Z6835 Body mass index (BMI) 35.0-35.9, adult: Secondary | ICD-10-CM | POA: Diagnosis not present

## 2024-01-17 DIAGNOSIS — K219 Gastro-esophageal reflux disease without esophagitis: Secondary | ICD-10-CM

## 2024-01-17 DIAGNOSIS — R7309 Other abnormal glucose: Secondary | ICD-10-CM

## 2024-01-17 DIAGNOSIS — J4489 Other specified chronic obstructive pulmonary disease: Secondary | ICD-10-CM | POA: Insufficient documentation

## 2024-01-17 DIAGNOSIS — Z122 Encounter for screening for malignant neoplasm of respiratory organs: Secondary | ICD-10-CM | POA: Diagnosis not present

## 2024-01-17 DIAGNOSIS — E559 Vitamin D deficiency, unspecified: Secondary | ICD-10-CM

## 2024-01-17 DIAGNOSIS — N1832 Chronic kidney disease, stage 3b: Secondary | ICD-10-CM | POA: Diagnosis not present

## 2024-01-17 LAB — BAYER DCA HB A1C WAIVED: HB A1C (BAYER DCA - WAIVED): 5.4 % (ref 4.8–5.6)

## 2024-01-17 MED ORDER — ATORVASTATIN CALCIUM 20 MG PO TABS: 20.0000 mg | ORAL_TABLET | Freq: Every day | ORAL | 1 refills | Status: DC

## 2024-01-17 MED ORDER — DAPAGLIFLOZIN PROPANEDIOL 10 MG PO TABS: 10.0000 mg | ORAL_TABLET | Freq: Every day | ORAL | 1 refills | Status: DC

## 2024-01-17 MED ORDER — OMEPRAZOLE 40 MG PO CPDR: 40.0000 mg | DELAYED_RELEASE_CAPSULE | Freq: Every day | ORAL | 1 refills | Status: DC

## 2024-01-17 MED ORDER — GABAPENTIN 300 MG PO CAPS: 300.0000 mg | ORAL_CAPSULE | Freq: Every day | ORAL | 1 refills | Status: DC

## 2024-01-17 NOTE — Progress Notes (Signed)
 Subjective:  Patient ID: Maria Campbell, female    DOB: 1966/05/02, 58 y.o.   MRN: [id number]  Patient Care Team: Sonny Masters, FNP as PCP - General (Family Medicine) Ronne Binning Mardene Celeste, MD as Consulting Physician (Urology) Michaelle Copas, MD as Referring Physician (Optometry) Karel Jarvis Lesle Chris, MD as Consulting Physician (Neurology)   Chief Complaint:  Medical Management of Chronic Issues, Heartburn, and Dizziness (On and off spells x 2 weeks )   HPI: Maria Campbell is a 58 y.o. female presenting on 01/17/2024 for Medical Management of Chronic Issues, Heartburn, and Dizziness (On and off spells x 2 weeks )   Discussed the use of AI scribe software for clinical note transcription with the patient, who gave verbal consent to proceed.  History of Present Illness   Maria Campbell is a 58 year old female with seizures and kidney disease who presents for a follow-up visit.  She has a history of seizures, which she believes occur at night as she lives alone and experiences severe headaches afterward. She has not had any headaches since December. She continues to take Lamictal and gabapentin for her seizures and restless legs, respectively, though she still experiences some symptoms of restless legs.  She continues to take [place] for her kidney disease and reports no changes in urine output or swelling, indicating stable kidney function.  Her reflux is not well-controlled with Protonix, and she previously had better results with omeprazole, which she was switched from. She experiences daily sickness and severe heartburn.  She is 30+ pack year smoker but denies any issues with her breathing. Previous PFTs by pulmonology did not indicate COPD. She attributes her improved breathing to weight loss, having lost 63 pounds from her previous weight of 255 pounds.  Regarding her cholesterol, she engages in regular walking and follows a diet that includes pre-made salads, vegetables, and  occasional protein like steak. She is currently taking atorvastatin for her cholesterol.  Her depression is stable, and she has not seen a psychiatrist recently.  She has not had a flu shot but has received the shingles vaccine due to her mother's experience with shingles.         01/17/2024    8:55 AM 04/19/2023   10:09 AM 03/28/2023    2:34 PM 12/21/2022   11:03 AM 10/31/2022   12:02 PM  Depression screen PHQ 2/9  Decreased Interest 2 0 1 2 1   Down, Depressed, Hopeless 0 0  1 2  PHQ - 2 Score 2 0 1 3 3   Altered sleeping 1 2  3 3   Tired, decreased energy 0 0  2 2  Change in appetite 1 2  2 2   Feeling bad or failure about yourself  0 0  0 0  Trouble concentrating 0 0  1 2  Moving slowly or fidgety/restless 0 0  0 0  Suicidal thoughts 0 0  0 0  PHQ-9 Score 4 4  11 12   Difficult doing work/chores Somewhat difficult Not difficult at all  Somewhat difficult Somewhat difficult      01/17/2024    8:55 AM 04/19/2023   10:09 AM 12/21/2022   11:04 AM 10/31/2022   12:03 PM  GAD 7 : Generalized Anxiety Score  Nervous, Anxious, on Edge 0 0 1 2  Control/stop worrying 1 2 1  0  Worry too much - different things 1 2 2  0  Trouble relaxing 1 1 2 3   Restless 0 0 2  0  Easily annoyed or irritable 0 0 2 0  Afraid - awful might happen 0 0 0 0  Total GAD 7 Score 3 5 10 5   Anxiety Difficulty Not difficult at all Not difficult at all Somewhat difficult Somewhat difficult      Relevant past medical, surgical, family, and social history reviewed and updated as indicated.  Allergies and medications reviewed and updated. Data reviewed: Chart in Epic.   Past Medical History:  Diagnosis Date   Arthritis    Barrett's esophagus    CKD (chronic kidney disease) stage 3, GFR 30-59 ml/min (HCC)    Dyspnea    GERD (gastroesophageal reflux disease)    History of kidney stones    Hyperlipidemia    Kidney stones    Lung mass    PET negative on 05/22/18   Seizures (HCC)    Stroke (HCC)    Vitamin D  deficiency     Past Surgical History:  Procedure Laterality Date   BIOPSY  08/02/2023   Procedure: BIOPSY;  Surgeon: Franky Macho, MD;  [facility];  Service: Endoscopy;;   BREAST BIOPSY WITH RADIO FREQUENCY LOCALIZER Left 06/28/2022   Procedure: BREAST BIOPSY WITH RADIO FREQUENCY LOCALIZER;  Surgeon: Lewie Chamber, DO;  Location: [facility];  Service: General;  Laterality: Left;   CYSTOSCOPY W/ URETERAL STENT PLACEMENT  05/05/2021   Procedure: CYSTOSCOPY WITH LEFT URETERAL STENT REPLACEMENT;  Surgeon: Malen Gauze, MD;  Location: [facility];  Service: Urology;;   CYSTOSCOPY WITH RETROGRADE PYELOGRAM, URETEROSCOPY AND STENT PLACEMENT Left 05/05/2021   Procedure: CYSTOSCOPY WITH BILATERAL RETROGRADE PYELOGRAM, BILATERAL URETEROSCOPY,  RIGHT URETERAL STENT PLACEMENT;  Surgeon: Malen Gauze, MD;  Location: [facility];  Service: Urology;  Laterality: Left;   ESOPHAGOGASTRODUODENOSCOPY (EGD) WITH PROPOFOL N/A 08/02/2023   Procedure: ESOPHAGOGASTRODUODENOSCOPY (EGD) WITH PROPOFOL;  Surgeon: Franky Macho, MD;  [facility]: [facility];  Service: Endoscopy;  Laterality: N/A;  9:30am;asa 3   FLEXIBLE SIGMOIDOSCOPY N/A 08/02/2023   Procedure: FLEXIBLE SIGMOIDOSCOPY;  Surgeon: Franky Macho, MD;  [facility];  Service: Endoscopy;  Laterality: N/A;  9:30am;asa 3   HIP SURGERY Left    HOLMIUM LASER APPLICATION Left 05/05/2021   Procedure: HOLMIUM LASER LITHOTRIPSY RIGHT URETERAL CALCULUS;  Surgeon: Malen Gauze, MD;  Location: [facility];  Service: Urology;  Laterality: Left;   KNEE SURGERY Left    STONE EXTRACTION WITH BASKET  05/05/2021   Procedure: LEFT URETERAL STONE EXTRACTION WITH BASKET;  Surgeon: Malen Gauze, MD;  Location: [facility];  Service: Urology;;    Social History   Socioeconomic History   Marital status: Single    Spouse name: Not on file   Number of children: 1   Years of education: Not on file   Highest education level: 8th grade   Occupational History   Occupation: disability due to seizures and strokes  Tobacco Use   Smoking status: Every Day    Current packs/day: 1.00    Average packs/day: 1 pack/day for 35.0 years (35.0 ttl pk-yrs)    Types: Cigarettes   Smokeless tobacco: Never  Vaping Use   Vaping status: Never Used  Substance and Sexual Activity   Alcohol use: Never   Drug use: Never   Sexual activity: Not Currently    Birth control/protection: None  Other Topics Concern   Not on file  Social History Narrative   Lives with a roommate - has one son - he lives hours away  Right handed    Social Drivers of Health   Financial Resource Strain: Low Risk  (03/28/2023)   Overall Financial Resource Strain (CARDIA)    Difficulty of Paying Living Expenses: Not hard at all  Food Insecurity: No Food Insecurity (03/28/2023)   Hunger Vital Sign    Worried About Running Out of Food in the Last Year: Never true    Ran Out of Food in the Last Year: Never true  Transportation Needs: No Transportation Needs (03/28/2023)   PRAPARE - Administrator, Civil Service (Medical): No    Lack of Transportation (Non-Medical): No  Physical Activity: Insufficiently Active (03/28/2023)   Exercise Vital Sign    Days of Exercise per Week: 3 days    Minutes of Exercise per Session: 30 min  Stress: No Stress Concern Present (03/28/2023)   [employer] of Occupational Health - Occupational Stress Questionnaire    Feeling of Stress : Not at all  Social Connections: Socially Isolated (03/28/2023)   Social Connection and Isolation Panel [NHANES]    Frequency of Communication with Friends and Family: More than three times a week    Frequency of Social Gatherings with Friends and Family: More than three times a week    Attends Religious Services: Never    Database administrator or Organizations: No    Attends Banker Meetings: Never    Marital Status: Never married  Intimate Partner Violence: Not At Risk  (03/28/2023)   Humiliation, Afraid, Rape, and Kick questionnaire    Fear of Current or Ex-Partner: No    Emotionally Abused: No    Physically Abused: No    Sexually Abused: No    Outpatient Encounter Medications as of 01/17/2024  Medication Sig   aspirin EC 81 MG tablet Take 1 tablet (81 mg total) by mouth at bedtime.   lamoTRIgine (LAMICTAL) 100 MG tablet Take 1 tablet every night   nortriptyline (PAMELOR) 50 MG capsule Take 1 capsule (50 mg total) by mouth at bedtime.   omeprazole (PRILOSEC) 40 MG capsule Take 1 capsule (40 mg total) by mouth daily.   polyethylene glycol powder (GLYCOLAX/MIRALAX) 17 GM/SCOOP powder Take 8.5 g by mouth daily.   Vitamin D, Ergocalciferol, (DRISDOL) 1.25 MG (50000 UNIT) CAPS capsule Take 1 capsule (50,000 Units total) by mouth every 7 (seven) days. For 8 weeks   [DISCONTINUED] atorvastatin (LIPITOR) 20 MG tablet Take 1 tablet (20 mg total) by mouth daily.   [DISCONTINUED] dapagliflozin propanediol (FARXIGA) 10 MG TABS tablet Take 1 tablet (10 mg total) by mouth daily before breakfast.   [DISCONTINUED] gabapentin (NEURONTIN) 300 MG capsule Take 1 capsule (300 mg total) by mouth daily.   atorvastatin (LIPITOR) 20 MG tablet Take 1 tablet (20 mg total) by mouth daily.   dapagliflozin propanediol (FARXIGA) 10 MG TABS tablet Take 1 tablet (10 mg total) by mouth daily before breakfast.   gabapentin (NEURONTIN) 300 MG capsule Take 1 capsule (300 mg total) by mouth daily.   [DISCONTINUED] pantoprazole (PROTONIX) 40 MG tablet Take 1 tablet (40 mg total) by mouth daily.   No facility-administered encounter medications on file as of 01/17/2024.    Allergies  Allergen Reactions   Meperidine Hives and Nausea And Vomiting    Fainting and n/v (Demerol)    Other     Bleach-chest tightness/nausea & vomiting/ felt as if I can't breathe   Bee Venom    Dakin's [Sodium Hypochlorite] Nausea And Vomiting    Acitve ingredient within bleach  Pertinent ROS per HPI,  otherwise unremarkable      Objective:  BP 104/71   Pulse 86   Temp (!) 96.6 F (35.9 C)   Ht 5\' 2"  (1.575 m)   Wt 192 lb (87.1 kg)   LMP  (LMP Unknown)   SpO2 98%   BMI 35.12 kg/m    Wt Readings from Last 3 Encounters:  01/17/24 192 lb (87.1 kg)  08/02/23 204 lb 9.4 oz (92.8 kg)  07/31/23 (P) 204 lb 9.6 oz (92.8 kg)    Physical Exam Vitals and nursing note reviewed.  Constitutional:      General: She is not in acute distress.    Appearance: Normal appearance. She is well-developed. She is morbidly obese. She is not ill-appearing, toxic-appearing or diaphoretic.  HENT:     Head: Normocephalic and atraumatic.     Jaw: There is normal jaw occlusion.     Right Ear: Hearing normal.     Left Ear: Hearing normal.     Nose: Nose normal.     Mouth/Throat:     Lips: Pink.     Mouth: Mucous membranes are moist.     Pharynx: Oropharynx is clear. Uvula midline.  Eyes:     General: Lids are normal.     Extraocular Movements: Extraocular movements intact.     Conjunctiva/sclera: Conjunctivae normal.     Pupils: Pupils are equal, round, and reactive to light.  Neck:     Thyroid: No thyroid mass, thyromegaly or thyroid tenderness.     Vascular: No carotid bruit or JVD.     Trachea: Trachea and phonation normal.  Cardiovascular:     Rate and Rhythm: Normal rate and regular rhythm.     Chest Wall: PMI is not displaced.     Pulses: Normal pulses.     Heart sounds: Normal heart sounds. No murmur heard.    No friction rub. No gallop.  Pulmonary:     Effort: Pulmonary effort is normal. No respiratory distress.     Breath sounds: Normal breath sounds. No wheezing.  Abdominal:     General: Bowel sounds are normal. There is no distension or abdominal bruit.     Palpations: Abdomen is soft. There is no hepatomegaly or splenomegaly.     Tenderness: There is no abdominal tenderness. There is no right CVA tenderness or left CVA tenderness.     Hernia: No hernia is present.   Musculoskeletal:        General: Normal range of motion.     Cervical back: Normal range of motion and neck supple.     Right lower leg: No edema.     Left lower leg: No edema.  Lymphadenopathy:     Cervical: No cervical adenopathy.  Skin:    General: Skin is warm and dry.     Capillary Refill: Capillary refill takes less than 2 seconds.     Coloration: Skin is not cyanotic, jaundiced or pale.     Findings: No rash.     Comments: Healing sores to bilateral arms  Neurological:     General: No focal deficit present.     Mental Status: She is alert and oriented to person, place, and time.     Sensory: Sensation is intact.     Motor: Motor function is intact.     Coordination: Coordination is intact.     Gait: Gait is intact.     Deep Tendon Reflexes: Reflexes are normal and symmetric.  Psychiatric:  Attention and Perception: Attention and perception normal.        Mood and Affect: Mood and affect normal.        Speech: Speech normal.        Behavior: Behavior normal. Behavior is cooperative.        Thought Content: Thought content normal.        Cognition and Memory: Cognition and memory normal.        Judgment: Judgment normal.     Results for orders placed or performed in visit on 08/02/23  [facility] COLONOSCOPY   Collection Time: 08/02/23 12:00 AM  Result Value Ref Range   HM Colonoscopy See Report (in chart) See Report (in chart), Patient Reported       Pertinent labs & imaging results that were available during my care of the patient were reviewed by me and considered in my medical decision making.  Assessment & Plan:  Maria Campbell was seen today for medical management of chronic issues, heartburn and dizziness.  Diagnoses and all orders for this visit:  Stage 3b chronic kidney disease (HCC) -     dapagliflozin propanediol (FARXIGA) 10 MG TABS tablet; Take 1 tablet (10 mg total) by mouth daily before breakfast. -     CMP14+EGFR -     CBC with Differential/Platelet -      VITAMIN D 25 Hydroxy (Vit-D Deficiency, Fractures)  Morbid obesity (HCC) -     Bayer DCA Hb A1c Waived -     CMP14+EGFR -     CBC with Differential/Platelet -     Lipid panel -     Thyroid Panel With TSH  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) -     Thyroid Panel With TSH  Mixed hyperlipidemia -     atorvastatin (LIPITOR) 20 MG tablet; Take 1 tablet (20 mg total) by mouth daily. -     CMP14+EGFR -     Lipid panel  Convulsions, unspecified convulsion type (HCC) -     Lamotrigine level  History of seizure -     gabapentin (NEURONTIN) 300 MG capsule; Take 1 capsule (300 mg total) by mouth daily. -     Lamotrigine level  Psychogenic nonepileptic seizure -     gabapentin (NEURONTIN) 300 MG capsule; Take 1 capsule (300 mg total) by mouth daily. -     Lamotrigine level  Restless legs -     gabapentin (NEURONTIN) 300 MG capsule; Take 1 capsule (300 mg total) by mouth daily.  Elevated glucose -     Bayer DCA Hb A1c Waived -     CMP14+EGFR  Vitamin D deficiency -     CMP14+EGFR -     VITAMIN D 25 Hydroxy (Vit-D Deficiency, Fractures)  Gastroesophageal reflux disease without esophagitis -     CBC with Differential/Platelet -     omeprazole (PRILOSEC) 40 MG capsule; Take 1 capsule (40 mg total) by mouth daily.  Heavy cigarette smoker (20-39 per day) -     Ambulatory Referral Lung Cancer Screening [facility]  Screening for lung cancer -     Ambulatory Referral Lung Cancer Screening [facility]     Assessment and Plan    Gastroesophageal Reflux Disease (GERD) Persistent heartburn and daily nausea despite pantoprazole. Better symptom control with omeprazole previously. - Discontinue pantoprazole - Prescribe omeprazole - Follow up with Dr. Tasia Catchings regarding medication change  Seizure Disorder Convulsive seizures primarily at night. On Lamictal and gabapentin. No recent seizures or headaches. Importance of  monitoring Lamictal levels  discussed. - Check Lamictal level - Follow up with neurologist Dr. Karel Jarvis  Restless Legs Syndrome Occasional symptoms managed with gabapentin. - Continue gabapentin  Chronic Kidney Disease On Farxiga. No changes in urine output or swelling. Symptoms controlled. - Continue Farxiga  Hyperlipidemia Elevated cholesterol. Diet rich in vegetables and regular exercise. Continue current medication and lifestyle changes. - Continue atorvastatin  Depression Well-controlled. No recent episodes. Does not see a psychiatrist regularly. - Continue current management  Tobacco Use Disorder Smoking one pack per day since age [age]. Discussed risks and importance of lung cancer screening. - Order lung cancer screening [facility] Maintenance No flu shot received, has had shingles vaccine. Not around many people, does not feel the need for a flu shot. Plan next visit for physical and health maintenance updates. - Plan next visit in 6-8 months for physical and health maintenance updates  Follow-up - Follow up with neurologist Dr. Karel Jarvis - Follow up with gastroenterologist Dr. Tasia Catchings - Schedule next visit in 6-8 months for physical and health maintenance updates.        Total time spent with patient 45 minutes.    Continue all other maintenance medications.  Follow up plan: Return in about 6 months (around 07/16/2024) for Annual Physical.   Continue healthy lifestyle choices, including diet (rich in fruits, vegetables, and lean proteins, and low in salt and simple carbohydrates) and exercise (at least 30 minutes of moderate physical activity daily).  Educational handout given for health maintenance   The above assessment and management plan was discussed with the patient. The patient verbalized understanding of and has agreed to the management plan. Patient is aware to call the clinic if they develop any new symptoms or if symptoms persist or worsen. Patient is  aware when to return to the clinic for a follow-up visit. Patient educated on when it is appropriate to go to the emergency department.   Kari Baars, FNP-C [facility] [phone]

## 2024-01-18 LAB — CMP14+EGFR
ALT: 7 [IU]/L (ref 0–32)
AST: 9 [IU]/L (ref 0–40)
Albumin: 4 g/dL (ref 3.8–4.9)
Alkaline Phosphatase: 124 [IU]/L — ABNORMAL HIGH (ref 44–121)
BUN/Creatinine Ratio: 7 — ABNORMAL LOW (ref 9–23)
BUN: 8 mg/dL (ref 6–24)
Bilirubin Total: <0.2 mg/dL (ref 0.0–1.2)
CO2: 24 mmol/L (ref 20–29)
Calcium: 9.8 mg/dL (ref 8.7–10.2)
Chloride: 102 mmol/L (ref 96–106)
Creatinine, Ser: 1.14 mg/dL — ABNORMAL HIGH (ref 0.57–1.00)
Globulin, Total: 2.7 g/dL (ref 1.5–4.5)
Glucose: 88 mg/dL (ref 70–99)
Potassium: 3.7 mmol/L (ref 3.5–5.2)
Sodium: 141 mmol/L (ref 134–144)
Total Protein: 6.7 g/dL (ref 6.0–8.5)
eGFR: 56 mL/min/{1.73_m2} — ABNORMAL LOW (ref >59)

## 2024-01-18 LAB — LIPID PANEL
Chol/HDL Ratio: 4 {ratio} (ref 0.0–4.4)
Cholesterol, Total: 165 mg/dL (ref 100–199)
HDL: 41 mg/dL (ref >39)
LDL Chol Calc (NIH): 95 mg/dL (ref 0–99)
Triglycerides: 169 mg/dL — ABNORMAL HIGH (ref 0–149)
VLDL Cholesterol Cal: 29 mg/dL (ref 5–40)

## 2024-01-18 LAB — CBC WITH DIFFERENTIAL/PLATELET
Basophils Absolute: 0 10*3/uL (ref 0.0–0.2)
Basos: 1 %
EOS (ABSOLUTE): 0.1 10*3/uL (ref 0.0–0.4)
Eos: 2 %
Hematocrit: 42.6 % (ref 34.0–46.6)
Hemoglobin: 12.6 g/dL (ref 11.1–15.9)
Immature Grans (Abs): 0 10*3/uL (ref 0.0–0.1)
Immature Granulocytes: 0 %
Lymphocytes Absolute: 1.3 10*3/uL (ref 0.7–3.1)
Lymphs: 20 %
MCH: 25.1 pg — ABNORMAL LOW (ref 26.6–33.0)
MCHC: 29.6 g/dL — ABNORMAL LOW (ref 31.5–35.7)
MCV: 85 fL (ref 79–97)
Monocytes Absolute: 0.4 10*3/uL (ref 0.1–0.9)
Monocytes: 6 %
Neutrophils Absolute: 4.8 10*3/uL (ref 1.4–7.0)
Neutrophils: 71 %
Platelets: 229 10*3/uL (ref 150–450)
RBC: 5.02 x10E6/uL (ref 3.77–5.28)
RDW: 18.5 % — ABNORMAL HIGH (ref 11.7–15.4)
WBC: 6.8 10*3/uL (ref 3.4–10.8)

## 2024-01-18 LAB — THYROID PANEL WITH TSH
Free Thyroxine Index: 1.8 (ref 1.2–4.9)
T3 Uptake Ratio: 22 % — ABNORMAL LOW (ref 24–39)
T4, Total: 8 ug/dL (ref 4.5–12.0)
TSH: 2.21 u[IU]/mL (ref 0.450–4.500)

## 2024-01-18 LAB — VITAMIN D 25 HYDROXY (VIT D DEFICIENCY, FRACTURES): Vit D, 25-Hydroxy: 7.4 ng/mL — ABNORMAL LOW (ref 30.0–100.0)

## 2024-01-18 LAB — LAMOTRIGINE LEVEL: Lamotrigine Lvl: 4 ug/mL (ref 2.0–20.0)

## 2024-01-18 MED ORDER — VITAMIN D (ERGOCALCIFEROL) 1.25 MG (50000 UNIT) PO CAPS: 50000.0000 [IU] | ORAL_CAPSULE | ORAL | 6 refills | Status: DC

## 2024-01-18 NOTE — Addendum Note (Signed)
 Addended by: Sonny Masters on: 01/18/2024 08:19 AM   Modules accepted: Orders

## 2024-01-30 ENCOUNTER — Other Ambulatory Visit: Payer: Self-pay | Admitting: Family Medicine

## 2024-01-30 DIAGNOSIS — K219 Gastro-esophageal reflux disease without esophagitis: Secondary | ICD-10-CM

## 2024-01-30 NOTE — Telephone Encounter (Signed)
 Copied from CRM [id number]. Topic: Clinical - Prescription Issue >> Jan 30, 2024 11:45 AM Maria Campbell wrote: Reason for CRM: The patient would like to be contacted by a member of clinical staff when possible to verify the dose and directions of their medication  The patient has received a 15 MG variance of their omeprazole (PRILOSEC) 40 MG capsule [[id number]] prescription from their pharmacy

## 2024-01-31 ENCOUNTER — Telehealth: Payer: Self-pay

## 2024-01-31 NOTE — Telephone Encounter (Signed)
 Copied from CRM [id number]. Topic: Clinical - Prescription Issue >> Jan 31, 2024  1:14 PM Victorino Dike T wrote: Reason for CRM: omeprazole (PRILOSEC) 40 MG capsule- received 15 pills instead of 90 and will be out after today, please [phone]

## 2024-01-31 NOTE — Telephone Encounter (Signed)
 Patient aware 90 days was sent in and she is going to call the pharmacy to see why 15 pills was only given to her.

## 2024-02-13 ENCOUNTER — Encounter: Payer: Self-pay | Admitting: Neurology

## 2024-02-13 ENCOUNTER — Ambulatory Visit (INDEPENDENT_AMBULATORY_CARE_PROVIDER_SITE_OTHER): Payer: Medicare HMO | Admitting: Neurology

## 2024-02-13 VITALS — BP 105/75 | HR 92 | Ht 62.0 in | Wt 194.6 lb

## 2024-02-13 DIAGNOSIS — R202 Paresthesia of skin: Secondary | ICD-10-CM

## 2024-02-13 DIAGNOSIS — G43709 Chronic migraine without aura, not intractable, without status migrainosus: Secondary | ICD-10-CM | POA: Diagnosis not present

## 2024-02-13 DIAGNOSIS — E559 Vitamin D deficiency, unspecified: Secondary | ICD-10-CM

## 2024-02-13 DIAGNOSIS — Z87898 Personal history of other specified conditions: Secondary | ICD-10-CM

## 2024-02-13 MED ORDER — LAMOTRIGINE 100 MG PO TABS: ORAL_TABLET | ORAL | 3 refills | Status: DC

## 2024-02-13 MED ORDER — NORTRIPTYLINE HCL 50 MG PO CAPS: 50.0000 mg | ORAL_CAPSULE | Freq: Every day | ORAL | 3 refills | Status: DC

## 2024-02-13 NOTE — Patient Instructions (Signed)
 Good to see you.  Your vitamin B12 level is very low, it is important to start B12 replacement with your PCP  2. Continue vitamin D replacement  3. Continue Nortriptyline 50mg  every night and Lamotrigine 100mg  daily  4. Follow-up in 6 months, call for any changes
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
## 2024-02-14 ENCOUNTER — Telehealth: Payer: Self-pay

## 2024-02-14 ENCOUNTER — Other Ambulatory Visit: Payer: Self-pay | Admitting: *Deleted

## 2024-02-14 ENCOUNTER — Telehealth: Payer: Self-pay | Admitting: *Deleted

## 2024-02-14 DIAGNOSIS — Z87891 Personal history of nicotine dependence: Secondary | ICD-10-CM

## 2024-02-14 DIAGNOSIS — F17210 Nicotine dependence, cigarettes, uncomplicated: Secondary | ICD-10-CM

## 2024-02-14 DIAGNOSIS — Z122 Encounter for screening for malignant neoplasm of respiratory organs: Secondary | ICD-10-CM

## 2024-02-14 NOTE — Telephone Encounter (Signed)
 Lung Cancer Screening Narrative/Criteria Questionnaire (Cigarette Smokers Only- No Cigars/Pipes/vapes)   Maria Campbell   SDMV:02/27/24 8:30- [name]                                           Mar 09, 1966              LDCT: 03/04/24 10:30- AP    57 y.o.   Phone: [phone]  Lung Screening Narrative (confirm age 29-77 yrs Medicare / 50-80 yrs Private pay insurance)  Insurance information:[employer] MCR   Referring Provider:Rakes   This screening involves an initial phone call with a team member from our program. It is called a shared decision making visit. The initial meeting is required by insurance and Medicare to make sure you understand the program. This appointment takes about 15-20 minutes to complete. The CT scan will completed at a separate date/time. This scan takes about 5-10 minutes to complete and you may eat and drink before and after the scan.  Criteria questions for Lung Cancer Screening:   Are you a current or former smoker? Current Age began smoking: 8   If you are a former smoker, what year did you quit smoking? (within 15 yrs)   To calculate your smoking history, I need an accurate estimate of how many packs of cigarettes you smoked per day and for how many years. (Not just the number of PPD you are now smoking)   Years smoking 49 x Packs per day 1-3 = Pack years 100   (at least 20 pack yrs)   (Make sure they understand that we need to know how much they have smoked in the past, not just the number of PPD they are smoking now)  Do you have a personal history of cancer?  No    Do you have a family history of cancer? Yes  (cancer type and and relative) uncle (prostate)  Are you coughing up blood?  No  Have you had unexplained weight loss of 15 lbs or more in the last 6 months? No  It looks like you meet all criteria.     Additional information: N/A

## 2024-02-14 NOTE — Telephone Encounter (Signed)
 Spoke with patient Scheduled SDMV 02/27/24 CT scheduled 03/04/24 Pt voiced understanding and had no further questions.

## 2024-02-14 NOTE — Telephone Encounter (Signed)
 Copied from CRM [id number]. Topic: Appointments - Scheduling Inquiry for Clinic >> Feb 13, 2024  3:24 PM Isabell A wrote: Reason for CRM: Patient calling to schedule lung cancer screening.   Callback number: [phone]

## 2024-02-27 ENCOUNTER — Encounter: Payer: Self-pay | Admitting: Acute Care

## 2024-02-27 ENCOUNTER — Ambulatory Visit: Admitting: Acute Care

## 2024-02-27 DIAGNOSIS — F17210 Nicotine dependence, cigarettes, uncomplicated: Secondary | ICD-10-CM | POA: Diagnosis not present

## 2024-02-27 NOTE — Patient Instructions (Signed)

## 2024-02-27 NOTE — Progress Notes (Signed)
  Virtual Visit via Telephone Note  I connected with Maria Campbell on 02/27/24 at  8:30 AM EDT by telephone and verified that I am speaking with the correct person using two identifiers.  Location: Patient:  At home Provider: [address], [place], [place], [address]    I discussed the limitations, risks, security and privacy concerns of performing an evaluation and management service by telephone and the availability of in person appointments. I also discussed with the patient that there may be a patient responsible charge related to this service. The patient expressed understanding and agreed to proceed.   Shared Decision Making Visit Lung Cancer Screening Program [phone])   Eligibility: Age 58 y.o. Pack Years Smoking History Calculation 100 pack year smoking history (# packs/per year x # years smoked) Recent History of coughing up blood  no Unexplained weight loss? no ( >Than 15 pounds within the last 6 months ) Prior History Lung / other cancer no (Diagnosis within the last 5 years already requiring surveillance chest CT Scans). Smoking Status Current Smoker Former Smokers: Years since quit:  NA  Quit Date:  NA  Visit Components: Discussion included one or more decision making aids. yes Discussion included risk/benefits of screening. yes Discussion included potential follow up diagnostic testing for abnormal scans. yes Discussion included meaning and risk of over diagnosis. yes Discussion included meaning and risk of False Positives. yes Discussion included meaning of total radiation exposure. yes  Counseling Included: Importance of adherence to annual lung cancer LDCT screening. yes Impact of comorbidities on ability to participate in the program. yes Ability and willingness to under diagnostic treatment. yes  Smoking Cessation Counseling: Current Smokers:  Discussed importance of smoking cessation. yes Information about tobacco cessation classes and  interventions provided to patient. yes Patient provided with "ticket" for LDCT Scan. yes Symptomatic Patient. no  Counseling NA Diagnosis Code: Tobacco Use Z72.0 Asymptomatic Patient yes  Counseling (Intermediate counseling: > three minutes counseling) U0454 Former Smokers:  Discussed the importance of maintaining cigarette abstinence. yes Diagnosis Code: Personal History of Nicotine Dependence. U98.119 Information about tobacco cessation classes and interventions provided to patient. Yes Patient provided with "ticket" for LDCT Scan. yes Written Order for Lung Cancer Screening with LDCT placed in Epic. Yes (CT Chest Lung Cancer Screening Low Dose W/O CM) [id number] Z12.2-Screening of respiratory organs Z87.891-Personal history of nicotine dependence   Bevelyn Ngo, NP

## 2024-03-04 ENCOUNTER — Ambulatory Visit (HOSPITAL_COMMUNITY): Admission: RE | Admit: 2024-03-04 | Source: Ambulatory Visit

## 2024-03-06 ENCOUNTER — Other Ambulatory Visit: Payer: Self-pay | Admitting: Family Medicine

## 2024-03-06 DIAGNOSIS — E782 Mixed hyperlipidemia: Secondary | ICD-10-CM

## 2024-03-06 NOTE — Telephone Encounter (Signed)
 Copied from CRM [id number]. Topic: Clinical - Medication Refill >> Mar 06, 2024 10:51 AM Tiffany S wrote: Most Recent Primary Care Visit:   Medication: atorvastatin (LIPITOR) 20 MG tablet 90 day supply   Has the patient contacted their pharmacy? Yes (Agent: If no, request that the patient contact the pharmacy for the refill. If patient does not wish to contact the pharmacy document the reason why and proceed with request.) (Agent: If yes, when and what did the pharmacy advise?)  Is this the correct pharmacy for this prescription? Yes If no, delete pharmacy and type the correct one.  This is the patient's preferred pharmacy:  [facility] - [place], [place] - [address] [place] [address] Phone: [phone] Fax: [phone]   Has the prescription been filled recently? Yes  Is the patient out of the medication? Yes  Has the patient been seen for an appointment in the last year OR does the patient have an upcoming appointment? Yes  Can we respond through MyChart? Yes  Agent: Please be advised that Rx refills may take up to 3 business days. We ask that you follow-up with your pharmacy.

## 2024-03-14 ENCOUNTER — Telehealth: Payer: Self-pay

## 2024-03-14 NOTE — Telephone Encounter (Signed)
 Called to reschedule cancelled CT from 4/15, pt advises she still has a cough. Advised I will call her back in 2 weeks to see how she is and if she saw her PCP for meds.

## 2024-03-25 IMAGING — DX DG LUMBAR SPINE 2-3V
2 series · 2 of 2 positions shown · non-contrast
Comparison: None available

CLINICAL DATA: Low back pain

EXAM:
LUMBAR SPINE - 2-3 VIEW

[l-spine ap]
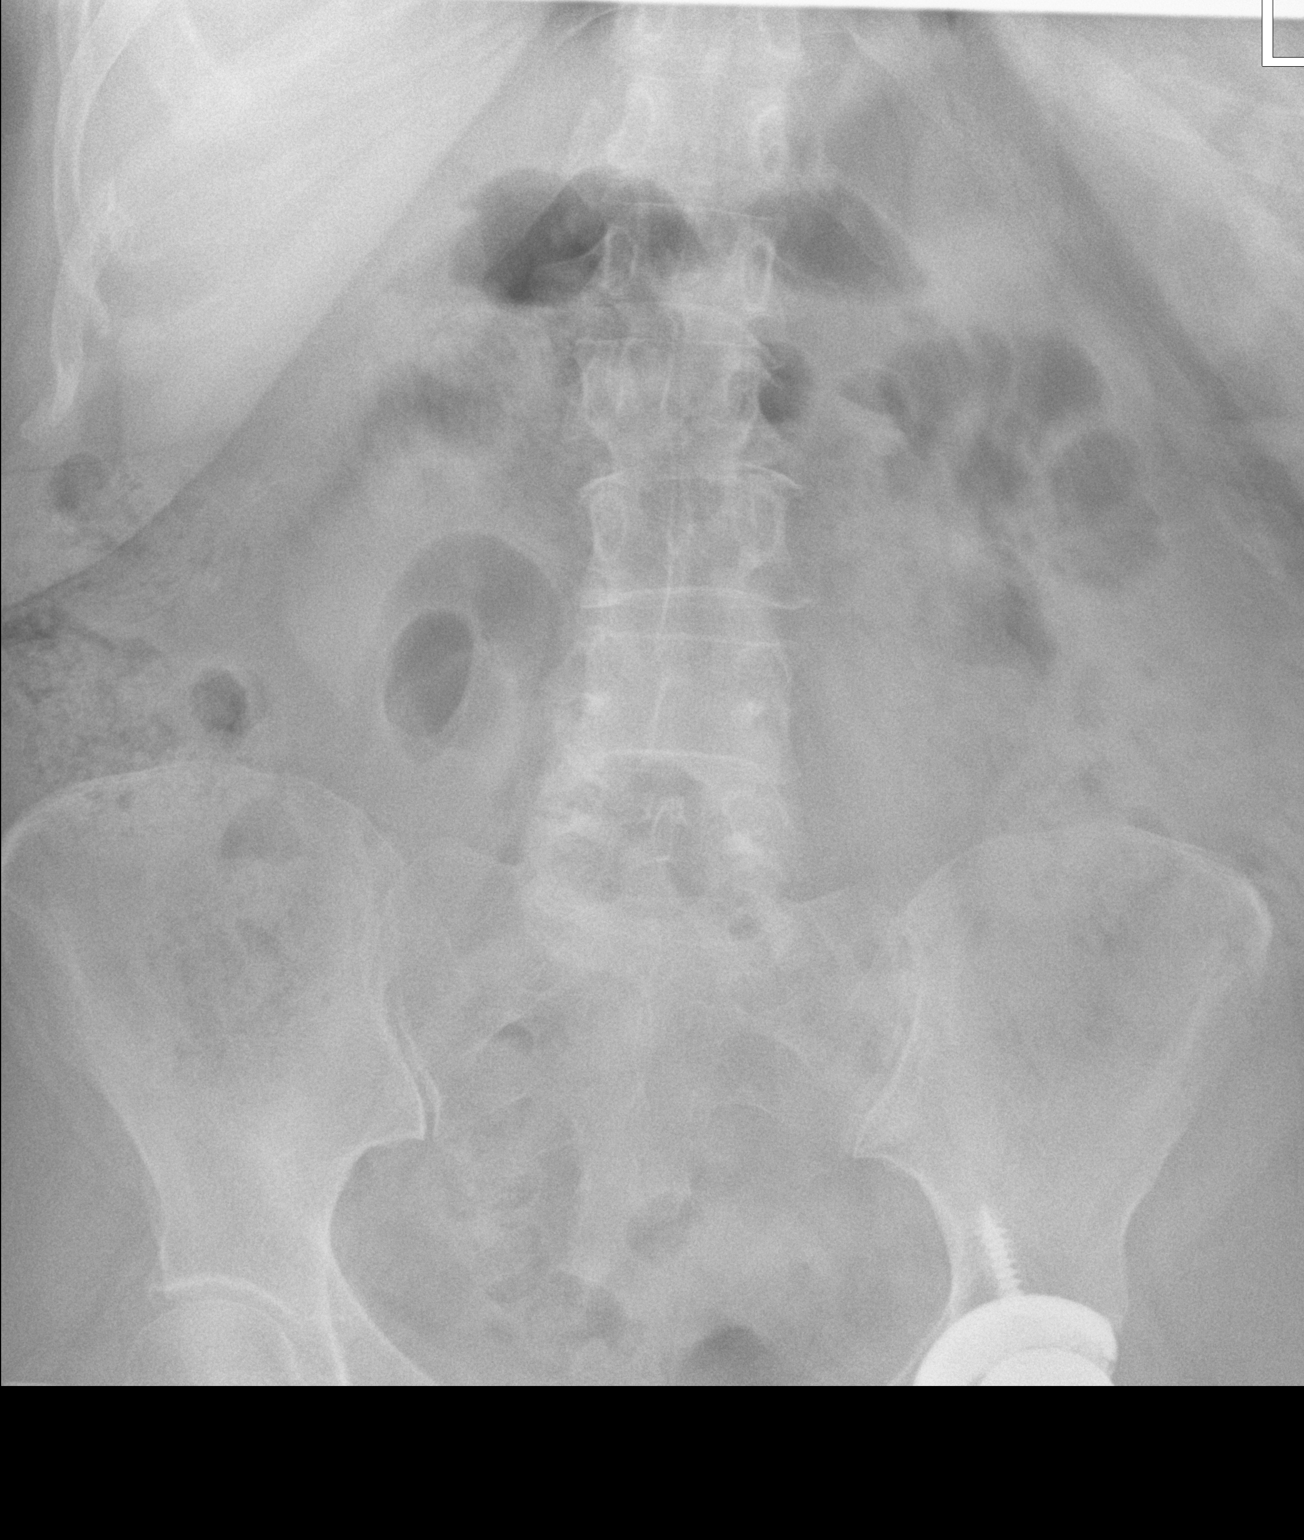

[l-spine lat]
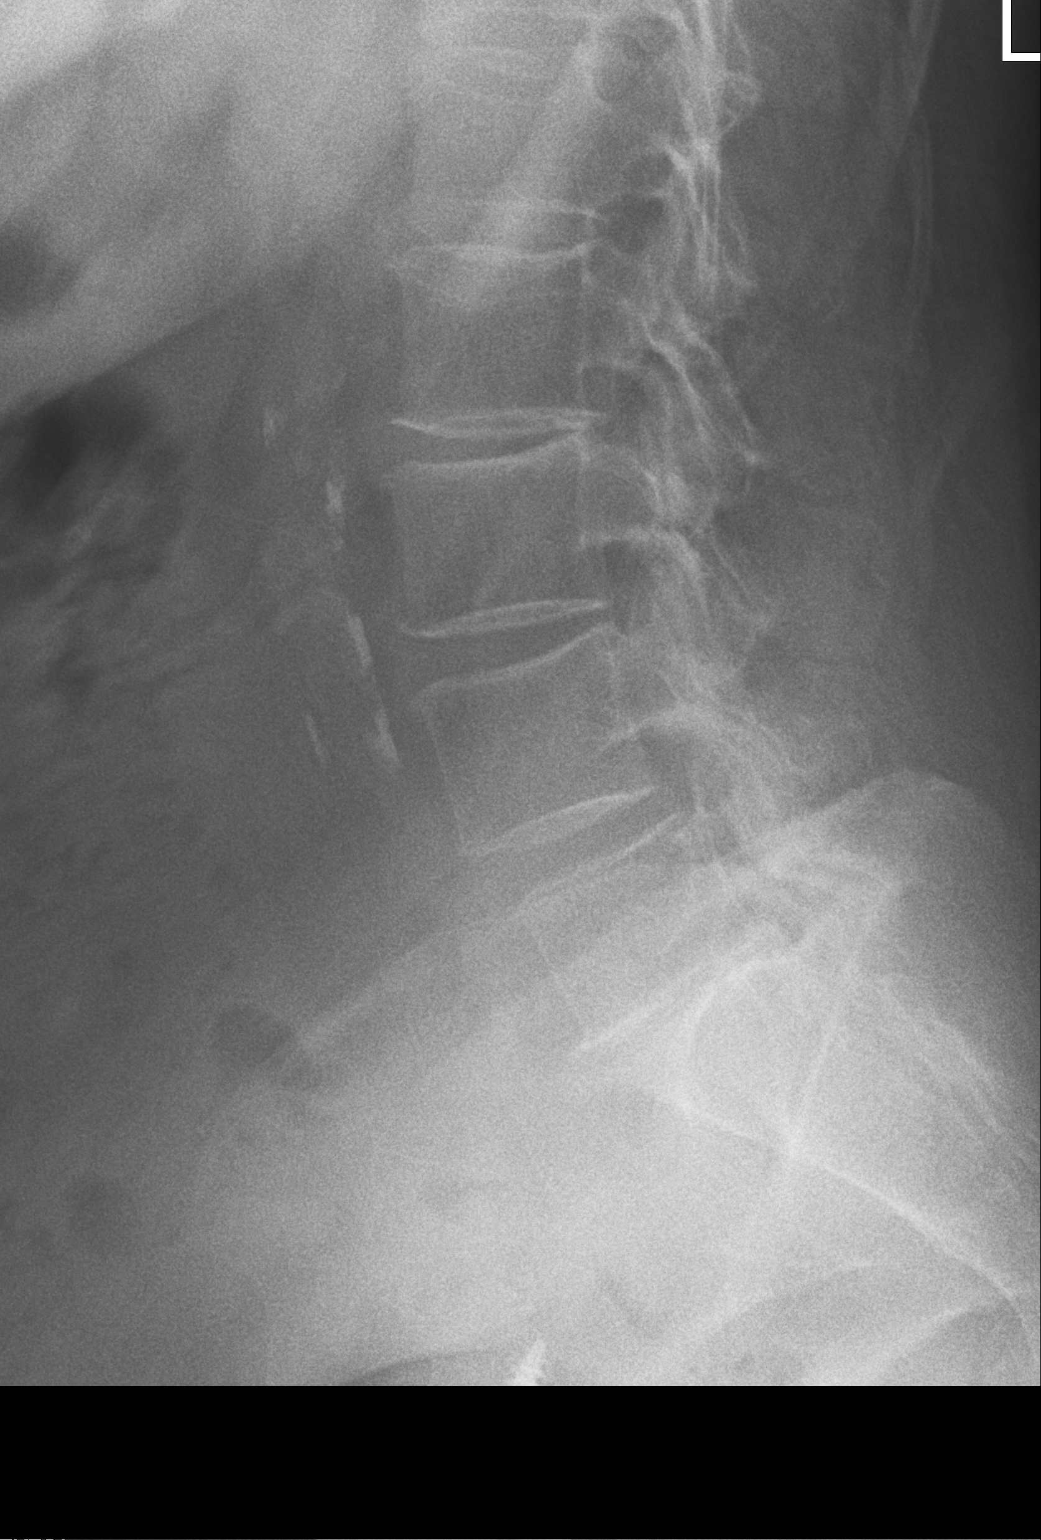

[2 of 2 positions shown; findings below may reference images not displayed]

FINDINGS: Left hip prosthesis partially visualized. Atherosclerotic changes
seen throughout visualized arterial segments.

Minimal anterolisthesis of L4 on L5. Mild dextroconvex curvature of
the thoracolumbar spine. Vertebral body heights are maintained. Mild
disc height loss at L1-L2, L2-L3, L5-S1. Mild facet degenerative
changes seen throughout the lower lumbar spine.
IMPRESSION: Mild multilevel degenerative changes of the lumbar spine.

## 2024-03-25 IMAGING — MG MM DIGITAL SCREENING BILAT W/ TOMO AND CAD
6 of 12 series · 6 of 36 positions shown · non-contrast
Comparison: None.

CLINICAL DATA: Screening.

EXAM:
DIGITAL SCREENING BILATERAL MAMMOGRAM WITH TOMOSYNTHESIS AND CAD
TECHNIQUE: Bilateral screening digital craniocaudal and mediolateral oblique
mammograms were obtained. Bilateral screening digital breast
tomosynthesis was performed. The images were evaluated with
computer-aided detection.

[L CV synth-2D]
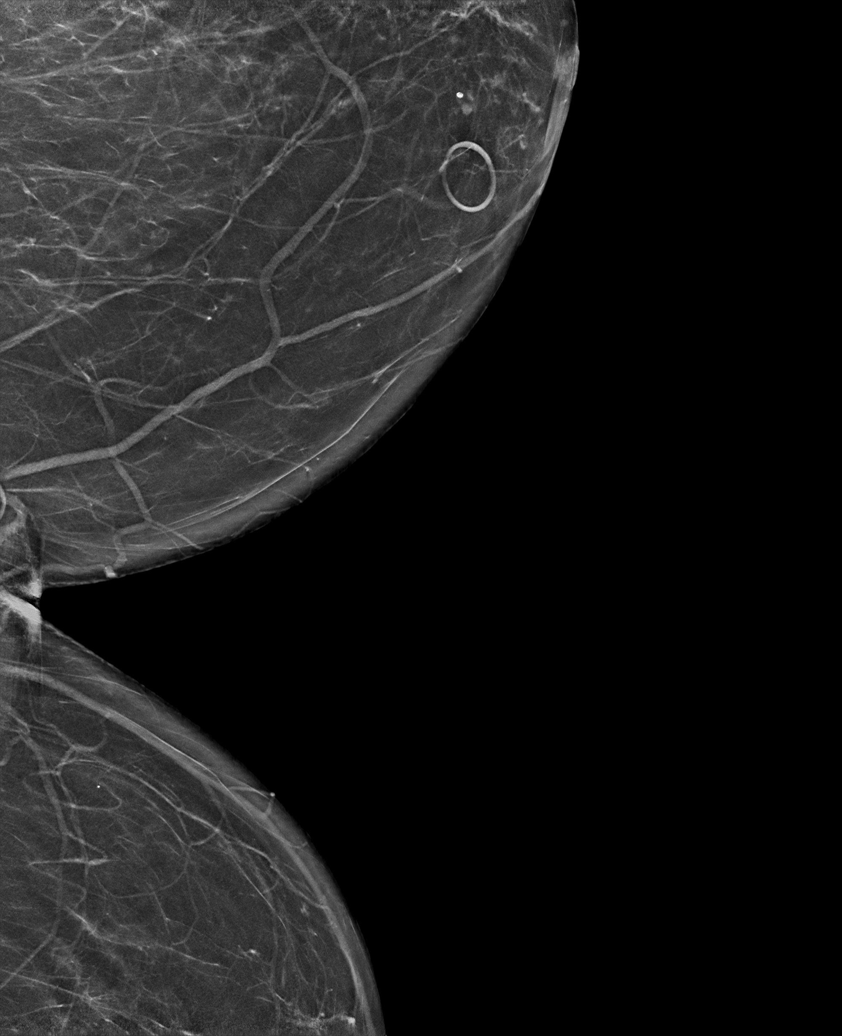

[L CC synth-2D]
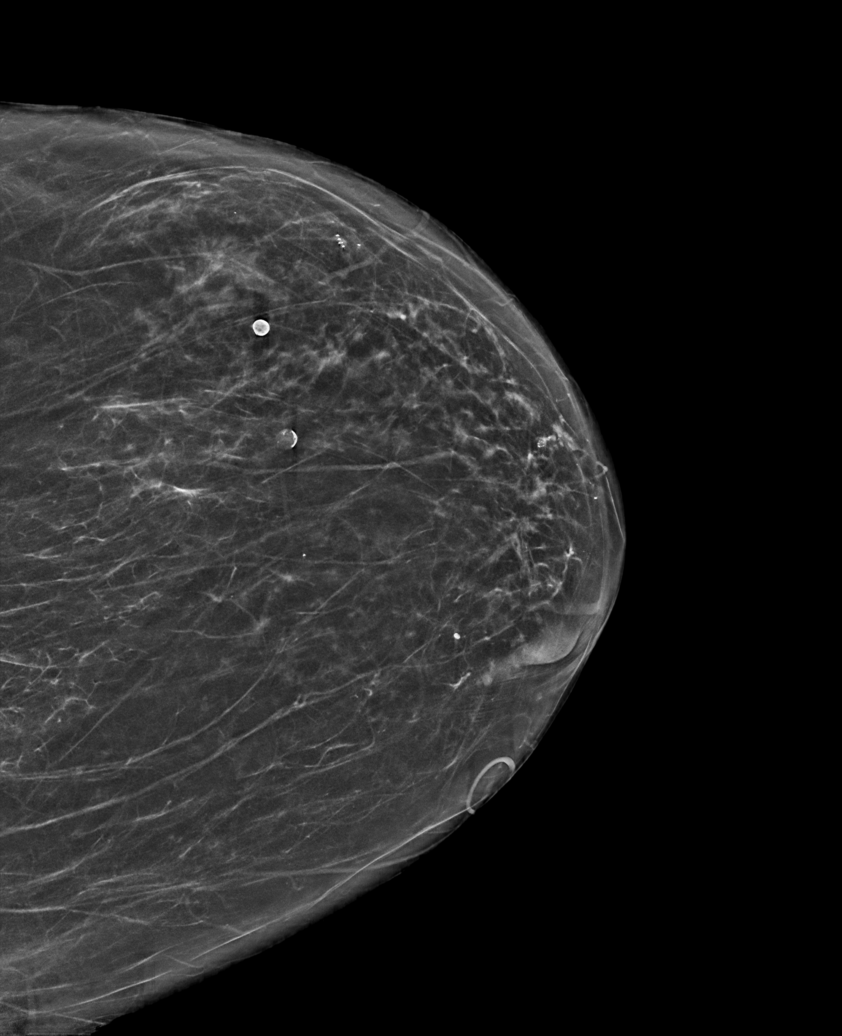

[R CC synth-2D]
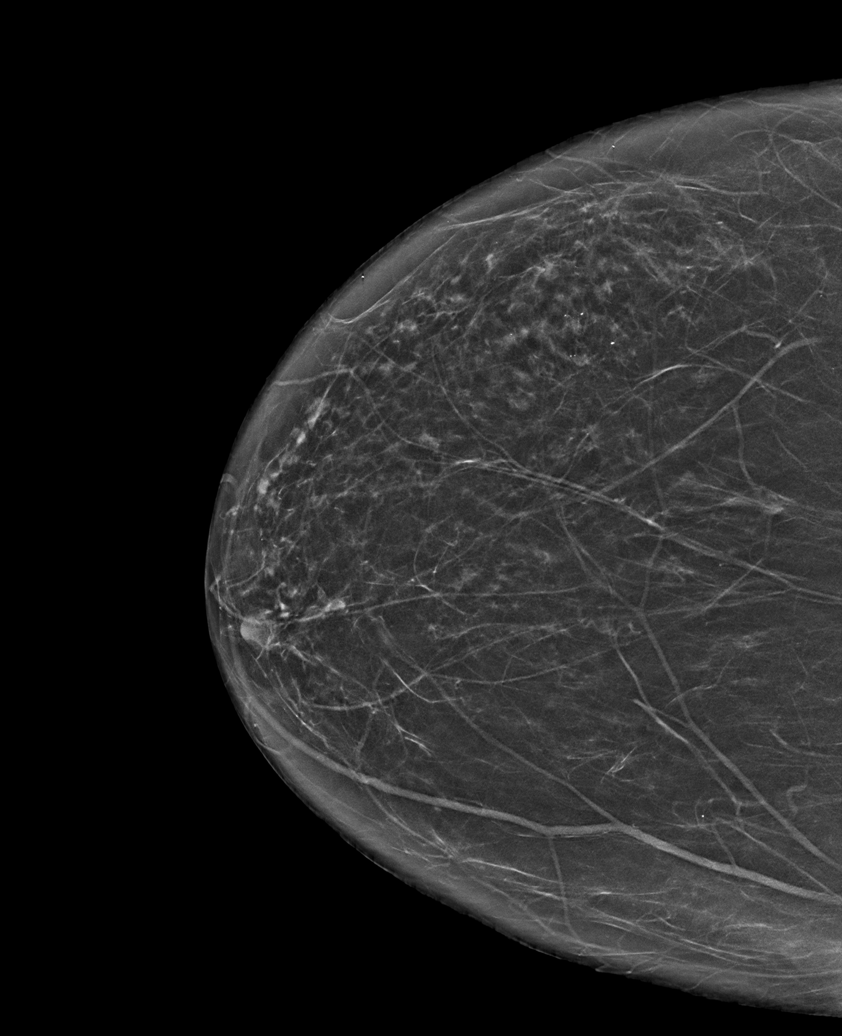

[L MLO synth-2D (1 of 2)]
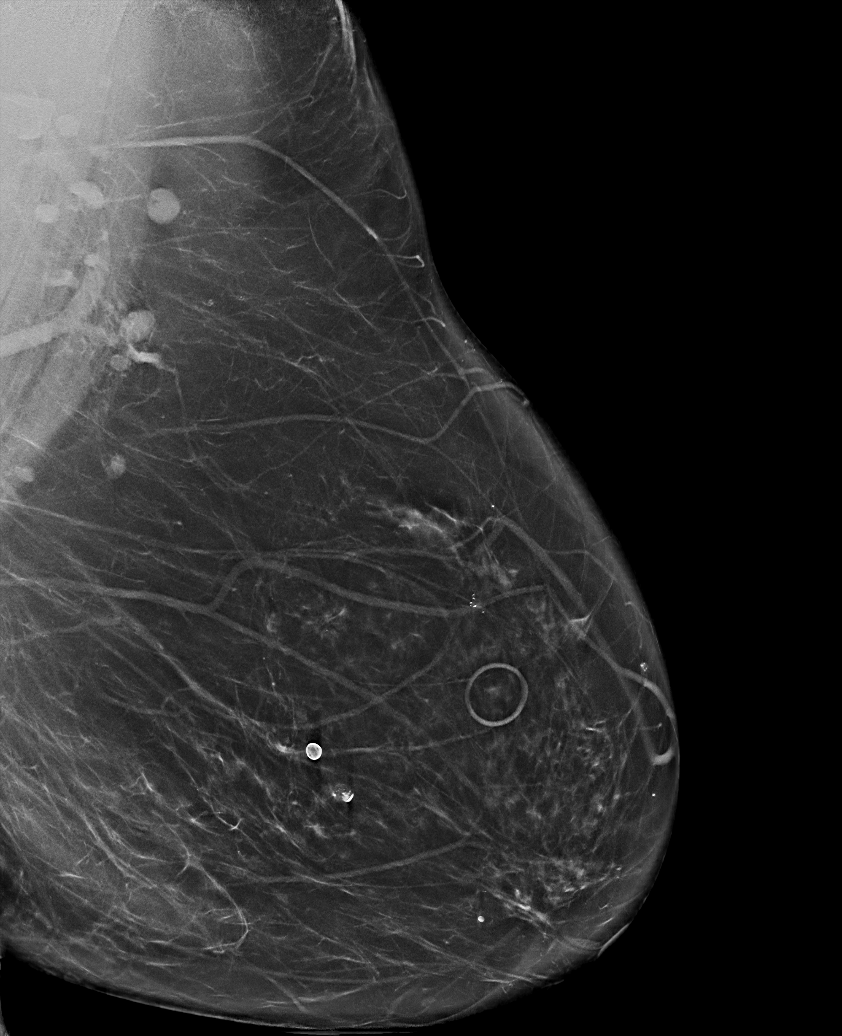

[L MLO synth-2D (2 of 2)]
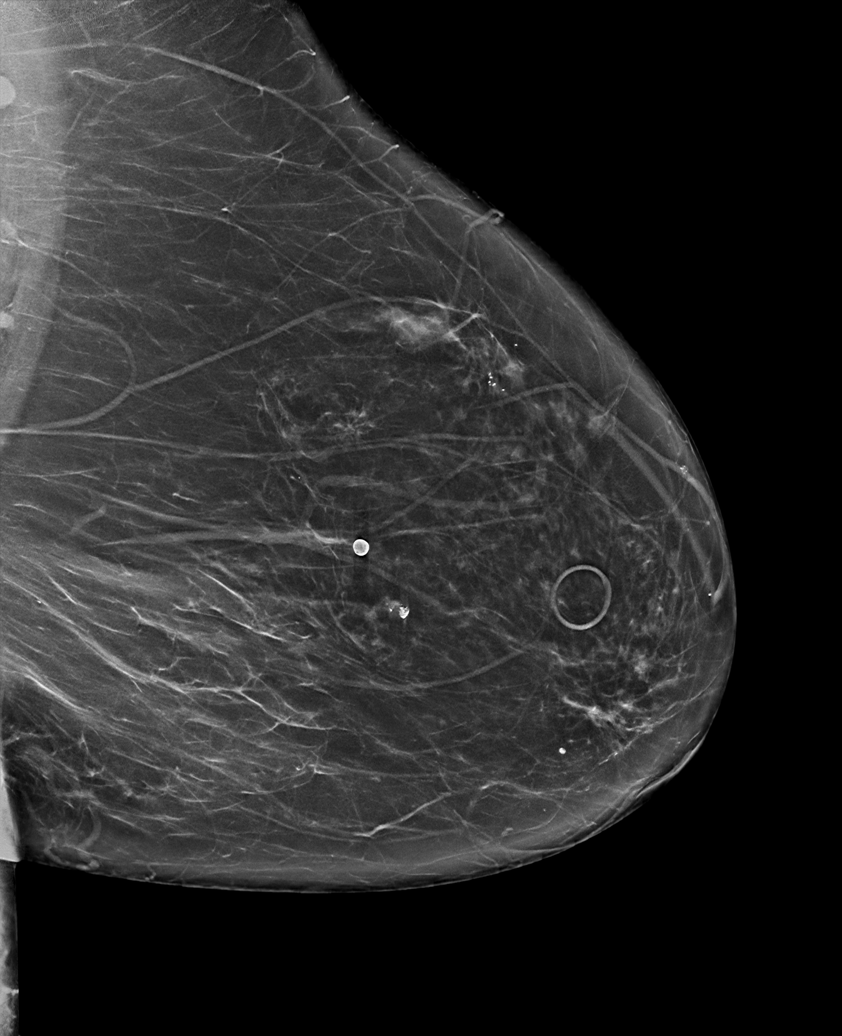

[R MLO synth-2D]
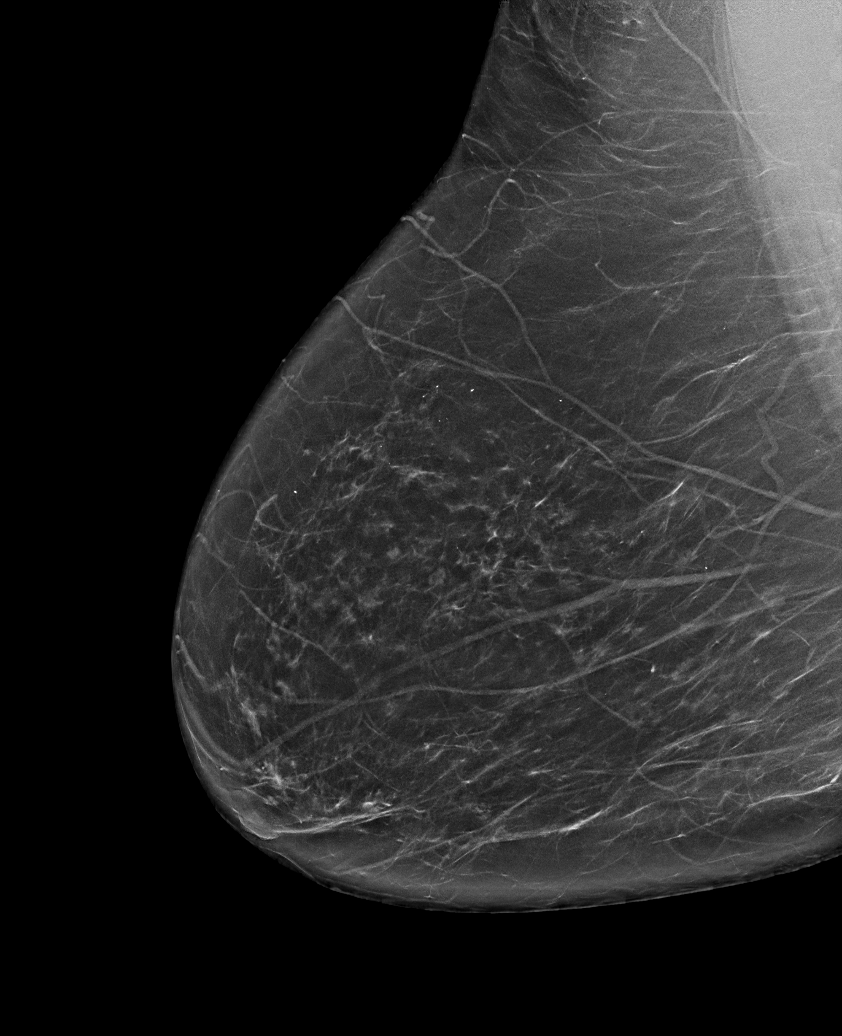

[6 of 36 positions shown; findings below may reference images not displayed]

ACR Breast Density Category b: There are scattered areas of
fibroglandular density.
FINDINGS: In the left breast, possible distortion warrants further evaluation.
In the right breast, no findings suspicious for malignancy.
IMPRESSION: Further evaluation is suggested for possible distortion in the left
breast.

RECOMMENDATION:
Diagnostic mammogram and possibly ultrasound of the left breast.
(Code:L0-3-TT3)

The patient will be contacted regarding the findings, and additional
imaging will be scheduled.

BI-RADS CATEGORY  0: Incomplete. Need additional imaging evaluation
and/or prior mammograms for comparison.

## 2024-03-28 ENCOUNTER — Telehealth: Payer: Self-pay

## 2024-03-28 NOTE — Telephone Encounter (Signed)
 Spoke with patient to reschedule previous CT. Pt still has a cold. Encouraged patient to go see PCP for evaluation and treatment. Per patient "I think it is just allergies." Advised we will f/u again in one month for an update.

## 2024-04-03 ENCOUNTER — Ambulatory Visit

## 2024-04-03 VITALS — Ht 62.0 in | Wt 194.0 lb

## 2024-04-03 DIAGNOSIS — Z0000 Encounter for general adult medical examination without abnormal findings: Secondary | ICD-10-CM

## 2024-04-03 DIAGNOSIS — Z1231 Encounter for screening mammogram for malignant neoplasm of breast: Secondary | ICD-10-CM

## 2024-04-03 NOTE — Progress Notes (Signed)
 Please attest and cosign this visit due to patients primary care provider not being in the office at the time the visit was completed.   Subjective:   Maria Campbell is a 58 y.o. who presents for a Medicare Wellness preventive visit.  Visit Complete: Virtual I connected with  Cheryl Corporal on 04/03/24 by a audio enabled telemedicine application and verified that I am speaking with the correct person using two identifiers.  Patient Location: Home  Provider Location: Home Office  I discussed the limitations of evaluation and management by telemedicine. The patient expressed understanding and agreed to proceed.  Vital Signs: Because this visit was a virtual/telehealth visit, some criteria may be missing or patient reported. Any vitals not documented were not able to be obtained and vitals that have been documented are patient reported.  VideoDeclined- This patient declined Librarian, academic. Therefore the visit was completed with audio only.  AWV Questionnaire: No: Patient Medicare AWV questionnaire was not completed prior to this visit.  Cardiac Risk Factors include: advanced age (>40men, >39 women);obesity (BMI >30kg/m2);smoking/ tobacco exposure;Other (see comment), Risk factor comments: hx of seizures, stroke     Objective:     Today's Vitals   04/03/24 0918  Weight: 194 lb (88 kg)  Height: 5\' 2"  (1.575 m)   Body mass index is 35.48 kg/m.     04/03/2024    9:48 AM 02/13/2024    9:39 AM 08/02/2023    8:01 AM 07/31/2023   11:40 AM 07/25/2023   11:49 AM 03/28/2023    2:35 PM 01/19/2023   10:51 AM  Advanced Directives  Does Patient Have a Medical Advance Directive? No No No No No No No  Would patient like information on creating a medical advance directive? Yes (MAU/Ambulatory/Procedural Areas - Information given)   No - Patient declined  No - Patient declined     Current Medications (verified) Outpatient Encounter Medications as of 04/03/2024   Medication Sig   aspirin  EC 81 MG tablet Take 1 tablet (81 mg total) by mouth at bedtime.   atorvastatin  (LIPITOR) 20 MG tablet Take 1 tablet (20 mg total) by mouth daily.   dapagliflozin  propanediol (FARXIGA ) 10 MG TABS tablet Take 1 tablet (10 mg total) by mouth daily before breakfast.   gabapentin  (NEURONTIN ) 300 MG capsule Take 1 capsule (300 mg total) by mouth daily.   lamoTRIgine  (LAMICTAL ) 100 MG tablet Take 1 tablet every night   nortriptyline  (PAMELOR ) 50 MG capsule Take 1 capsule (50 mg total) by mouth at bedtime.   omeprazole  (PRILOSEC) 40 MG capsule Take 1 capsule (40 mg total) by mouth daily.   polyethylene glycol powder (GLYCOLAX /MIRALAX ) 17 GM/SCOOP powder Take 8.5 g by mouth daily.   Vitamin D , Ergocalciferol , (DRISDOL ) 1.25 MG (50000 UNIT) CAPS capsule Take 1 capsule (50,000 Units total) by mouth every 7 (seven) days.   No facility-administered encounter medications on file as of 04/03/2024.    Allergies (verified) Meperidine, Other, Bee venom, and Dakin's [sodium hypochlorite]   History: Past Medical History:  Diagnosis Date   Arthritis    Barrett's esophagus    CKD (chronic kidney disease) stage 3, GFR 30-59 ml/min (HCC)    Dyspnea    GERD (gastroesophageal reflux disease)    History of kidney stones    Hyperlipidemia    Kidney stones    Lung mass    PET negative on 05/22/18   Seizures (HCC)    Stroke (HCC)    Vitamin D  deficiency  Past Surgical History:  Procedure Laterality Date   BIOPSY  08/02/2023   Procedure: BIOPSY;  Surgeon: Hargis Lias, MD;  [facility];  Service: Endoscopy;;   BREAST BIOPSY WITH RADIO FREQUENCY LOCALIZER Left 06/28/2022   Procedure: BREAST BIOPSY WITH RADIO FREQUENCY LOCALIZER;  Surgeon: Marijo Shove, DO;  Location: [facility];  Service: General;  Laterality: Left;   CYSTOSCOPY W/ URETERAL STENT PLACEMENT  05/05/2021   Procedure: CYSTOSCOPY WITH LEFT URETERAL STENT REPLACEMENT;  Surgeon: Marco Severs,  MD;  Location: [facility];  Service: Urology;;   CYSTOSCOPY WITH RETROGRADE PYELOGRAM, URETEROSCOPY AND STENT PLACEMENT Left 05/05/2021   Procedure: CYSTOSCOPY WITH BILATERAL RETROGRADE PYELOGRAM, BILATERAL URETEROSCOPY,  RIGHT URETERAL STENT PLACEMENT;  Surgeon: Marco Severs, MD;  Location: [facility];  Service: Urology;  Laterality: Left;   ESOPHAGOGASTRODUODENOSCOPY (EGD) WITH PROPOFOL  N/A 08/02/2023   Procedure: ESOPHAGOGASTRODUODENOSCOPY (EGD) WITH PROPOFOL ;  Surgeon: Hargis Lias, MD;  [facility]: [facility];  Service: Endoscopy;  Laterality: N/A;  9:30am;asa 3   FLEXIBLE SIGMOIDOSCOPY N/A 08/02/2023   Procedure: FLEXIBLE SIGMOIDOSCOPY;  Surgeon: Hargis Lias, MD;  [facility]: [facility];  Service: Endoscopy;  Laterality: N/A;  9:30am;asa 3   HIP SURGERY Left    HOLMIUM LASER APPLICATION Left 05/05/2021   Procedure: HOLMIUM LASER LITHOTRIPSY RIGHT URETERAL CALCULUS;  Surgeon: Marco Severs, MD;  Location: [facility];  Service: Urology;  Laterality: Left;   KNEE SURGERY Left    STONE EXTRACTION WITH BASKET  05/05/2021   Procedure: LEFT URETERAL STONE EXTRACTION WITH BASKET;  Surgeon: Marco Severs, MD;  Location: [facility];  Service: Urology;;   Family History  Problem Relation Age of Onset   Heart disease Mother    Heart attack Mother    Heart disease Father    Heart failure Father    Heart disease Maternal Grandmother    Heart disease Maternal Grandfather    Diabetes Paternal Grandmother    Breast cancer Neg Hx    Social History   Socioeconomic History   Marital status: Single    Spouse name: Not on file   Number of children: 1   Years of education: Not on file   Highest education level: 8th grade  Occupational History   Occupation: disability due to seizures and strokes  Tobacco Use   Smoking status: Every Day    Current packs/day: 1.00    Average packs/day: 1 pack/day for 49.4 years (49.4 ttl pk-yrs)    Types: Cigarettes    Start date: 1976   Smokeless  tobacco: Never  Vaping Use   Vaping status: Never Used  Substance and Sexual Activity   Alcohol use: Never   Drug use: Never   Sexual activity: Not Currently    Birth control/protection: None  Other Topics Concern   Not on file  Social History Narrative   Lives with a roommate - has one son - he lives hours away   Right handed       04/03/2024: has started communicating with her son again after 5 years. Spoke with him for the first time in 5 years on Mothers Day this year   Social Drivers of Corporate investment banker Strain: Low Risk  (04/03/2024)   Overall Financial Resource Strain (CARDIA)    Difficulty of Paying Living Expenses: Not hard at all  Food Insecurity: No Food Insecurity (04/03/2024)   Hunger Vital Sign    Worried About Running Out of Food in the Last Year: Never  true    Ran Out of Food in the Last Year: Never true  Transportation Needs: No Transportation Needs (04/03/2024)   PRAPARE - Administrator, Civil Service (Medical): No    Lack of Transportation (Non-Medical): No  Physical Activity: Sufficiently Active (04/03/2024)   Exercise Vital Sign    Days of Exercise per Week: 7 days    Minutes of Exercise per Session: 30 min  Stress: No Stress Concern Present (04/03/2024)   [employer] of Occupational Health - Occupational Stress Questionnaire    Feeling of Stress : Not at all  Social Connections: Socially Isolated (04/03/2024)   Social Connection and Isolation Panel [NHANES]    Frequency of Communication with Friends and Family: More than three times a week    Frequency of Social Gatherings with Friends and Family: More than three times a week    Attends Religious Services: Never    Database administrator or Organizations: No    Attends Engineer, structural: Never    Marital Status: Never married    Tobacco Counseling Ready to quit: No Counseling given: Yes    Clinical Intake:  Pre-visit preparation completed: Yes  Pain :  No/denies pain     BMI - recorded: 35.48 Nutritional Status: BMI > 30  Obese Nutritional Risks: None Diabetes: No  How often do you need to have someone help you when you read instructions, pamphlets, or other written materials from your doctor or pharmacy?: 1 - Never  Interpreter Needed?: No  Information entered by :: Sally Crazier CMA   Activities of Daily Living     04/03/2024    9:36 AM 07/31/2023   11:41 AM  In your present state of health, do you have any difficulty performing the following activities:  Hearing? 0   Vision? 0   Difficulty concentrating or making decisions? 0   Walking or climbing stairs? 0   Dressing or bathing? 0   Doing errands, shopping? 0 0  Preparing Food and eating ? N   Using the Toilet? N   In the past six months, have you accidently leaked urine? N   Do you have problems with loss of bowel control? N   Managing your Medications? N   Managing your Finances? N   Housekeeping or managing your Housekeeping? N     Patient Care Team: Galvin Jules, FNP as PCP - General (Family Medicine) Hyland Mailman, MD as Referring Physician (Optometry) Ty Gales Lyna Sandhoff, MD as Consulting Physician (Neurology) Claretta Croft Arden Beck, MD as Consulting Physician (Urology)  Indicate any recent Medical Services you may have received from other than [facility] providers in the past year (date may be approximate).     Assessment:    This is a routine wellness examination for Tamaris.  Hearing/Vision screen Hearing Screening - Comments:: Patient denies any hearing difficulties.   Vision Screening - Comments:: Patient is not up to date on yearly eye exams.  She last saw Dr. Buck Carbon at the [facility] in [place] in [place]. She will call them for an appointment to update her exams.    Goals Addressed             This Visit's Progress    Patient Stated       I'd like to see my son and grandchildren. I haven't seen him in 5 years and I spoke with him for the first time in 5  years on Mothers Day.  Depression Screen     04/03/2024    9:52 AM 01/17/2024    8:55 AM 04/19/2023   10:09 AM 03/28/2023    2:34 PM 12/21/2022   11:03 AM 10/31/2022   12:02 PM 07/31/2022    1:32 PM  PHQ 2/9 Scores  PHQ - 2 Score 0 2 0 1 3 3  0  PHQ- 9 Score 0 4 4  11 12      Fall Risk     04/03/2024    9:38 AM 02/13/2024    9:39 AM 01/17/2024    8:54 AM 07/25/2023   11:49 AM 04/19/2023   10:09 AM  Fall Risk   Falls in the past year? 0 0 1 0 1  Number falls in past yr: 0 0 1 0 1  Injury with Fall? 0 0 0 0 1  Risk for fall due to : No Fall Risks  History of fall(s)    Follow up Falls prevention discussed;Falls evaluation completed Falls evaluation completed Falls evaluation completed Falls evaluation completed Falls prevention discussed    MEDICARE RISK AT HOME:  Medicare Risk at Home Any stairs in or around the home?: Yes If so, are there any without handrails?: No Home free of loose throw rugs in walkways, pet beds, electrical cords, etc?: Yes Adequate lighting in your home to reduce risk of falls?: Yes Life alert?: No Use of a cane, walker or w/c?: No Grab bars in the bathroom?: No Shower chair or bench in shower?: No Elevated toilet seat or a handicapped toilet?: No  TIMED UP AND GO:  Was the test performed?  No  Cognitive Function: 6CIT completed        04/03/2024    9:40 AM 03/28/2023    2:35 PM 07/31/2022    1:33 PM 07/28/2021    1:16 PM  6CIT Screen  What Year? 0 points 0 points 0 points 0 points  What month? 0 points 0 points 0 points 0 points  What time? 0 points 0 points 0 points 0 points  Count back from 20 0 points 0 points 2 points 0 points  Months in reverse 0 points 0 points 0 points 2 points  Repeat phrase 0 points 0 points 0 points 6 points  Total Score 0 points 0 points 2 points 8 points    Immunizations Immunization History  Administered Date(s) Administered   Zoster Recombinant(Shingrix ) 01/04/2022, 10/31/2022    Screening Tests Health  Maintenance  Topic Date Due   COVID-19 Vaccine (1) Never done   Cervical Cancer Screening (HPV/Pap Cotest)  Never done   Lung Cancer Screening  05/06/2019   MAMMOGRAM  04/19/2023   DTaP/Tdap/Td (1 - Tdap) 01/16/2025 (Originally 04/29/1985)   Pneumococcal Vaccine 26-66 Years old (1 of 2 - PCV) 01/16/2025 (Originally 04/29/1985)   INFLUENZA VACCINE  06/20/2024   Medicare Annual Wellness (AWV)  04/03/2025   Colonoscopy  08/01/2033   Hepatitis C Screening  Completed   HIV Screening  Completed   Zoster Vaccines- Shingrix   Completed   HPV VACCINES  Aged Out   Meningococcal B Vaccine  Aged Out    Health Maintenance  Health Maintenance Due  Topic Date Due   COVID-19 Vaccine (1) Never done   Cervical Cancer Screening (HPV/Pap Cotest)  Never done   Lung Cancer Screening  05/06/2019   MAMMOGRAM  04/19/2023  Patient was sick at the time her lung cancer screening as scheduled. She has the number to call and reschedule that and states that she will.  Health Maintenance Items Addressed: Mammogram ordered, Discussed advanced directives. Patient requesting a copy to completed and designate her son as her Management consultant. She is also requesting to update her DPR to reflect her son as her emergency contact and someone that we can speak to and release information to. Patient advised of how to change information and verbalized understanding. Mammogram also scheduled with patient.   Additional Screening:  Vision Screening: Recommended annual ophthalmology exams for early detection of glaucoma and other disorders of the eye.  Dental Screening: Recommended annual dental exams for proper oral hygiene  Community Resource Referral / Chronic Care Management: CRR required this visit?  No   CCM required this visit?  No     Plan:     I have personally reviewed and noted the following in the patient's chart:   Medical and social history Use of alcohol, tobacco or illicit drugs  Current medications  and supplements including opioid prescriptions. Patient is not currently taking opioid prescriptions. Functional ability and status Nutritional status Physical activity Advanced directives List of other physicians Hospitalizations, surgeries, and ER visits in previous 12 months Vitals Screenings to include cognitive, depression, and falls Referrals and appointments  In addition, I have reviewed and discussed with patient certain preventive protocols, quality metrics, and best practice recommendations. A written personalized care plan for preventive services as well as general preventive health recommendations were provided to patient.     Faren Florence, CMA   04/03/2024   After Visit Summary: (Mail) Due to this being a telephonic visit, the after visit summary with patients personalized plan was offered to patient via mail   Notes: Please refer to Routing Comments.

## 2024-04-03 NOTE — Patient Instructions (Signed)
 Ms. Cassaday , Thank you for taking time out of your busy schedule to complete your Annual Wellness Visit with me. I enjoyed our conversation and look forward to speaking with you again next year. I, as well as your care team,  appreciate your ongoing commitment to your health goals. Please review the following plan we discussed and let me know if I can assist you in the future.  Your Game plan/ To Do List     Referrals: If you haven't heard from the office you've been referred to, please reach out to them at the phone number provided.  We scheduled you mammogram for the mobile mammogram bus.  Apr 16, 2024 at 9:00 at [facility] in their parking lot   Please call and reschedule your lung cancer screening  Follow up Visits:  Next Medicare AWV with our clinical staff: Apr 06, 2025 at 1:50 pm telephone visit    Have you seen your provider in the last 6 months (3 months if uncontrolled diabetes)? yes  Next Office Visit with your provider: July 16, 2024 at 8:05 with Evalyn Hillier, FNP  Clinician Recommendations:    Aim for 30 minutes of exercise or brisk walking, 6-8 glasses of water , and 5 servings of fruits and vegetables each day.   I enjoyed our conversation today and look forward to talking with you again next year!! Have a wonderful and safe year. All the best, Kauri Garson      This is a list of the screening recommended for you and due dates:  Health Maintenance  Topic Date Due   COVID-19 Vaccine (1) Never done   Pap with HPV screening  Never done   Screening for Lung Cancer  05/06/2019   Mammogram  04/19/2023   DTaP/Tdap/Td vaccine (1 - Tdap) 01/16/2025*   Pneumococcal Vaccination (1 of 2 - PCV) 01/16/2025*   Flu Shot  06/20/2024   Medicare Annual Wellness Visit  04/03/2025   Colon Cancer Screening  08/01/2033   Hepatitis C Screening  Completed   HIV Screening  Completed   Zoster (Shingles) Vaccine  Completed   HPV Vaccine  Aged Out   Meningitis B Vaccine  Aged Out  *Topic was  postponed. The date shown is not the original due date.    Advanced directives: (Provided) Advance directive discussed with you today. I have provided a copy for you to complete at home and have notarized. Once this is complete, please bring a copy in to our office so we can scan it into your chart.  Advance Care Planning is important because it:  [x]  Makes sure you receive the medical care that is consistent with your values, goals, and preferences  [x]  It provides guidance to your family and loved ones and reduces their decisional burden about whether or not they are making the right decisions based on your wishes.  Follow the link provided in your after visit summary or read over the paperwork we have mailed to you to help you started getting your Advance Directives in place. If you need assistance in completing these, please reach out to us  so that we can help you!  See attachments for Preventive Care and Fall Prevention Tips.   Understanding Your Risk for Falls  Millions of people have serious injuries from falls each year. It is important to understand your risk of falling. Talk with your health care provider about your risk and what you can do to lower it. If you do have a serious fall, make sure  to tell your provider. Falling once raises your risk of falling again. How can falls affect me? Serious injuries from falls are common. These include: Broken bones, such as hip fractures. Head injuries, such as traumatic brain injuries (TBI) or concussions. A fear of falling can cause you to avoid activities and stay at home. This can make your muscles weaker and raise your risk for a fall. What can increase my risk? There are a number of risk factors that increase your risk for falling. The more risk factors you have, the higher your risk of falling. Serious injuries from a fall happen most often to people who are older than 59 years old. Teenagers and young adults ages 68-29 are also at  higher risk. Common risk factors include: Weakness in the lower body. Being generally weak or confused due to long-term (chronic) illness. Dizziness or balance problems. Poor vision. Medicines that cause dizziness or drowsiness. These may include: Medicines for your blood pressure, heart, anxiety, insomnia, or swelling (edema). Pain medicines. Muscle relaxants. Other risk factors include: Drinking alcohol. Having had a fall in the past. Having foot pain or wearing improper footwear. Working at a dangerous job. Having any of the following in your home: Tripping hazards, such as floor clutter or loose rugs. Poor lighting. Pets. Having dementia or memory loss. What actions can I take to lower my risk of falling?     Physical activity Stay physically fit. Do strength and balance exercises. Consider taking a regular class to build strength and balance. Yoga and tai chi are good options. Vision Have your eyes checked every year and your prescription for glasses or contacts updated as needed. Shoes and walking aids Wear non-skid shoes. Wear shoes that have rubber soles and low heels. Do not wear high heels. Do not walk around the house in socks or slippers. Use a cane or walker as told by your provider. Home safety Attach secure railings on both sides of your stairs. Install grab bars for your bathtub, shower, and toilet. Use a non-skid mat in your bathtub or shower. Attach bath mats securely with double-sided, non-slip rug tape. Use good lighting in all rooms. Keep a flashlight near your bed. Make sure there is a clear path from your bed to the bathroom. Use night-lights. Do not use throw rugs. Make sure all carpeting is taped or tacked down securely. Remove all clutter from walkways and stairways, including extension cords. Repair uneven or broken steps and floors. Avoid walking on icy or slippery surfaces. Walk on the grass instead of on icy or slick sidewalks. Use ice melter to  get rid of ice on walkways in the winter. Use a cordless phone. Questions to ask your health care provider Can you help me check my risk for a fall? Do any of my medicines make me more likely to fall? Should I take a vitamin D  supplement? What exercises can I do to improve my strength and balance? Should I make an appointment to have my vision checked? Do I need a bone density test to check for weak bones (osteoporosis)? Would it help to use a cane or a walker? Where to find more information Centers for Disease Control and Prevention, STEAD[employer]: TonerPromos.no Community-Based Fall Prevention Programs: TonerPromos.no [employer] on Aging: BaseRingTones.pl Contact a health care provider if: You fall at home. You are afraid of falling at home. You feel weak, drowsy, or dizzy. This information is not intended to replace advice given to you by your health care provider.  Make sure you discuss any questions you have with your health care provider. Document Revised: 07/10/2022 Document Reviewed: 07/10/2022 Elsevier Patient Education  2024 [employer]. Understanding Your Risk for Falls Millions of people have serious injuries from falls each year. It is important to understand your risk of falling. Talk with your health care provider about your risk and what you can do to lower it. If you do have a serious fall, make sure to tell your provider. Falling once raises your risk of falling again. How can falls affect me? Serious injuries from falls are common. These include: Broken bones, such as hip fractures. Head injuries, such as traumatic brain injuries (TBI) or concussions. A fear of falling can cause you to avoid activities and stay at home. This can make your muscles weaker and raise your risk for a fall. What can increase my risk? There are a number of risk factors that increase your risk for falling. The more risk factors you have, the higher your risk of falling. Serious injuries from a fall happen most  often to people who are older than 59 years old. Teenagers and young adults ages 56-29 are also at higher risk. Common risk factors include: Weakness in the lower body. Being generally weak or confused due to long-term (chronic) illness. Dizziness or balance problems. Poor vision. Medicines that cause dizziness or drowsiness. These may include: Medicines for your blood pressure, heart, anxiety, insomnia, or swelling (edema). Pain medicines. Muscle relaxants. Other risk factors include: Drinking alcohol. Having had a fall in the past. Having foot pain or wearing improper footwear. Working at a dangerous job. Having any of the following in your home: Tripping hazards, such as floor clutter or loose rugs. Poor lighting. Pets. Having dementia or memory loss. What actions can I take to lower my risk of falling?     Physical activity Stay physically fit. Do strength and balance exercises. Consider taking a regular class to build strength and balance. Yoga and tai chi are good options. Vision Have your eyes checked every year and your prescription for glasses or contacts updated as needed. Shoes and walking aids Wear non-skid shoes. Wear shoes that have rubber soles and low heels. Do not wear high heels. Do not walk around the house in socks or slippers. Use a cane or walker as told by your provider. Home safety Attach secure railings on both sides of your stairs. Install grab bars for your bathtub, shower, and toilet. Use a non-skid mat in your bathtub or shower. Attach bath mats securely with double-sided, non-slip rug tape. Use good lighting in all rooms. Keep a flashlight near your bed. Make sure there is a clear path from your bed to the bathroom. Use night-lights. Do not use throw rugs. Make sure all carpeting is taped or tacked down securely. Remove all clutter from walkways and stairways, including extension cords. Repair uneven or broken steps and floors. Avoid walking on  icy or slippery surfaces. Walk on the grass instead of on icy or slick sidewalks. Use ice melter to get rid of ice on walkways in the winter. Use a cordless phone. Questions to ask your health care provider Can you help me check my risk for a fall? Do any of my medicines make me more likely to fall? Should I take a vitamin D  supplement? What exercises can I do to improve my strength and balance? Should I make an appointment to have my vision checked? Do I need a bone density test to check  for weak bones (osteoporosis)? Would it help to use a cane or a walker? Where to find more information Centers for Disease Control and Prevention, [employer]: TonerPromos.no Community-Based Fall Prevention Programs: TonerPromos.no [employer] on Aging: BaseRingTones.pl Contact a health care provider if: You fall at home. You are afraid of falling at home. You feel weak, drowsy, or dizzy. This information is not intended to replace advice given to you by your health care provider. Make sure you discuss any questions you have with your health care provider. Document Revised: 07/10/2022 Document Reviewed: 07/10/2022 Elsevier Patient Education  2024 [employer].

## 2024-04-16 ENCOUNTER — Ambulatory Visit
Admission: RE | Admit: 2024-04-16 | Discharge: 2024-04-16 | Disposition: A | Discharge status: Home / Self Care | Source: Ambulatory Visit | Attending: Family Medicine | Admitting: Family Medicine

## 2024-04-16 ENCOUNTER — Ambulatory Visit: Admitting: Family Medicine

## 2024-04-16 ENCOUNTER — Encounter: Payer: Self-pay | Admitting: Family Medicine

## 2024-04-16 VITALS — BP 104/74 | HR 83 | Temp 97.3°F | Ht 62.0 in | Wt 192.0 lb

## 2024-04-16 DIAGNOSIS — S1086XA Insect bite of other specified part of neck, initial encounter: Secondary | ICD-10-CM

## 2024-04-16 DIAGNOSIS — W57XXXA Bitten or stung by nonvenomous insect and other nonvenomous arthropods, initial encounter: Secondary | ICD-10-CM

## 2024-04-16 DIAGNOSIS — Z1231 Encounter for screening mammogram for malignant neoplasm of breast: Secondary | ICD-10-CM | POA: Diagnosis not present

## 2024-04-16 DIAGNOSIS — L089 Local infection of the skin and subcutaneous tissue, unspecified: Secondary | ICD-10-CM | POA: Diagnosis not present

## 2024-04-16 MED ORDER — DOXYCYCLINE HYCLATE 100 MG PO TABS: 100.0000 mg | ORAL_TABLET | Freq: Two times a day (BID) | ORAL | 0 refills | Status: AC

## 2024-04-16 NOTE — Progress Notes (Signed)
 Subjective:  Patient ID: Maria Campbell, female    DOB: Oct 27, 1966, 58 y.o.   MRN: [id number]  Patient Care Team: Galvin Jules, FNP as PCP - General (Family Medicine) Hyland Mailman, MD as Referring Physician (Optometry) Ty Gales Lyna Sandhoff, MD as Consulting Physician (Neurology) Claretta Croft Arden Beck, MD as Consulting Physician (Urology)   Chief Complaint:  Insect Bite (Bug bites on right shoulder. Sunday was as under tree for cookout and was bitten by either spider or ticks. The bites are itchy.)   HPI: Maria Campbell is a 58 y.o. female presenting on 04/16/2024 for Insect Bite (Bug bites on right shoulder. Sunday was as under tree for cookout and was bitten by either spider or ticks. The bites are itchy.)   HPI On Sunday she was outside under a tree cooking. Noticed a tick on her shorts, does not believe that it bit her. Then on Monday noticed two spots on her neck that are itching and sore to touch. States that she has cleaned it in the shower and put peroxide on it.   Relevant past medical, surgical, family, and social history reviewed and updated as indicated.  Allergies and medications reviewed and updated. Data reviewed: Chart in Epic.   Past Medical History:  Diagnosis Date   Arthritis    Barrett's esophagus    CKD (chronic kidney disease) stage 3, GFR 30-59 ml/min (HCC)    Dyspnea    GERD (gastroesophageal reflux disease)    History of kidney stones    Hyperlipidemia    Kidney stones    Lung mass    PET negative on 05/22/18   Seizures (HCC)    Stroke (HCC)    Vitamin D  deficiency     Past Surgical History:  Procedure Laterality Date   BIOPSY  08/02/2023   Procedure: BIOPSY;  Surgeon: Hargis Lias, MD;  Location: [facility];  Service: Endoscopy;;   BREAST BIOPSY WITH RADIO FREQUENCY LOCALIZER Left 06/28/2022   Procedure: BREAST BIOPSY WITH RADIO FREQUENCY LOCALIZER;  Surgeon: Marijo Shove, DO;  Location: [facility];  Service: General;  Laterality:  Left;   CYSTOSCOPY W/ URETERAL STENT PLACEMENT  05/05/2021   Procedure: CYSTOSCOPY WITH LEFT URETERAL STENT REPLACEMENT;  Surgeon: Marco Severs, MD;  Location: [facility];  Service: Urology;;   CYSTOSCOPY WITH RETROGRADE PYELOGRAM, URETEROSCOPY AND STENT PLACEMENT Left 05/05/2021   Procedure: CYSTOSCOPY WITH BILATERAL RETROGRADE PYELOGRAM, BILATERAL URETEROSCOPY,  RIGHT URETERAL STENT PLACEMENT;  Surgeon: Marco Severs, MD;  Location: [facility];  Service: Urology;  Laterality: Left;   ESOPHAGOGASTRODUODENOSCOPY (EGD) WITH PROPOFOL  N/A 08/02/2023   Procedure: ESOPHAGOGASTRODUODENOSCOPY (EGD) WITH PROPOFOL ;  Surgeon: Hargis Lias, MD;  Location: [facility];  Service: Endoscopy;  Laterality: N/A;  9:30am;asa 3   FLEXIBLE SIGMOIDOSCOPY N/A 08/02/2023   Procedure: FLEXIBLE SIGMOIDOSCOPY;  Surgeon: Hargis Lias, MD;  Location: [facility];  Service: Endoscopy;  Laterality: N/A;  9:30am;asa 3   HIP SURGERY Left    HOLMIUM LASER APPLICATION Left 05/05/2021   Procedure: HOLMIUM LASER LITHOTRIPSY RIGHT URETERAL CALCULUS;  Surgeon: Marco Severs, MD;  Location: [facility];  Service: Urology;  Laterality: Left;   KNEE SURGERY Left    STONE EXTRACTION WITH BASKET  05/05/2021   Procedure: LEFT URETERAL STONE EXTRACTION WITH BASKET;  Surgeon: Marco Severs, MD;  Location: [facility];  Service: Urology;;    Social History   Socioeconomic History   Marital status: Single    Spouse  name: Not on file   Number of children: 1   Years of education: Not on file   Highest education level: 8th grade  Occupational History   Occupation: disability due to seizures and strokes  Tobacco Use   Smoking status: Every Day    Current packs/day: 1.00    Average packs/day: 1 pack/day for 49.4 years (49.4 ttl pk-yrs)    Types: Cigarettes    Start date: 1976   Smokeless tobacco: Never  Vaping Use   Vaping status: Never Used  Substance and Sexual Activity   Alcohol use: Never   Drug use: Never    Sexual activity: Not Currently    Birth control/protection: None  Other Topics Concern   Not on file  Social History Narrative   Lives with a roommate - has one son - he lives hours away   Right handed       04/03/2024: has started communicating with her son again after 5 years. Spoke with him for the first time in 5 years on Mothers Day this year   Social Drivers of Corporate investment banker Strain: Low Risk  (04/03/2024)   Overall Financial Resource Strain (CARDIA)    Difficulty of Paying Living Expenses: Not hard at all  Food Insecurity: No Food Insecurity (04/03/2024)   Hunger Vital Sign    Worried About Running Out of Food in the Last Year: Never true    Ran Out of Food in the Last Year: Never true  Transportation Needs: No Transportation Needs (04/03/2024)   PRAPARE - Administrator, Civil Service (Medical): No    Lack of Transportation (Non-Medical): No  Physical Activity: Sufficiently Active (04/03/2024)   Exercise Vital Sign    Days of Exercise per Week: 7 days    Minutes of Exercise per Session: 30 min  Stress: No Stress Concern Present (04/03/2024)   [employer] of Occupational Health - Occupational Stress Questionnaire    Feeling of Stress : Not at all  Social Connections: Socially Isolated (04/03/2024)   Social Connection and Isolation Panel [NHANES]    Frequency of Communication with Friends and Family: More than three times a week    Frequency of Social Gatherings with Friends and Family: More than three times a week    Attends Religious Services: Never    Database administrator or Organizations: No    Attends Banker Meetings: Never    Marital Status: Never married  Intimate Partner Violence: Not At Risk (04/03/2024)   Humiliation, Afraid, Rape, and Kick questionnaire    Fear of Current or Ex-Partner: No    Emotionally Abused: No    Physically Abused: No    Sexually Abused: No    Outpatient Encounter Medications as of 04/16/2024   Medication Sig   aspirin  EC 81 MG tablet Take 1 tablet (81 mg total) by mouth at bedtime.   atorvastatin  (LIPITOR) 20 MG tablet Take 1 tablet (20 mg total) by mouth daily.   dapagliflozin  propanediol (FARXIGA ) 10 MG TABS tablet Take 1 tablet (10 mg total) by mouth daily before breakfast.   gabapentin  (NEURONTIN ) 300 MG capsule Take 1 capsule (300 mg total) by mouth daily.   lamoTRIgine  (LAMICTAL ) 100 MG tablet Take 1 tablet every night   nortriptyline  (PAMELOR ) 50 MG capsule Take 1 capsule (50 mg total) by mouth at bedtime.   omeprazole  (PRILOSEC) 40 MG capsule Take 1 capsule (40 mg total) by mouth daily.   polyethylene glycol powder (GLYCOLAX /MIRALAX ) 17  GM/SCOOP powder Take 8.5 g by mouth daily.   Vitamin D , Ergocalciferol , (DRISDOL ) 1.25 MG (50000 UNIT) CAPS capsule Take 1 capsule (50,000 Units total) by mouth every 7 (seven) days.   No facility-administered encounter medications on file as of 04/16/2024.    Allergies  Allergen Reactions   Meperidine Hives and Nausea And Vomiting    Fainting and n/v (Demerol)    Other     Bleach-chest tightness/nausea & vomiting/ felt as if I can't breathe   Bee Venom    Dakin's [Sodium Hypochlorite] Nausea And Vomiting    Acitve ingredient within bleach   Review of Systems As per HPI  Objective:  BP 104/74   Pulse 83   Temp (!) 97.3 F (36.3 C)   Ht 5\' 2"  (1.575 m)   Wt 192 lb (87.1 kg)   LMP  (LMP Unknown)   SpO2 91%   BMI 35.12 kg/m    Wt Readings from Last 3 Encounters:  04/16/24 192 lb (87.1 kg)  04/03/24 194 lb (88 kg)  02/13/24 194 lb 9.6 oz (88.3 kg)    Physical Exam Constitutional:      General: She is awake. She is not in acute distress.    Appearance: Normal appearance. She is obese. She is not ill-appearing, toxic-appearing or diaphoretic.  Cardiovascular:     Rate and Rhythm: Normal rate and regular rhythm.     Heart sounds: Normal heart sounds.  Pulmonary:     Effort: Pulmonary effort is normal.   Lymphadenopathy:     Head:     Right side of head: No submental, submandibular, tonsillar, preauricular or posterior auricular adenopathy.     Left side of head: No submental, submandibular, tonsillar, preauricular or posterior auricular adenopathy.     Cervical:     Right cervical: No superficial cervical adenopathy.    Left cervical: No superficial cervical adenopathy.  Skin:    General: Skin is warm.     Capillary Refill: Capillary refill takes less than 2 seconds.          Comments: Two lesions on right posterior neck with surrounding erythema, tender to touch, scant drainage, central opening with yellow fluid    Neurological:     General: No focal deficit present.     Mental Status: She is alert, oriented to person, place, and time and easily aroused. Mental status is at baseline.  Psychiatric:        Attention and Perception: Attention and perception normal.        Mood and Affect: Mood and affect normal.        Behavior: Behavior is cooperative.        Results for orders placed or performed in visit on 01/17/24  Bayer DCA Hb A1c Waived   Collection Time: 01/17/24  9:07 AM  Result Value Ref Range   HB A1C (BAYER DCA - WAIVED) 5.4 4.8 - 5.6 %  Lamotrigine  level   Collection Time: 01/17/24  9:09 AM  Result Value Ref Range   Lamotrigine  Lvl 4.0 2.0 - 20.0 ug/mL  CMP14+EGFR   Collection Time: 01/17/24  9:09 AM  Result Value Ref Range   Glucose 88 70 - 99 mg/dL   BUN 8 6 - 24 mg/dL   Creatinine, Ser 1.61 (H) 0.57 - 1.00 mg/dL   eGFR 56 (L) >09 UE/AVW/0.98   BUN/Creatinine Ratio 7 (L) 9 - 23   Sodium 141 134 - 144 mmol/L   Potassium 3.7 3.5 - 5.2 mmol/L  Chloride 102 96 - 106 mmol/L   CO2 24 20 - 29 mmol/L   Calcium  9.8 8.7 - 10.2 mg/dL   Total Protein 6.7 6.0 - 8.5 g/dL   Albumin 4.0 3.8 - 4.9 g/dL   Globulin, Total 2.7 1.5 - 4.5 g/dL   Bilirubin Total <5.0 0.0 - 1.2 mg/dL   Alkaline Phosphatase 124 (H) 44 - 121 IU/L   AST 9 0 - 40 IU/L   ALT 7 0 - 32 IU/L   CBC with Differential/Platelet   Collection Time: 01/17/24  9:09 AM  Result Value Ref Range   WBC 6.8 3.4 - 10.8 x10E3/uL   RBC 5.02 3.77 - 5.28 x10E6/uL   Hemoglobin 12.6 11.1 - 15.9 g/dL   Hematocrit 53.9 76.7 - 46.6 %   MCV 85 79 - 97 fL   MCH 25.1 (L) 26.6 - 33.0 pg   MCHC 29.6 (L) 31.5 - 35.7 g/dL   RDW 34.1 (H) 93.7 - 90.2 %   Platelets 229 150 - 450 x10E3/uL   Neutrophils 71 Not Estab. %   Lymphs 20 Not Estab. %   Monocytes 6 Not Estab. %   Eos 2 Not Estab. %   Basos 1 Not Estab. %   Neutrophils Absolute 4.8 1.4 - 7.0 x10E3/uL   Lymphocytes Absolute 1.3 0.7 - 3.1 x10E3/uL   Monocytes Absolute 0.4 0.1 - 0.9 x10E3/uL   EOS (ABSOLUTE) 0.1 0.0 - 0.4 x10E3/uL   Basophils Absolute 0.0 0.0 - 0.2 x10E3/uL   Immature Granulocytes 0 Not Estab. %   Immature Grans (Abs) 0.0 0.0 - 0.1 x10E3/uL  Lipid panel   Collection Time: 01/17/24  9:09 AM  Result Value Ref Range   Cholesterol, Total 165 100 - 199 mg/dL   Triglycerides 409 (H) 0 - 149 mg/dL   HDL 41 >73 mg/dL   VLDL Cholesterol Cal 29 5 - 40 mg/dL   LDL Chol Calc (NIH) 95 0 - 99 mg/dL   Chol/HDL Ratio 4.0 0.0 - 4.4 ratio  Thyroid  Panel With TSH   Collection Time: 01/17/24  9:09 AM  Result Value Ref Range   TSH 2.210 0.450 - 4.500 uIU/mL   T4, Total 8.0 4.5 - 12.0 ug/dL   T3 Uptake Ratio 22 (L) 24 - 39 %   Free Thyroxine Index 1.8 1.2 - 4.9  VITAMIN D  25 Hydroxy (Vit-D Deficiency, Fractures)   Collection Time: 01/17/24  9:09 AM  Result Value Ref Range   Vit D, 25-Hydroxy 7.4 (L) 30.0 - 100.0 ng/mL       04/03/2024    9:52 AM 01/17/2024    8:55 AM 04/19/2023   10:09 AM 03/28/2023    2:34 PM 12/21/2022   11:03 AM  Depression screen PHQ 2/9  Decreased Interest 0 2 0 1 2  Down, Depressed, Hopeless 0 0 0  1  PHQ - 2 Score 0 2 0 1 3  Altered sleeping 0 1 2  3   Tired, decreased energy 0 0 0  2  Change in appetite 0 1 2  2   Feeling bad or failure about yourself  0 0 0  0  Trouble concentrating 0 0 0  1  Moving slowly or  fidgety/restless 0 0 0  0  Suicidal thoughts 0 0 0  0  PHQ-9 Score 0 4 4  11   Difficult doing work/chores Not difficult at all Somewhat difficult Not difficult at all  Somewhat difficult       01/17/2024    8:55 AM  04/19/2023   10:09 AM 12/21/2022   11:04 AM 10/31/2022   12:03 PM  GAD 7 : Generalized Anxiety Score  Nervous, Anxious, on Edge 0 0 1 2  Control/stop worrying 1 2 1  0  Worry too much - different things 1 2 2  0  Trouble relaxing 1 1 2 3   Restless 0 0 2 0  Easily annoyed or irritable 0 0 2 0  Afraid - awful might happen 0 0 0 0  Total GAD 7 Score 3 5 10 5   Anxiety Difficulty Not difficult at all Not difficult at all Somewhat difficult Somewhat difficult      Pertinent labs & imaging results that were available during my care of the patient were reviewed by me and considered in my medical decision making.  Assessment & Plan:  Latanja was seen today for insect bite.  Diagnoses and all orders for this visit:  Insect bite of other part of neck, initial encounter Will start medication as below. Discussed with patient signs and symptoms to monitor and when to return. Recommend that patient clean, keep open. Do not recommend to continue hydrogen peroxide.  -     doxycycline  (VIBRA -TABS) 100 MG tablet; Take 1 tablet (100 mg total) by mouth 2 (two) times daily for 7 days.  Local skin infection As above.  -     doxycycline  (VIBRA -TABS) 100 MG tablet; Take 1 tablet (100 mg total) by mouth 2 (two) times daily for 7 days.  Continue all other maintenance medications.  Follow up plan: Return if symptoms worsen or fail to improve.   Continue healthy lifestyle choices, including diet (rich in fruits, vegetables, and lean proteins, and low in salt and simple carbohydrates) and exercise (at least 30 minutes of moderate physical activity daily).  Written and verbal instructions provided   The above assessment and management plan was discussed with the patient. The patient verbalized  understanding of and has agreed to the management plan. Patient is aware to call the clinic if they develop any new symptoms or if symptoms persist or worsen. Patient is aware when to return to the clinic for a follow-up visit. Patient educated on when it is appropriate to go to the emergency department.   Jacqualyn Mates, DNP-FNP [facility] [address] [place], [place] [address] [phone]

## 2024-04-22 ENCOUNTER — Other Ambulatory Visit: Payer: Self-pay | Admitting: Family Medicine

## 2024-04-22 DIAGNOSIS — R928 Other abnormal and inconclusive findings on diagnostic imaging of breast: Secondary | ICD-10-CM

## 2024-05-01 ENCOUNTER — Ambulatory Visit
Admission: RE | Admit: 2024-05-01 | Discharge: 2024-05-01 | Disposition: A | Discharge status: Home / Self Care | Source: Ambulatory Visit | Attending: Family Medicine | Admitting: Family Medicine

## 2024-05-01 DIAGNOSIS — R928 Other abnormal and inconclusive findings on diagnostic imaging of breast: Secondary | ICD-10-CM

## 2024-05-01 DIAGNOSIS — N6325 Unspecified lump in the left breast, overlapping quadrants: Secondary | ICD-10-CM | POA: Diagnosis not present

## 2024-05-26 ENCOUNTER — Ambulatory Visit (HOSPITAL_COMMUNITY)
Admission: RE | Admit: 2024-05-26 | Discharge: 2024-05-26 | Disposition: A | Discharge status: Home / Self Care | Source: Ambulatory Visit | Attending: Neurology | Admitting: Neurology

## 2024-05-26 DIAGNOSIS — F17210 Nicotine dependence, cigarettes, uncomplicated: Secondary | ICD-10-CM | POA: Diagnosis not present

## 2024-05-26 DIAGNOSIS — Z87891 Personal history of nicotine dependence: Secondary | ICD-10-CM | POA: Diagnosis not present

## 2024-05-26 DIAGNOSIS — Z122 Encounter for screening for malignant neoplasm of respiratory organs: Secondary | ICD-10-CM | POA: Insufficient documentation

## 2024-06-03 ENCOUNTER — Telehealth: Payer: Self-pay | Admitting: Acute Care

## 2024-06-03 ENCOUNTER — Other Ambulatory Visit: Payer: Self-pay

## 2024-06-03 DIAGNOSIS — Z122 Encounter for screening for malignant neoplasm of respiratory organs: Secondary | ICD-10-CM

## 2024-06-03 DIAGNOSIS — Z87891 Personal history of nicotine dependence: Secondary | ICD-10-CM

## 2024-06-03 DIAGNOSIS — F17210 Nicotine dependence, cigarettes, uncomplicated: Secondary | ICD-10-CM

## 2024-06-03 DIAGNOSIS — R911 Solitary pulmonary nodule: Secondary | ICD-10-CM

## 2024-06-03 NOTE — Telephone Encounter (Signed)
 Spoke with patient and reviewed results. She is in agreement to repeat scan in 8 weeks to evaluate an 11.4 mm nodule. She requested a call back closer to scan due date to schedule. No additional questions. Results and plans to PCP.

## 2024-06-03 NOTE — Telephone Encounter (Signed)
 This is the patient's first LDCT. Lauraine Lites NP has reviewed the scan and has recommended an 8 week follow up scan, due first of September. Will need to call patient with results and inform PCP of results and plan.    IMPRESSION: 1. Lung-RADS 4A, suspicious. Dominant nodular opacity in the posterior left lower lobe measuring 11.4 mm. Potentially atelectatic, close follow-up warranted. Follow up low-dose chest CT without contrast in 3 months (please use the following order, CT CHEST LCS NODULE FOLLOW-UP W/O CM) is recommended. Alternatively, PET may be considered when there is a solid component 8mm or larger. 2. Small hiatal hernia. 3. Aortic Atherosclerosis (ICD10-I70.0) and Emphysema (ICD10-J43.9).   These results will be called to the ordering clinician or representative by the Radiologist Assistant, and communication documented in the PACS or [employer].     Electronically Signed   By: Camellia Candle M.D.   On: 05/31/2024 07:04

## 2024-06-12 ENCOUNTER — Other Ambulatory Visit: Payer: Self-pay | Admitting: Family Medicine

## 2024-06-12 DIAGNOSIS — N1832 Chronic kidney disease, stage 3b: Secondary | ICD-10-CM

## 2024-07-05 ENCOUNTER — Other Ambulatory Visit: Payer: Self-pay | Admitting: Family Medicine

## 2024-07-05 DIAGNOSIS — K219 Gastro-esophageal reflux disease without esophagitis: Secondary | ICD-10-CM

## 2024-07-16 ENCOUNTER — Encounter: Payer: Self-pay | Admitting: Family Medicine

## 2024-07-16 ENCOUNTER — Encounter: Payer: Self-pay | Admitting: Acute Care

## 2024-07-16 ENCOUNTER — Ambulatory Visit: Payer: Medicare HMO | Admitting: Family Medicine

## 2024-07-16 VITALS — BP 107/84 | HR 86 | Temp 97.2°F | Ht 62.0 in | Wt 196.4 lb

## 2024-07-16 DIAGNOSIS — Z23 Encounter for immunization: Secondary | ICD-10-CM

## 2024-07-16 DIAGNOSIS — E782 Mixed hyperlipidemia: Secondary | ICD-10-CM | POA: Diagnosis not present

## 2024-07-16 DIAGNOSIS — Z0000 Encounter for general adult medical examination without abnormal findings: Secondary | ICD-10-CM

## 2024-07-16 DIAGNOSIS — Z0184 Encounter for antibody response examination: Secondary | ICD-10-CM

## 2024-07-16 DIAGNOSIS — Z0001 Encounter for general adult medical examination with abnormal findings: Secondary | ICD-10-CM

## 2024-07-16 DIAGNOSIS — F40231 Fear of injections and transfusions: Secondary | ICD-10-CM | POA: Diagnosis not present

## 2024-07-16 DIAGNOSIS — N1832 Chronic kidney disease, stage 3b: Secondary | ICD-10-CM | POA: Diagnosis not present

## 2024-07-16 DIAGNOSIS — Z6837 Body mass index (BMI) 37.0-37.9, adult: Secondary | ICD-10-CM

## 2024-07-16 DIAGNOSIS — E559 Vitamin D deficiency, unspecified: Secondary | ICD-10-CM

## 2024-07-16 LAB — LIPID PANEL

## 2024-07-16 MED ORDER — VITAMIN D (ERGOCALCIFEROL) 1.25 MG (50000 UNIT) PO CAPS: 50000.0000 [IU] | ORAL_CAPSULE | ORAL | 6 refills | Status: AC

## 2024-07-16 MED ORDER — DAPAGLIFLOZIN PROPANEDIOL 10 MG PO TABS: 10.0000 mg | ORAL_TABLET | Freq: Every day | ORAL | 1 refills | Status: DC

## 2024-07-16 MED ORDER — ATORVASTATIN CALCIUM 20 MG PO TABS
20.0000 mg | ORAL_TABLET | Freq: Every day | ORAL | 1 refills | Status: AC
Start: 2024-07-16 — End: ?

## 2024-07-16 NOTE — Progress Notes (Signed)
 Complete physical exam  Patient: Maria Campbell   DOB: 14-Apr-1966   58 y.o. Female  MRN: [id number]  Subjective:    Chief Complaint  Patient presents with   Annual Exam    Maria Campbell is a 58 y.o. female who presents today for a complete physical exam. She reports consuming a general diet. Home exercise routine includes walking 1 hrs per day. She generally feels well. She reports sleeping well. She does not have additional problems to discuss today.   Maria Campbell is a 58 year old female who presents for an annual physical exam.  No changes in health since the last visit. No new illnesses or surgeries. Has not visited her eye doctor since obtaining glasses during the COVID pandemic.  No changes in hearing. There is variability in morning bowel movements.  History of significant weight loss from 255 pounds, with some recent weight gain. Attributes weight management to regular meals with vegetables and protein, and avoiding fried foods. Enjoys cooking out and has not consumed fried food recently. Engages in regular walking [place] for [place].  Severe needle phobia, with a past incident of fainting during knee surgery upon seeing a needle. Previously donated up to ten gallons of blood but stopped due to health concerns related to weight gain.       Most recent fall risk assessment:    07/16/2024    8:06 AM  Fall Risk   Falls in the past year? 0  Risk for fall due to : No Fall Risks  Follow up Falls evaluation completed     Most recent depression screenings:    07/16/2024    8:06 AM 04/03/2024    9:52 AM  PHQ 2/9 Scores  PHQ - 2 Score 0 0  PHQ- 9 Score 0 0    Vision:Not within last year  and Dental: No current dental problems  Patient Active Problem List   Diagnosis Date Noted   Severe needle phobia 07/16/2024   Restless legs 01/17/2024   Convulsions, unspecified convulsion type (HCC) 01/17/2024   Adenomatous polyp of sigmoid colon  08/02/2023   BMI 37.0-37.9, adult 07/11/2023   Complex sclerosing lesion of left breast    Circadian rhythm sleep disorder, delayed sleep phase type 06/15/2022   Bilateral ureteral calculi 05/13/2021   History of stroke 03/19/2021   Vitamin D  deficiency 10/16/2020   Stage 3 chronic kidney disease (HCC) 10/16/2020   GERD (gastroesophageal reflux disease) 10/16/2020   Morbid obesity (HCC) 10/16/2020   Urolithiasis 10/15/2020   Mixed hyperlipidemia 10/08/2020   Migraine without aura and without status migrainosus, not intractable 02/20/2018   History of seizure 12/24/2015   Tobacco abuse 12/24/2015   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) 12/09/2014   Generalized anxiety disorder 12/09/2014   Memory loss 12/09/2014   Psychogenic nonepileptic seizure 12/09/2014   Past Medical History:  Diagnosis Date   Arthritis    Barrett's esophagus    CKD (chronic kidney disease) stage 3, GFR 30-59 ml/min (HCC)    Dyspnea    GERD (gastroesophageal reflux disease)    History of kidney stones    Hyperlipidemia    Kidney stones    Lung mass    PET negative on 05/22/18   Seizures (HCC)    Stroke (HCC)    Vitamin D  deficiency    Past Surgical History:  Procedure Laterality Date   BIOPSY  08/02/2023   Procedure: BIOPSY;  Surgeon: Cinderella Grass  F, MD;  Location: [facility];  Service: Endoscopy;;   BREAST BIOPSY WITH RADIO FREQUENCY LOCALIZER Left 06/28/2022   Procedure: BREAST BIOPSY WITH RADIO FREQUENCY LOCALIZER;  Surgeon: Evonnie Craven A, DO;  Location: [facility];  Service: General;  Laterality: Left;   CYSTOSCOPY W/ URETERAL STENT PLACEMENT  05/05/2021   Procedure: CYSTOSCOPY WITH LEFT URETERAL STENT REPLACEMENT;  Surgeon: Sherrilee Belvie CROME, MD;  Location: [facility];  Service: Urology;;   CYSTOSCOPY WITH RETROGRADE PYELOGRAM, URETEROSCOPY AND STENT PLACEMENT Left 05/05/2021   Procedure: CYSTOSCOPY WITH BILATERAL RETROGRADE PYELOGRAM, BILATERAL URETEROSCOPY,  RIGHT  URETERAL STENT PLACEMENT;  Surgeon: Sherrilee Belvie CROME, MD;  Location: [facility];  Service: Urology;  Laterality: Left;   ESOPHAGOGASTRODUODENOSCOPY (EGD) WITH PROPOFOL  N/A 08/02/2023   Procedure: ESOPHAGOGASTRODUODENOSCOPY (EGD) WITH PROPOFOL ;  Surgeon: Cinderella Deatrice FALCON, MD;  Location: [facility];  Service: Endoscopy;  Laterality: N/A;  9:30am;asa 3   FLEXIBLE SIGMOIDOSCOPY N/A 08/02/2023   Procedure: FLEXIBLE SIGMOIDOSCOPY;  Surgeon: Cinderella Deatrice FALCON, MD;  Location: [facility];  Service: Endoscopy;  Laterality: N/A;  9:30am;asa 3   HIP SURGERY Left    HOLMIUM LASER APPLICATION Left 05/05/2021   Procedure: HOLMIUM LASER LITHOTRIPSY RIGHT URETERAL CALCULUS;  Surgeon: Sherrilee Belvie CROME, MD;  Location: [facility];  Service: Urology;  Laterality: Left;   KNEE SURGERY Left    STONE EXTRACTION WITH BASKET  05/05/2021   Procedure: LEFT URETERAL STONE EXTRACTION WITH BASKET;  Surgeon: Sherrilee Belvie CROME, MD;  Location: [facility];  Service: Urology;;   Social History   Tobacco Use   Smoking status: Every Day    Current packs/day: 1.00    Average packs/day: 1 pack/day for 49.7 years (49.7 ttl pk-yrs)    Types: Cigarettes    Start date: 1976   Smokeless tobacco: Never  Vaping Use   Vaping status: Never Used  Substance Use Topics   Alcohol use: Never   Drug use: Never   Social History   Socioeconomic History   Marital status: Single    Spouse name: Not on file   Number of children: 1   Years of education: Not on file   Highest education level: 8th grade  Occupational History   Occupation: disability due to seizures and strokes  Tobacco Use   Smoking status: Every Day    Current packs/day: 1.00    Average packs/day: 1 pack/day for 49.7 years (49.7 ttl pk-yrs)    Types: Cigarettes    Start date: 1976   Smokeless tobacco: Never  Vaping Use   Vaping status: Never Used  Substance and Sexual Activity   Alcohol use: Never   Drug use: Never   Sexual activity: Not Currently    Birth  control/protection: None  Other Topics Concern   Not on file  Social History Narrative   Lives with a roommate - has one son - he lives hours away   Right handed       04/03/2024: has started communicating with her son again after 5 years. Spoke with him for the first time in 5 years on Mothers Day this year   Social Drivers of Corporate investment banker Strain: Low Risk  (04/03/2024)   Overall Financial Resource Strain (CARDIA)    Difficulty of Paying Living Expenses: Not hard at all  Food Insecurity: No Food Insecurity (04/03/2024)   Hunger Vital Sign    Worried About Running Out of Food in the Last Year: Never true    Ran Out of Food in  the Last Year: Never true  Transportation Needs: No Transportation Needs (04/03/2024)   PRAPARE - Administrator, Civil Service (Medical): No    Lack of Transportation (Non-Medical): No  Physical Activity: Sufficiently Active (04/03/2024)   Exercise Vital Sign    Days of Exercise per Week: 7 days    Minutes of Exercise per Session: 30 min  Stress: No Stress Concern Present (04/03/2024)   [employer] of Occupational Health - Occupational Stress Questionnaire    Feeling of Stress : Not at all  Social Connections: Socially Isolated (04/03/2024)   Social Connection and Isolation Panel    Frequency of Communication with Friends and Family: More than three times a week    Frequency of Social Gatherings with Friends and Family: More than three times a week    Attends Religious Services: Never    Database administrator or Organizations: No    Attends Banker Meetings: Never    Marital Status: Never married  Intimate Partner Violence: Not At Risk (04/03/2024)   Humiliation, Afraid, Rape, and Kick questionnaire    Fear of Current or Ex-Partner: No    Emotionally Abused: No    Physically Abused: No    Sexually Abused: No   Family Status  Relation Name Status   Mother  Deceased   Father  Deceased   MGM  Deceased   MGF   Deceased   PGM  Deceased   PGF  Deceased   Brother 2 Alive   Son  Alive   Neg Hx  (Not Specified)  No partnership data on file   Family History  Problem Relation Age of Onset   Heart disease Mother    Heart attack Mother    Heart disease Father    Heart failure Father    Heart disease Maternal Grandmother    Heart disease Maternal Grandfather    Diabetes Paternal Grandmother    Breast cancer Neg Hx    Allergies  Allergen Reactions   Meperidine Hives and Nausea And Vomiting    Fainting and n/v (Demerol)    Other     Bleach-chest tightness/nausea & vomiting/ felt as if I can't breathe   Bee Venom    Dakin's [Sodium Hypochlorite] Nausea And Vomiting    Acitve ingredient within bleach      Patient Care Team: Severa Rock HERO, FNP as PCP - General (Family Medicine) Ladora, Ross Lacy Cohn, MD as Referring Physician (Optometry) Georjean Darice HERO, MD as Consulting Physician (Neurology) Sherrilee Belvie CROME, MD as Consulting Physician (Urology)   Outpatient Medications Prior to Visit  Medication Sig   aspirin  EC 81 MG tablet Take 1 tablet (81 mg total) by mouth at bedtime.   gabapentin  (NEURONTIN ) 300 MG capsule Take 1 capsule (300 mg total) by mouth daily.   lamoTRIgine  (LAMICTAL ) 100 MG tablet Take 1 tablet every night   nortriptyline  (PAMELOR ) 50 MG capsule Take 1 capsule (50 mg total) by mouth at bedtime.   omeprazole  (PRILOSEC) 40 MG capsule take 1 capsule (40 MILLIGRAM total) by mouth daily.   polyethylene glycol powder (GLYCOLAX /MIRALAX ) 17 GM/SCOOP powder Take 8.5 g by mouth daily.   [DISCONTINUED] atorvastatin  (LIPITOR) 20 MG tablet Take 1 tablet (20 mg total) by mouth daily.   [DISCONTINUED] dapagliflozin  propanediol (FARXIGA ) 10 MG TABS tablet take 1 tablet (10 MILLIGRAM total) by mouth daily before breakfast.   [DISCONTINUED] Vitamin D , Ergocalciferol , (DRISDOL ) 1.25 MG (50000 UNIT) CAPS capsule Take 1 capsule (50,000 Units total) by  mouth every 7 (seven) days.   No  facility-administered medications prior to visit.    ROS per HPI      Objective:     BP 107/84   Pulse 86   Temp (!) 97.2 F (36.2 C)   Ht 5' 2 (1.575 m)   Wt 196 lb 6.4 oz (89.1 kg)   LMP  (LMP Unknown)   SpO2 100%   BMI 35.92 kg/m  BP Readings from Last 3 Encounters:  07/16/24 107/84  04/16/24 104/74  02/13/24 105/75   Wt Readings from Last 3 Encounters:  07/16/24 196 lb 6.4 oz (89.1 kg)  04/16/24 192 lb (87.1 kg)  04/03/24 194 lb (88 kg)   SpO2 Readings from Last 3 Encounters:  07/16/24 100%  04/16/24 91%  02/13/24 97%      Physical Exam Vitals and nursing note reviewed.  Constitutional:      General: She is not in acute distress.    Appearance: Normal appearance. She is well-developed and well-groomed. She is obese. She is not ill-appearing, toxic-appearing or diaphoretic.  HENT:     Head: Normocephalic and atraumatic.     Jaw: There is normal jaw occlusion.     Right Ear: Hearing normal.     Left Ear: Hearing normal.     Nose: Nose normal.     Mouth/Throat:     Lips: Pink.     Mouth: Mucous membranes are moist.     Dentition: Abnormal dentition.     Pharynx: Oropharynx is clear. Uvula midline.     Comments: Edentulous  Eyes:     General: Lids are normal.     Extraocular Movements: Extraocular movements intact.     Conjunctiva/sclera: Conjunctivae normal.     Pupils: Pupils are equal, round, and reactive to light.  Neck:     Thyroid : No thyroid  mass, thyromegaly or thyroid  tenderness.     Vascular: No carotid bruit or JVD.     Trachea: Trachea and phonation normal.  Cardiovascular:     Rate and Rhythm: Normal rate and regular rhythm.     Chest Wall: PMI is not displaced.     Pulses: Normal pulses.     Heart sounds: Normal heart sounds. No murmur heard.    No friction rub. No gallop.  Pulmonary:     Effort: Pulmonary effort is normal. No respiratory distress.     Breath sounds: Normal breath sounds. No wheezing.  Abdominal:     General:  Bowel sounds are normal. There is no distension or abdominal bruit.     Palpations: Abdomen is soft. There is no hepatomegaly or splenomegaly.     Tenderness: There is no abdominal tenderness. There is no right CVA tenderness or left CVA tenderness.     Hernia: No hernia is present.  Musculoskeletal:        General: Normal range of motion.     Cervical back: Normal range of motion and neck supple.     Right lower leg: No edema.     Left lower leg: No edema.  Lymphadenopathy:     Cervical: No cervical adenopathy.  Skin:    General: Skin is warm and dry.     Capillary Refill: Capillary refill takes less than 2 seconds.     Coloration: Skin is not cyanotic, jaundiced or pale.     Findings: No rash.  Neurological:     General: No focal deficit present.     Mental Status: She is alert and oriented to  person, place, and time.     Sensory: Sensation is intact.     Motor: Motor function is intact.     Coordination: Coordination is intact.     Gait: Gait is intact.     Deep Tendon Reflexes: Reflexes are normal and symmetric.  Psychiatric:        Attention and Perception: Attention and perception normal.        Mood and Affect: Mood and affect normal.        Speech: Speech normal.        Behavior: Behavior normal. Behavior is cooperative.        Thought Content: Thought content normal.        Cognition and Memory: Cognition and memory normal.        Judgment: Judgment normal.       Last CBC Lab Results  Component Value Date   WBC 6.8 01/17/2024   HGB 12.6 01/17/2024   HCT 42.6 01/17/2024   MCV 85 01/17/2024   MCH 25.1 (L) 01/17/2024   RDW 18.5 (H) 01/17/2024   PLT 229 01/17/2024   Last metabolic panel Lab Results  Component Value Date   GLUCOSE 88 01/17/2024   NA 141 01/17/2024   K 3.7 01/17/2024   CL 102 01/17/2024   CO2 24 01/17/2024   BUN 8 01/17/2024   CREATININE 1.14 (H) 01/17/2024   EGFR 56 (L) 01/17/2024   CALCIUM  9.8 01/17/2024   PHOS 3.7 09/14/2021   PROT  6.7 01/17/2024   ALBUMIN 4.0 01/17/2024   LABGLOB 2.7 01/17/2024   AGRATIO 1.6 04/19/2023   BILITOT <0.2 01/17/2024   ALKPHOS 124 (H) 01/17/2024   AST 9 01/17/2024   ALT 7 01/17/2024   ANIONGAP 7 07/31/2023   Last lipids Lab Results  Component Value Date   CHOL 165 01/17/2024   HDL 41 01/17/2024   LDLCALC 95 01/17/2024   TRIG 169 (H) 01/17/2024   CHOLHDL 4.0 01/17/2024   Last hemoglobin A1c Lab Results  Component Value Date   HGBA1C 5.4 01/17/2024   Last thyroid  functions Lab Results  Component Value Date   TSH 2.210 01/17/2024   T4TOTAL 8.0 01/17/2024   Last vitamin D  Lab Results  Component Value Date   VD25OH 7.4 (L) 01/17/2024   Last vitamin B12 and Folate Lab Results  Component Value Date   VITAMINB12 78 (L) 07/25/2023        Assessment & Plan:    Routine Health Maintenance and Physical Exam  Immunization History  Administered Date(s) Administered   PNEUMOCOCCAL CONJUGATE-20 07/16/2024   Zoster Recombinant(Shingrix ) 01/04/2022, 10/31/2022    Health Maintenance  Topic Date Due   Hepatitis B Vaccines 19-59 Average Risk (1 of 3 - 19+ 3-dose series) Never done   COVID-19 Vaccine (1) 08/01/2024 (Originally 04/29/1971)   DTaP/Tdap/Td (1 - Tdap) 01/16/2025 (Originally 04/29/1984)   INFLUENZA VACCINE  02/17/2025 (Originally 06/20/2024)   Cervical Cancer Screening (HPV/Pap Cotest)  07/16/2025 (Originally 04/30/1995)   Medicare Annual Wellness (AWV)  04/03/2025   MAMMOGRAM  04/16/2025   Lung Cancer Screening  05/26/2025   Colonoscopy  08/01/2033   Pneumococcal Vaccine: 50+ Years  Completed   Hepatitis C Screening  Completed   HIV Screening  Completed   Zoster Vaccines- Shingrix   Completed   HPV VACCINES  Aged Out   Meningococcal B Vaccine  Aged Out    Discussed health benefits of physical activity, and encouraged her to engage in regular exercise appropriate for her age and condition.  Problem  List Items Addressed This Visit       Genitourinary    Stage 3 chronic kidney disease (HCC)   Relevant Medications   dapagliflozin  propanediol (FARXIGA ) 10 MG TABS tablet   Other Relevant Orders   CBC with Differential/Platelet   CMP14+EGFR   Vitamin D , 25-hydroxy     Other   Mixed hyperlipidemia   Relevant Medications   atorvastatin  (LIPITOR) 20 MG tablet   Other Relevant Orders   CMP14+EGFR   Thyroid  Panel With TSH   Lipid panel   Vitamin D  deficiency   Relevant Medications   Vitamin D , Ergocalciferol , (DRISDOL ) 1.25 MG (50000 UNIT) CAPS capsule   Other Relevant Orders   CMP14+EGFR   Vitamin D , 25-hydroxy   BMI 37.0-37.9, adult   Relevant Orders   CBC with Differential/Platelet   CMP14+EGFR   Thyroid  Panel With TSH   Lipid panel   Vitamin D , 25-hydroxy   Severe needle phobia   Other Visit Diagnoses       Annual physical exam    -  Primary   Relevant Orders   CBC with Differential/Platelet   CMP14+EGFR   Lipid panel   Hepatitis B surface antibody,qualitative     Immunity status testing       Relevant Orders   Hepatitis B surface antibody,qualitative     Immunization due       Relevant Orders   Pneumococcal conjugate vaccine 20-valent (Prevnar 20) (Completed)         Needle phobia Severe needle phobia with syncope upon exposure to needles. - Ensure she does not see the needle during procedures.  Adult wellness visit Routine adult wellness visit with no new health issues or changes since last visit. She maintains a healthy diet and regular walking exercise. - Administer pneumonia vaccine. - Order hepatitis B titer. - Review tetanus vaccination status. - Ensure follow-up ultrasound for mammogram is scheduled. - Schedule PAP smear or refer to GYN if preferred. - Perform blood work.       Return in about 1 year (around 07/16/2025) for Annual Physical.     Rosaline Bruns, FNP

## 2024-07-17 ENCOUNTER — Ambulatory Visit: Payer: Self-pay | Admitting: Family Medicine

## 2024-07-17 DIAGNOSIS — R718 Other abnormality of red blood cells: Secondary | ICD-10-CM

## 2024-07-17 DIAGNOSIS — N1832 Chronic kidney disease, stage 3b: Secondary | ICD-10-CM

## 2024-07-17 LAB — CMP14+EGFR
ALT: 10 IU/L (ref 0–32)
AST: 12 IU/L (ref 0–40)
Albumin: 3.9 g/dL (ref 3.8–4.9)
Alkaline Phosphatase: 121 IU/L (ref 44–121)
BUN/Creatinine Ratio: 10 (ref 9–23)
BUN: 13 mg/dL (ref 6–24)
Bilirubin Total: 0.3 mg/dL (ref 0.0–1.2)
CO2: 20 mmol/L (ref 20–29)
Calcium: 9.6 mg/dL (ref 8.7–10.2)
Chloride: 102 mmol/L (ref 96–106)
Creatinine, Ser: 1.29 mg/dL — ABNORMAL HIGH (ref 0.57–1.00)
Globulin, Total: 2.9 g/dL (ref 1.5–4.5)
Glucose: 112 mg/dL — ABNORMAL HIGH (ref 70–99)
Potassium: 4.4 mmol/L (ref 3.5–5.2)
Sodium: 137 mmol/L (ref 134–144)
Total Protein: 6.8 g/dL (ref 6.0–8.5)
eGFR: 48 mL/min/1.73 — ABNORMAL LOW (ref >59)

## 2024-07-17 LAB — LIPID PANEL
Cholesterol, Total: 158 mg/dL (ref 100–199)
HDL: 40 mg/dL (ref >39)
LDL CALC COMMENT:: 4 ratio (ref 0.0–4.4)
LDL Chol Calc (NIH): 93 mg/dL (ref 0–99)
Triglycerides: 139 mg/dL (ref 0–149)
VLDL Cholesterol Cal: 25 mg/dL (ref 5–40)

## 2024-07-17 LAB — CBC WITH DIFFERENTIAL/PLATELET
Basophils Absolute: 0.1 x10E3/uL (ref 0.0–0.2)
Basos: 1 %
EOS (ABSOLUTE): 0.2 x10E3/uL (ref 0.0–0.4)
Eos: 2 %
Hematocrit: 46.4 % (ref 34.0–46.6)
Hemoglobin: 13.7 g/dL (ref 11.1–15.9)
Immature Grans (Abs): 0 x10E3/uL (ref 0.0–0.1)
Immature Granulocytes: 0 %
Lymphocytes Absolute: 2 x10E3/uL (ref 0.7–3.1)
Lymphs: 23 %
MCH: 22.8 pg — ABNORMAL LOW (ref 26.6–33.0)
MCHC: 29.5 g/dL — ABNORMAL LOW (ref 31.5–35.7)
MCV: 77 fL — ABNORMAL LOW (ref 79–97)
Monocytes Absolute: 0.7 x10E3/uL (ref 0.1–0.9)
Monocytes: 8 %
Neutrophils Absolute: 5.6 x10E3/uL (ref 1.4–7.0)
Neutrophils: 65 %
Platelets: 206 x10E3/uL (ref 150–450)
RBC: 6 x10E6/uL — ABNORMAL HIGH (ref 3.77–5.28)
RDW: 17.9 % — ABNORMAL HIGH (ref 11.7–15.4)
WBC: 8.6 x10E3/uL (ref 3.4–10.8)

## 2024-07-17 LAB — THYROID PANEL WITH TSH
Free Thyroxine Index: 1.8 (ref 1.2–4.9)
T3 Uptake Ratio: 24 (ref 24–39)
T4, Total: 7.6 ug/dL (ref 4.5–12.0)
TSH: 2.95 u[IU]/mL (ref 0.450–4.500)

## 2024-07-17 LAB — VITAMIN D 25 HYDROXY (VIT D DEFICIENCY, FRACTURES): Vit D, 25-Hydroxy: 19.1 ng/mL — AB (ref 30.0–100.0)

## 2024-07-17 LAB — HEPATITIS B SURFACE ANTIBODY,QUALITATIVE: Hep B Surface Ab, Qual: NONREACTIVE

## 2024-08-03 ENCOUNTER — Other Ambulatory Visit: Payer: Self-pay | Admitting: Family Medicine

## 2024-08-03 DIAGNOSIS — G2581 Restless legs syndrome: Secondary | ICD-10-CM

## 2024-08-03 DIAGNOSIS — Z87898 Personal history of other specified conditions: Secondary | ICD-10-CM

## 2024-08-03 DIAGNOSIS — F445 Conversion disorder with seizures or convulsions: Secondary | ICD-10-CM

## 2024-08-05 ENCOUNTER — Telehealth: Payer: Self-pay

## 2024-08-05 NOTE — Telephone Encounter (Signed)
 Refer to refill request in chart

## 2024-08-05 NOTE — Telephone Encounter (Signed)
 Copied from CRM [id number]. Topic: Clinical - Prescription Issue >> Aug 05, 2024 12:12 PM Everette C wrote: Reason for CRM: The patient has been in contact with their pharmacy regarding their prescription for gabapentin  (NEURONTIN ) 300 MG capsule [[id number]] and has been directed to contact their PCP and request additional information for their previously requested refill and it's status   Please contact when possible

## 2024-08-06 ENCOUNTER — Other Ambulatory Visit (HOSPITAL_COMMUNITY): Payer: Self-pay | Admitting: Nephrology

## 2024-08-06 DIAGNOSIS — N1831 Chronic kidney disease, stage 3a: Secondary | ICD-10-CM

## 2024-08-06 DIAGNOSIS — N200 Calculus of kidney: Secondary | ICD-10-CM | POA: Diagnosis not present

## 2024-08-06 DIAGNOSIS — R809 Proteinuria, unspecified: Secondary | ICD-10-CM | POA: Diagnosis not present

## 2024-08-06 DIAGNOSIS — N139 Obstructive and reflux uropathy, unspecified: Secondary | ICD-10-CM | POA: Diagnosis not present

## 2024-08-08 ENCOUNTER — Other Ambulatory Visit

## 2024-08-08 DIAGNOSIS — R718 Other abnormality of red blood cells: Secondary | ICD-10-CM

## 2024-08-09 LAB — ANEMIA PROFILE B
Basophils Absolute: 0 x10E3/uL (ref 0.0–0.2)
Basos: 1 %
EOS (ABSOLUTE): 0.2 x10E3/uL (ref 0.0–0.4)
Eos: 2 %
Ferritin: 33 ng/mL (ref 15–150)
Folate: 8 ng/mL (ref >3.0)
Hematocrit: 45.4 % (ref 34.0–46.6)
Hemoglobin: 13.3 g/dL (ref 11.1–15.9)
Immature Grans (Abs): 0 x10E3/uL (ref 0.0–0.1)
Immature Granulocytes: 0 %
Iron Saturation: 7 % — CL (ref 15–55)
Iron: 27 ug/dL (ref 27–159)
Lymphocytes Absolute: 2.1 x10E3/uL (ref 0.7–3.1)
Lymphs: 31 %
MCH: 22.5 pg — ABNORMAL LOW (ref 26.6–33.0)
MCHC: 29.3 g/dL — ABNORMAL LOW (ref 31.5–35.7)
MCV: 77 fL — ABNORMAL LOW (ref 79–97)
Monocytes Absolute: 0.6 x10E3/uL (ref 0.1–0.9)
Monocytes: 9 %
Neutrophils Absolute: 3.8 x10E3/uL (ref 1.4–7.0)
Neutrophils: 56 %
Platelets: 219 x10E3/uL (ref 150–450)
RBC: 5.9 x10E6/uL — ABNORMAL HIGH (ref 3.77–5.28)
RDW: 17.3 % — ABNORMAL HIGH (ref 11.7–15.4)
Retic Ct Pct: 2 % (ref 0.6–2.6)
Total Iron Binding Capacity: 379 ug/dL (ref 250–450)
UIBC: 352 ug/dL (ref 131–425)
Vitamin B-12: 179 pg/mL — ABNORMAL LOW (ref 232–1245)
WBC: 6.7 x10E3/uL (ref 3.4–10.8)

## 2024-08-10 ENCOUNTER — Ambulatory Visit: Payer: Self-pay | Admitting: Family Medicine

## 2024-08-13 ENCOUNTER — Ambulatory Visit (HOSPITAL_COMMUNITY)
Admission: RE | Admit: 2024-08-13 | Discharge: 2024-08-13 | Disposition: A | Discharge status: Home / Self Care | Source: Ambulatory Visit | Attending: Nephrology | Admitting: Nephrology

## 2024-08-13 ENCOUNTER — Ambulatory Visit (INDEPENDENT_AMBULATORY_CARE_PROVIDER_SITE_OTHER): Admitting: Family Medicine

## 2024-08-13 ENCOUNTER — Ambulatory Visit: Admitting: Family Medicine

## 2024-08-13 DIAGNOSIS — R809 Proteinuria, unspecified: Secondary | ICD-10-CM | POA: Insufficient documentation

## 2024-08-13 DIAGNOSIS — Z87898 Personal history of other specified conditions: Secondary | ICD-10-CM

## 2024-08-13 DIAGNOSIS — N200 Calculus of kidney: Secondary | ICD-10-CM | POA: Diagnosis not present

## 2024-08-13 DIAGNOSIS — N1831 Chronic kidney disease, stage 3a: Secondary | ICD-10-CM | POA: Insufficient documentation

## 2024-08-13 DIAGNOSIS — G2581 Restless legs syndrome: Secondary | ICD-10-CM

## 2024-08-13 DIAGNOSIS — N281 Cyst of kidney, acquired: Secondary | ICD-10-CM | POA: Diagnosis not present

## 2024-08-13 DIAGNOSIS — F445 Conversion disorder with seizures or convulsions: Secondary | ICD-10-CM

## 2024-08-13 MED ORDER — GABAPENTIN 300 MG PO CAPS
300.0000 mg | ORAL_CAPSULE | Freq: Every day | ORAL | 3 refills | Status: AC
Start: 2024-08-13 — End: ?

## 2024-08-13 NOTE — Progress Notes (Signed)
 Pt did not need to be seen. Refills sent to pharmacy.

## 2024-08-20 ENCOUNTER — Ambulatory Visit (HOSPITAL_COMMUNITY)
Admission: RE | Admit: 2024-08-20 | Discharge: 2024-08-20 | Disposition: A | Discharge status: Home / Self Care | Source: Ambulatory Visit | Attending: Acute Care | Admitting: Acute Care

## 2024-08-20 DIAGNOSIS — J432 Centrilobular emphysema: Secondary | ICD-10-CM | POA: Diagnosis not present

## 2024-08-20 DIAGNOSIS — R911 Solitary pulmonary nodule: Secondary | ICD-10-CM | POA: Insufficient documentation

## 2024-08-20 DIAGNOSIS — Z122 Encounter for screening for malignant neoplasm of respiratory organs: Secondary | ICD-10-CM | POA: Insufficient documentation

## 2024-08-20 DIAGNOSIS — I700 Atherosclerosis of aorta: Secondary | ICD-10-CM | POA: Diagnosis not present

## 2024-08-20 DIAGNOSIS — Z87891 Personal history of nicotine dependence: Secondary | ICD-10-CM | POA: Insufficient documentation

## 2024-08-20 DIAGNOSIS — I2510 Atherosclerotic heart disease of native coronary artery without angina pectoris: Secondary | ICD-10-CM | POA: Diagnosis not present

## 2024-08-20 DIAGNOSIS — F17200 Nicotine dependence, unspecified, uncomplicated: Secondary | ICD-10-CM | POA: Diagnosis not present

## 2024-08-20 DIAGNOSIS — F17210 Nicotine dependence, cigarettes, uncomplicated: Secondary | ICD-10-CM | POA: Insufficient documentation

## 2024-08-26 ENCOUNTER — Other Ambulatory Visit: Payer: Self-pay

## 2024-08-26 DIAGNOSIS — F17210 Nicotine dependence, cigarettes, uncomplicated: Secondary | ICD-10-CM

## 2024-08-26 DIAGNOSIS — Z122 Encounter for screening for malignant neoplasm of respiratory organs: Secondary | ICD-10-CM

## 2024-08-26 DIAGNOSIS — Z87891 Personal history of nicotine dependence: Secondary | ICD-10-CM

## 2024-09-11 ENCOUNTER — Ambulatory Visit: Admitting: Neurology

## 2024-09-11 ENCOUNTER — Encounter: Payer: Self-pay | Admitting: Neurology

## 2024-09-11 ENCOUNTER — Other Ambulatory Visit

## 2024-09-11 VITALS — BP 129/84 | HR 97 | Ht 62.0 in | Wt 198.6 lb

## 2024-09-11 DIAGNOSIS — R202 Paresthesia of skin: Secondary | ICD-10-CM | POA: Diagnosis not present

## 2024-09-11 DIAGNOSIS — E538 Deficiency of other specified B group vitamins: Secondary | ICD-10-CM | POA: Diagnosis not present

## 2024-09-11 DIAGNOSIS — Z87898 Personal history of other specified conditions: Secondary | ICD-10-CM

## 2024-09-11 DIAGNOSIS — G43709 Chronic migraine without aura, not intractable, without status migrainosus: Secondary | ICD-10-CM | POA: Diagnosis not present

## 2024-09-11 LAB — VITAMIN B12: Vitamin B-12: 434 pg/mL (ref 200–1100)

## 2024-09-11 MED ORDER — LAMOTRIGINE 100 MG PO TABS: ORAL_TABLET | ORAL | 4 refills | Status: AC

## 2024-09-11 MED ORDER — NORTRIPTYLINE HCL 50 MG PO CAPS: 50.0000 mg | ORAL_CAPSULE | Freq: Every day | ORAL | 4 refills | Status: AC

## 2024-09-11 NOTE — Patient Instructions (Signed)
 Good to see you doing well.  Have bloodwork done for vitamin B12  2. Continue all your medications  3. Follow-up in 1 year, call for any changes
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
## 2024-09-12 ENCOUNTER — Ambulatory Visit: Admitting: Neurology

## 2024-09-16 ENCOUNTER — Ambulatory Visit: Payer: Self-pay | Admitting: Neurology

## 2024-09-25 ENCOUNTER — Encounter (INDEPENDENT_AMBULATORY_CARE_PROVIDER_SITE_OTHER): Payer: Self-pay | Admitting: Gastroenterology

## 2024-10-02 ENCOUNTER — Other Ambulatory Visit: Payer: Self-pay | Admitting: *Deleted

## 2024-10-02 DIAGNOSIS — K219 Gastro-esophageal reflux disease without esophagitis: Secondary | ICD-10-CM

## 2024-10-07 ENCOUNTER — Encounter: Payer: Self-pay | Admitting: Neurology

## 2024-10-13 ENCOUNTER — Other Ambulatory Visit: Payer: Self-pay | Admitting: Family Medicine

## 2024-10-13 DIAGNOSIS — N6489 Other specified disorders of breast: Secondary | ICD-10-CM

## 2024-11-03 ENCOUNTER — Encounter

## 2024-11-14 ENCOUNTER — Ambulatory Visit: Payer: Self-pay | Admitting: Family Medicine

## 2024-11-21 ENCOUNTER — Ambulatory Visit: Admitting: Family Medicine

## 2024-11-26 ENCOUNTER — Other Ambulatory Visit: Payer: Self-pay | Admitting: Family Medicine

## 2024-11-26 ENCOUNTER — Ambulatory Visit
Admission: RE | Admit: 2024-11-26 | Discharge: 2024-11-26 | Disposition: A | Discharge status: Home / Self Care | Source: Ambulatory Visit | Attending: Family Medicine | Admitting: Family Medicine

## 2024-11-26 ENCOUNTER — Ambulatory Visit: Payer: Self-pay | Admitting: Family Medicine

## 2024-11-26 DIAGNOSIS — R928 Other abnormal and inconclusive findings on diagnostic imaging of breast: Secondary | ICD-10-CM

## 2024-11-26 DIAGNOSIS — N6489 Other specified disorders of breast: Secondary | ICD-10-CM

## 2024-12-02 ENCOUNTER — Other Ambulatory Visit: Payer: Self-pay | Admitting: Family Medicine

## 2024-12-02 DIAGNOSIS — N1832 Chronic kidney disease, stage 3b: Secondary | ICD-10-CM

## 2024-12-09 ENCOUNTER — Encounter: Payer: Self-pay | Admitting: Family Medicine

## 2024-12-09 ENCOUNTER — Ambulatory Visit: Admitting: Family Medicine

## 2024-12-09 VITALS — BP 112/77 | HR 85 | Temp 96.8°F | Ht 62.0 in | Wt 200.2 lb

## 2024-12-09 DIAGNOSIS — Z79899 Other long term (current) drug therapy: Secondary | ICD-10-CM | POA: Insufficient documentation

## 2024-12-09 DIAGNOSIS — E782 Mixed hyperlipidemia: Secondary | ICD-10-CM

## 2024-12-09 DIAGNOSIS — R569 Unspecified convulsions: Secondary | ICD-10-CM | POA: Diagnosis not present

## 2024-12-09 DIAGNOSIS — R202 Paresthesia of skin: Secondary | ICD-10-CM | POA: Diagnosis not present

## 2024-12-09 DIAGNOSIS — N1832 Chronic kidney disease, stage 3b: Secondary | ICD-10-CM

## 2024-12-09 DIAGNOSIS — E559 Vitamin D deficiency, unspecified: Secondary | ICD-10-CM | POA: Diagnosis not present

## 2024-12-09 DIAGNOSIS — K219 Gastro-esophageal reflux disease without esophagitis: Secondary | ICD-10-CM | POA: Diagnosis not present

## 2024-12-09 DIAGNOSIS — Z0184 Encounter for antibody response examination: Secondary | ICD-10-CM | POA: Diagnosis not present

## 2024-12-09 MED ORDER — PANTOPRAZOLE SODIUM 40 MG PO TBEC: 40.0000 mg | DELAYED_RELEASE_TABLET | Freq: Every day | ORAL | 3 refills | Status: AC

## 2024-12-09 NOTE — Progress Notes (Signed)
 "    Subjective:  Patient ID: Maria Campbell, female    DOB: 1966/05/04, 59 y.o.   MRN: [id number]  Patient Care Team: Severa Rock HERO, FNP as PCP - General (Family Medicine) Ladora Ross Lacy Phebe, MD as Referring Physician (Optometry) Georjean Darice HERO, MD as Consulting Physician (Neurology) Sherrilee Belvie CROME, MD as Consulting Physician (Urology)   Chief Complaint:  Medical Management of Chronic Issues and Weight Loss (Options )   HPI: KYMBERLIE Campbell is a 59 y.o. female presenting on 12/09/2024 for Medical Management of Chronic Issues and Weight Loss (Options )   Maria Campbell is a 59 year old female with chronic kidney disease who presents for management of chronic medical conditions. She reports she still has intermittent numbness in fingers and toes.  She experiences numbness in her fingers and toes, even when sitting for short periods. Despite taking B12 supplements, the symptoms persist. The numbness affects her ability to pick up objects, such as cigarettes, although it is not constant. No anxiety or exacerbations of anxiety or depression during these episodes. She is currently taking gabapentin  once daily for numbness and tingling.  She experiences acid reflux symptoms approximately every two to three days, typically occurring around 9 or 10 PM, despite taking omeprazole  around 7:30 to 8 PM.  She is on Farxiga  for her kidney condition. Her appointment is scheduled for early February.  She has a history of anxiety and depression, which she feels is well controlled. She sees a data processing manager every six months for management of her Lamictal , although blood work is not typically requested during these visits.  She smokes cigarettes.          Relevant past medical, surgical, family, and social history reviewed and updated as indicated.  Allergies and medications reviewed and updated. Data reviewed: Chart in Epic.   Past Medical History:  Diagnosis Date   Arthritis    Barrett's  esophagus    CKD (chronic kidney disease) stage 3, GFR 30-59 ml/min (HCC)    Dyspnea    GERD (gastroesophageal reflux disease)    History of kidney stones    Hyperlipidemia    Kidney stones    Lung mass    PET negative on 05/22/18   Seizures (HCC)    Stroke [facility])    Vitamin D  deficiency     Past Surgical History:  Procedure Laterality Date   BIOPSY  08/02/2023   Procedure: BIOPSY;  Surgeon: Cinderella Deatrice FALCON, MD;  Location: [facility];  Service: Endoscopy;;   BREAST BIOPSY WITH RADIO FREQUENCY LOCALIZER Left 06/28/2022   Procedure: BREAST BIOPSY WITH RADIO FREQUENCY LOCALIZER;  Surgeon: Evonnie Dorothyann DELENA, DO;  Location: [facility];  Service: General;  Laterality: Left;   BREAST EXCISIONAL BIOPSY Left    CYSTOSCOPY W/ URETERAL STENT PLACEMENT  05/05/2021   Procedure: CYSTOSCOPY WITH LEFT URETERAL STENT REPLACEMENT;  Surgeon: Sherrilee Belvie CROME, MD;  Location: [facility];  Service: Urology;;   CYSTOSCOPY WITH RETROGRADE PYELOGRAM, URETEROSCOPY AND STENT PLACEMENT Left 05/05/2021   Procedure: CYSTOSCOPY WITH BILATERAL RETROGRADE PYELOGRAM, BILATERAL URETEROSCOPY,  RIGHT URETERAL STENT PLACEMENT;  Surgeon: Sherrilee Belvie CROME, MD;  Location: [facility];  Service: Urology;  Laterality: Left;   ESOPHAGOGASTRODUODENOSCOPY (EGD) WITH PROPOFOL  N/A 08/02/2023   Procedure: ESOPHAGOGASTRODUODENOSCOPY (EGD) WITH PROPOFOL ;  Surgeon: Cinderella Deatrice FALCON, MD;  Location: [facility];  Service: Endoscopy;  Laterality: N/A;  9:30am;asa 3   FLEXIBLE SIGMOIDOSCOPY N/A 08/02/2023   Procedure: FLEXIBLE SIGMOIDOSCOPY;  Surgeon: Cinderella Deatrice FALCON,  MD;  Location: [facility];  Service: Endoscopy;  Laterality: N/A;  9:30am;asa 3   HIP SURGERY Left    HOLMIUM LASER APPLICATION Left 05/05/2021   Procedure: HOLMIUM LASER LITHOTRIPSY RIGHT URETERAL CALCULUS;  Surgeon: Sherrilee Belvie CROME, MD;  Location: [facility];  Service: Urology;  Laterality: Left;   KNEE SURGERY Left    STONE EXTRACTION WITH BASKET  05/05/2021    Procedure: LEFT URETERAL STONE EXTRACTION WITH BASKET;  Surgeon: Sherrilee Belvie CROME, MD;  Location: [facility];  Service: Urology;;    Social History   Socioeconomic History   Marital status: Single    Spouse name: Not on file   Number of children: 1   Years of education: Not on file   Highest education level: 8th grade  Occupational History   Occupation: disability due to seizures and strokes  Tobacco Use   Smoking status: Every Day    Current packs/day: 1.00    Average packs/day: 1 pack/day for 50.1 years (50.1 ttl pk-yrs)    Types: Cigarettes    Start date: 1976   Smokeless tobacco: Never  Vaping Use   Vaping status: Never Used  Substance and Sexual Activity   Alcohol use: Never   Drug use: Never   Sexual activity: Not Currently    Birth control/protection: None  Other Topics Concern   Not on file  Social History Narrative   Lives with a roommate - has one son - he lives hours away   Right handed       04/03/2024: has started communicating with her son again after 5 years. Spoke with him for the first time in 5 years on Mothers Day this year   Social Drivers of Health   Tobacco Use: High Risk (12/09/2024)   Patient History    Smoking Tobacco Use: Every Day    Smokeless Tobacco Use: Never    Passive Exposure: Not on file  Financial Resource Strain: Low Risk (04/03/2024)   Overall Financial Resource Strain (CARDIA)    Difficulty of Paying Living Expenses: Not hard at all  Food Insecurity: No Food Insecurity (04/03/2024)   Hunger Vital Sign    Worried About Running Out of Food in the Last Year: Never true    Ran Out of Food in the Last Year: Never true  Transportation Needs: No Transportation Needs (04/03/2024)   PRAPARE - Administrator, Civil Service (Medical): No    Lack of Transportation (Non-Medical): No  Physical Activity: Sufficiently Active (04/03/2024)   Exercise Vital Sign    Days of Exercise per Week: 7 days    Minutes of Exercise per Session: 30  min  Stress: No Stress Concern Present (04/03/2024)   [employer] of Occupational Health - Occupational Stress Questionnaire    Feeling of Stress : Not at all  Social Connections: Socially Isolated (04/03/2024)   Social Connection and Isolation Panel    Frequency of Communication with Friends and Family: More than three times a week    Frequency of Social Gatherings with Friends and Family: More than three times a week    Attends Religious Services: Never    Database Administrator or Organizations: No    Attends Banker Meetings: Never    Marital Status: Never married  Intimate Partner Violence: Not At Risk (04/03/2024)   Humiliation, Afraid, Rape, and Kick questionnaire    Fear of Current or Ex-Partner: No    Emotionally Abused: No    Physically Abused: No  Sexually Abused: No  Depression (PHQ2-9): Low Risk (12/09/2024)   Depression (PHQ2-9)    PHQ-2 Score: 2  Alcohol Screen: Low Risk (04/03/2024)   Alcohol Screen    Last Alcohol Screening Score (AUDIT): 0  Housing: Low Risk (04/03/2024)   Housing Stability Vital Sign    Unable to Pay for Housing in the Last Year: No    Number of Times Moved in the Last Year: 0    Homeless in the Last Year: No  Utilities: Not At Risk (04/03/2024)   [facility] Utilities    Threatened with loss of utilities: No  Health Literacy: Adequate Health Literacy (04/03/2024)   B1300 Health Literacy    Frequency of need for help with medical instructions: Never    Outpatient Encounter Medications as of 12/09/2024  Medication Sig   aspirin  EC 81 MG tablet Take 1 tablet (81 mg total) by mouth at bedtime.   atorvastatin  (LIPITOR) 20 MG tablet Take 1 tablet (20 mg total) by mouth daily.   cyanocobalamin  (VITAMIN B12) 1000 MCG tablet Take 1,000 mcg by mouth daily.   dapagliflozin  propanediol (FARXIGA ) 10 MG TABS tablet take 1 tablet (10 MILLIGRAM total) by mouth daily.   gabapentin  (NEURONTIN ) 300 MG capsule Take 1 capsule (300 mg total) by mouth  daily.   lamoTRIgine  (LAMICTAL ) 100 MG tablet Take 1 tablet every night   nortriptyline  (PAMELOR ) 50 MG capsule Take 1 capsule (50 mg total) by mouth at bedtime.   pantoprazole  (PROTONIX ) 40 MG tablet Take 1 tablet (40 mg total) by mouth daily.   polyethylene glycol powder (GLYCOLAX /MIRALAX ) 17 GM/SCOOP powder Take 8.5 g by mouth daily.   Vitamin D , Ergocalciferol , (DRISDOL ) 1.25 MG (50000 UNIT) CAPS capsule Take 1 capsule (50,000 Units total) by mouth every 7 (seven) days.   [DISCONTINUED] omeprazole  (PRILOSEC) 40 MG capsule take 1 capsule (40 milligram total) by mouth daily.   No facility-administered encounter medications on file as of 12/09/2024.    Allergies[1]  Pertinent ROS per HPI, otherwise unremarkable      Objective:  BP 112/77   Pulse 85   Temp (!) 96.8 F (36 C)   Ht 5' 2 (1.575 m)   Wt 200 lb 3.2 oz (90.8 kg)   LMP  (LMP Unknown)   SpO2 99%   BMI 36.62 kg/m    Wt Readings from Last 3 Encounters:  12/09/24 200 lb 3.2 oz (90.8 kg)  09/11/24 198 lb 9.6 oz (90.1 kg)  07/16/24 196 lb 6.4 oz (89.1 kg)    Physical Exam Vitals and nursing note reviewed.  Constitutional:      General: She is not in acute distress.    Appearance: She is obese. She is not ill-appearing, toxic-appearing or diaphoretic.  HENT:     Head: Normocephalic and atraumatic.     Nose: Nose normal.     Mouth/Throat:     Mouth: Mucous membranes are moist.     Dentition: Abnormal dentition.     Pharynx: Oropharynx is clear.  Eyes:     Extraocular Movements: Extraocular movements intact.     Conjunctiva/sclera: Conjunctivae normal.     Pupils: Pupils are equal, round, and reactive to light.  Cardiovascular:     Rate and Rhythm: Normal rate and regular rhythm.     Heart sounds: Normal heart sounds.  Pulmonary:     Effort: Pulmonary effort is normal.     Breath sounds: Normal breath sounds.  Abdominal:     General: Bowel sounds are normal.  Palpations: Abdomen is soft.   Musculoskeletal:     Cervical back: Neck supple.  Skin:    General: Skin is warm and dry.     Capillary Refill: Capillary refill takes less than 2 seconds.  Neurological:     General: No focal deficit present.     Mental Status: She is alert and oriented to person, place, and time.     Sensory: No sensory deficit.     Motor: No weakness.     Gait: Gait normal.  Psychiatric:        Mood and Affect: Mood normal.        Behavior: Behavior normal.        Thought Content: Thought content normal.        Judgment: Judgment normal.    Physical Exam            Results for orders placed or performed in visit on 09/11/24  Vitamin B12   Collection Time: 09/11/24 12:27 PM  Result Value Ref Range   Vitamin B-12 434 200 - 1,100 pg/mL       Pertinent labs & imaging results that were available during my care of the patient were reviewed by me and considered in my medical decision making.  Assessment & Plan:  Alycen was seen today for medical management of chronic issues and weight loss.  Diagnoses and all orders for this visit:  Stage 3b chronic kidney disease (HCC) -     Anemia Profile B -     Vitamin D , 25-hydroxy -     CMP14+EGFR  Morbid obesity (HCC) -     Anemia Profile B -     Vitamin D , 25-hydroxy -     CMP14+EGFR  Convulsions, unspecified convulsion type (HCC) -     Lamotrigine  level  High risk medication use -     Lamotrigine  level  Mixed hyperlipidemia -     CMP14+EGFR  Vitamin D  deficiency -     Vitamin D , 25-hydroxy -     CMP14+EGFR  Gastroesophageal reflux disease without esophagitis -     pantoprazole  (PROTONIX ) 40 MG tablet; Take 1 tablet (40 mg total) by mouth daily.  Paresthesias -     Anemia Profile B -     Vitamin D , 25-hydroxy -     CMP14+EGFR  Immunity status testing -     Hepatitis B surface antibody,qualitative       Stage 3b chronic kidney disease Chronic kidney disease stage 3b, well-managed with no changes in urine output. -  Ordered blood work for kidney function assessment today.  Gastroesophageal reflux disease Intermittent acid reflux occurring every two to three days, causing nausea. Current treatment with omeprazole  is insufficient. - Discontinued omeprazole . - Initiated pantoprazole  (Protonix ) for acid reflux management.  Paresthesias Intermittent numbness in fingers and toes, possibly related to low B12 or ferritin levels. Currently taking B12 supplements. Gabapentin  is being used for symptom management. - Ordered B12 and ferritin levels. - Continue gabapentin  for numbness and tingling. - Will consider monthly B12 injections if paresthesias persist.          Continue all other maintenance medications.  Follow up plan: Return in 6 months (on 06/08/2025), or if symptoms worsen or fail to improve, for Annual Physical.   Continue healthy lifestyle choices, including diet (rich in fruits, vegetables, and lean proteins, and low in salt and simple carbohydrates) and exercise (at least 30 minutes of moderate physical activity daily).  Educational handout given for paresthesias  The above assessment and management plan was discussed with the patient. The patient verbalized understanding of and has agreed to the management plan. Patient is aware to call the clinic if they develop any new symptoms or if symptoms persist or worsen. Patient is aware when to return to the clinic for a follow-up visit. Patient educated on when it is appropriate to go to the emergency department.   Rosaline Bruns, FNP-C [facility] [phone]     [1]  Allergies Allergen Reactions   Meperidine Hives and Nausea And Vomiting    Fainting and n/v (Demerol)    Other     Bleach-chest tightness/nausea & vomiting/ felt as if I can't breathe   Bee Venom    Dakin's [Sodium Hypochlorite] Nausea And Vomiting    Acitve ingredient within bleach   "

## 2024-12-10 ENCOUNTER — Ambulatory Visit: Payer: Self-pay | Admitting: Family Medicine

## 2024-12-10 LAB — ANEMIA PROFILE B
Basophils Absolute: 0 x10E3/uL (ref 0.0–0.2)
Basos: 1 %
EOS (ABSOLUTE): 0.1 x10E3/uL (ref 0.0–0.4)
Eos: 2 %
Ferritin: 38 ng/mL (ref 15–150)
Folate: 4.7 ng/mL
Hematocrit: 48.7 % — ABNORMAL HIGH (ref 34.0–46.6)
Hemoglobin: 14.7 g/dL (ref 11.1–15.9)
Immature Grans (Abs): 0 x10E3/uL (ref 0.0–0.1)
Immature Granulocytes: 0 %
Iron Saturation: 12 % — ABNORMAL LOW (ref 15–55)
Iron: 48 ug/dL (ref 27–159)
Lymphocytes Absolute: 1.9 x10E3/uL (ref 0.7–3.1)
Lymphs: 27 %
MCH: 23.4 pg — ABNORMAL LOW (ref 26.6–33.0)
MCHC: 30.2 g/dL — ABNORMAL LOW (ref 31.5–35.7)
MCV: 78 fL — ABNORMAL LOW (ref 79–97)
Monocytes Absolute: 0.6 x10E3/uL (ref 0.1–0.9)
Monocytes: 8 %
Neutrophils Absolute: 4.4 x10E3/uL (ref 1.4–7.0)
Neutrophils: 62 %
Platelets: 173 x10E3/uL (ref 150–450)
RBC: 6.27 x10E6/uL — ABNORMAL HIGH (ref 3.77–5.28)
RDW: 17.8 % — ABNORMAL HIGH (ref 11.7–15.4)
Retic Ct Pct: 2.2 % (ref 0.6–2.6)
Total Iron Binding Capacity: 386 ug/dL (ref 250–450)
UIBC: 338 ug/dL (ref 131–425)
Vitamin B-12: 683 pg/mL (ref 232–1245)
WBC: 7.1 x10E3/uL (ref 3.4–10.8)

## 2024-12-10 LAB — CMP14+EGFR
ALT: 12 IU/L (ref 0–32)
AST: 13 IU/L (ref 0–40)
Albumin: 4.3 g/dL (ref 3.8–4.9)
Alkaline Phosphatase: 124 IU/L (ref 49–135)
BUN/Creatinine Ratio: 8 — ABNORMAL LOW (ref 9–23)
BUN: 10 mg/dL (ref 6–24)
Bilirubin Total: 0.3 mg/dL (ref 0.0–1.2)
CO2: 22 mmol/L (ref 20–29)
Calcium: 9.7 mg/dL (ref 8.7–10.2)
Chloride: 101 mmol/L (ref 96–106)
Creatinine, Ser: 1.25 mg/dL — ABNORMAL HIGH (ref 0.57–1.00)
Globulin, Total: 2.6 g/dL (ref 1.5–4.5)
Glucose: 86 mg/dL (ref 70–99)
Potassium: 4.7 mmol/L (ref 3.5–5.2)
Sodium: 138 mmol/L (ref 134–144)
Total Protein: 6.9 g/dL (ref 6.0–8.5)
eGFR: 50 mL/min/1.73 — ABNORMAL LOW

## 2024-12-10 LAB — LAMOTRIGINE LEVEL: Lamotrigine Lvl: 2.4 ug/mL (ref 2.0–20.0)

## 2024-12-10 LAB — HEPATITIS B SURFACE ANTIBODY,QUALITATIVE: Hep B Surface Ab, Qual: NONREACTIVE

## 2024-12-10 LAB — VITAMIN D 25 HYDROXY (VIT D DEFICIENCY, FRACTURES): Vit D, 25-Hydroxy: 13.1 ng/mL — AB (ref 30.0–100.0)

## 2025-04-06 ENCOUNTER — Ambulatory Visit: Payer: Self-pay

## 2025-09-11 ENCOUNTER — Ambulatory Visit: Admitting: Neurology
